# Patient Record
Sex: Female | Born: 1945
Health system: Southern US, Community
[De-identification: ages and names within clinical notes are randomized; demographics above are authoritative.]

## PROBLEM LIST (undated history)

## (undated) ENCOUNTER — Emergency Department (HOSPITAL_COMMUNITY): Admission: EM | Payer: Medicare Other | Source: Home / Self Care

## (undated) DIAGNOSIS — N183 Chronic kidney disease, stage 3 unspecified: Secondary | ICD-10-CM

## (undated) DIAGNOSIS — Z8673 Personal history of transient ischemic attack (TIA), and cerebral infarction without residual deficits: Secondary | ICD-10-CM

## (undated) DIAGNOSIS — F419 Anxiety disorder, unspecified: Secondary | ICD-10-CM

## (undated) DIAGNOSIS — E119 Type 2 diabetes mellitus without complications: Secondary | ICD-10-CM

## (undated) DIAGNOSIS — Z9889 Other specified postprocedural states: Secondary | ICD-10-CM

## (undated) DIAGNOSIS — Z87442 Personal history of urinary calculi: Secondary | ICD-10-CM

## (undated) DIAGNOSIS — D649 Anemia, unspecified: Secondary | ICD-10-CM

## (undated) DIAGNOSIS — H8109 Meniere's disease, unspecified ear: Secondary | ICD-10-CM

## (undated) DIAGNOSIS — I10 Essential (primary) hypertension: Secondary | ICD-10-CM

## (undated) DIAGNOSIS — J189 Pneumonia, unspecified organism: Secondary | ICD-10-CM

## (undated) DIAGNOSIS — E785 Hyperlipidemia, unspecified: Secondary | ICD-10-CM

## (undated) DIAGNOSIS — J45909 Unspecified asthma, uncomplicated: Secondary | ICD-10-CM

## (undated) DIAGNOSIS — I639 Cerebral infarction, unspecified: Secondary | ICD-10-CM

## (undated) DIAGNOSIS — F329 Major depressive disorder, single episode, unspecified: Secondary | ICD-10-CM

## (undated) DIAGNOSIS — G629 Polyneuropathy, unspecified: Secondary | ICD-10-CM

## (undated) DIAGNOSIS — I499 Cardiac arrhythmia, unspecified: Secondary | ICD-10-CM

## (undated) DIAGNOSIS — R112 Nausea with vomiting, unspecified: Secondary | ICD-10-CM

## (undated) DIAGNOSIS — K219 Gastro-esophageal reflux disease without esophagitis: Secondary | ICD-10-CM

## (undated) DIAGNOSIS — F32A Depression, unspecified: Secondary | ICD-10-CM

## (undated) HISTORY — PX: ECTOPIC PREGNANCY SURGERY: SHX613

## (undated) HISTORY — DX: Gastro-esophageal reflux disease without esophagitis: K21.9

## (undated) HISTORY — DX: Personal history of transient ischemic attack (TIA), and cerebral infarction without residual deficits: Z86.73

## (undated) HISTORY — DX: Essential (primary) hypertension: I10

## (undated) HISTORY — PX: TONSILLECTOMY: SUR1361

## (undated) HISTORY — DX: Cerebral infarction, unspecified: I63.9

## (undated) HISTORY — PX: ABDOMINAL HYSTERECTOMY: SHX81

## (undated) HISTORY — DX: Chronic kidney disease, stage 3 unspecified: N18.30

## (undated) HISTORY — PX: CHOLECYSTECTOMY: SHX55

## (undated) HISTORY — PX: BACK SURGERY: SHX140

## (undated) HISTORY — DX: Type 2 diabetes mellitus without complications: E11.9

## (undated) HISTORY — PX: OTHER SURGICAL HISTORY: SHX169

## (undated) HISTORY — DX: Hyperlipidemia, unspecified: E78.5

## (undated) HISTORY — DX: Major depressive disorder, single episode, unspecified: F32.9

## (undated) HISTORY — DX: Depression, unspecified: F32.A

## (undated) HISTORY — PX: CATARACT EXTRACTION, BILATERAL: SHX1313

## (undated) HISTORY — DX: Chronic kidney disease, stage 3 (moderate): N18.3

## (undated) HISTORY — DX: Polyneuropathy, unspecified: G62.9

---

## 2013-05-11 DIAGNOSIS — Z8673 Personal history of transient ischemic attack (TIA), and cerebral infarction without residual deficits: Secondary | ICD-10-CM

## 2013-05-11 HISTORY — DX: Personal history of transient ischemic attack (TIA), and cerebral infarction without residual deficits: Z86.73

## 2013-06-05 DIAGNOSIS — E782 Mixed hyperlipidemia: Secondary | ICD-10-CM | POA: Diagnosis not present

## 2013-06-05 DIAGNOSIS — E876 Hypokalemia: Secondary | ICD-10-CM | POA: Diagnosis not present

## 2013-06-05 DIAGNOSIS — IMO0001 Reserved for inherently not codable concepts without codable children: Secondary | ICD-10-CM | POA: Diagnosis not present

## 2013-06-13 DIAGNOSIS — IMO0001 Reserved for inherently not codable concepts without codable children: Secondary | ICD-10-CM | POA: Diagnosis not present

## 2013-06-13 DIAGNOSIS — Z23 Encounter for immunization: Secondary | ICD-10-CM | POA: Diagnosis not present

## 2013-06-13 DIAGNOSIS — IMO0002 Reserved for concepts with insufficient information to code with codable children: Secondary | ICD-10-CM | POA: Diagnosis not present

## 2013-06-13 DIAGNOSIS — Z1331 Encounter for screening for depression: Secondary | ICD-10-CM | POA: Diagnosis not present

## 2013-06-13 DIAGNOSIS — K2100 Gastro-esophageal reflux disease with esophagitis, without bleeding: Secondary | ICD-10-CM | POA: Diagnosis not present

## 2013-06-13 DIAGNOSIS — I10 Essential (primary) hypertension: Secondary | ICD-10-CM | POA: Diagnosis not present

## 2013-06-16 DIAGNOSIS — Z1231 Encounter for screening mammogram for malignant neoplasm of breast: Secondary | ICD-10-CM | POA: Diagnosis not present

## 2013-06-20 DIAGNOSIS — G459 Transient cerebral ischemic attack, unspecified: Secondary | ICD-10-CM | POA: Diagnosis not present

## 2013-06-22 DIAGNOSIS — G459 Transient cerebral ischemic attack, unspecified: Secondary | ICD-10-CM | POA: Diagnosis not present

## 2013-06-22 DIAGNOSIS — I658 Occlusion and stenosis of other precerebral arteries: Secondary | ICD-10-CM | POA: Diagnosis not present

## 2013-07-03 DIAGNOSIS — H3530 Unspecified macular degeneration: Secondary | ICD-10-CM | POA: Diagnosis not present

## 2013-07-03 DIAGNOSIS — H2510 Age-related nuclear cataract, unspecified eye: Secondary | ICD-10-CM | POA: Diagnosis not present

## 2013-07-03 DIAGNOSIS — I6350 Cerebral infarction due to unspecified occlusion or stenosis of unspecified cerebral artery: Secondary | ICD-10-CM | POA: Diagnosis not present

## 2013-07-03 DIAGNOSIS — H53469 Homonymous bilateral field defects, unspecified side: Secondary | ICD-10-CM | POA: Diagnosis not present

## 2013-07-03 DIAGNOSIS — E119 Type 2 diabetes mellitus without complications: Secondary | ICD-10-CM | POA: Diagnosis not present

## 2013-07-05 DIAGNOSIS — I6350 Cerebral infarction due to unspecified occlusion or stenosis of unspecified cerebral artery: Secondary | ICD-10-CM | POA: Diagnosis not present

## 2013-07-05 DIAGNOSIS — H53469 Homonymous bilateral field defects, unspecified side: Secondary | ICD-10-CM | POA: Diagnosis not present

## 2013-07-13 DIAGNOSIS — I6350 Cerebral infarction due to unspecified occlusion or stenosis of unspecified cerebral artery: Secondary | ICD-10-CM | POA: Diagnosis not present

## 2013-09-01 DIAGNOSIS — Z1331 Encounter for screening for depression: Secondary | ICD-10-CM | POA: Diagnosis not present

## 2013-09-01 DIAGNOSIS — I10 Essential (primary) hypertension: Secondary | ICD-10-CM | POA: Diagnosis not present

## 2013-09-01 DIAGNOSIS — IMO0001 Reserved for inherently not codable concepts without codable children: Secondary | ICD-10-CM | POA: Diagnosis not present

## 2013-09-01 DIAGNOSIS — K2100 Gastro-esophageal reflux disease with esophagitis, without bleeding: Secondary | ICD-10-CM | POA: Diagnosis not present

## 2013-09-01 DIAGNOSIS — IMO0002 Reserved for concepts with insufficient information to code with codable children: Secondary | ICD-10-CM | POA: Diagnosis not present

## 2013-09-01 DIAGNOSIS — Z23 Encounter for immunization: Secondary | ICD-10-CM | POA: Diagnosis not present

## 2013-12-05 DIAGNOSIS — IMO0002 Reserved for concepts with insufficient information to code with codable children: Secondary | ICD-10-CM | POA: Diagnosis not present

## 2013-12-05 DIAGNOSIS — E782 Mixed hyperlipidemia: Secondary | ICD-10-CM | POA: Diagnosis not present

## 2013-12-05 DIAGNOSIS — I10 Essential (primary) hypertension: Secondary | ICD-10-CM | POA: Diagnosis not present

## 2013-12-05 DIAGNOSIS — IMO0001 Reserved for inherently not codable concepts without codable children: Secondary | ICD-10-CM | POA: Diagnosis not present

## 2013-12-13 DIAGNOSIS — Z1331 Encounter for screening for depression: Secondary | ICD-10-CM | POA: Diagnosis not present

## 2013-12-13 DIAGNOSIS — I10 Essential (primary) hypertension: Secondary | ICD-10-CM | POA: Diagnosis not present

## 2013-12-13 DIAGNOSIS — IMO0001 Reserved for inherently not codable concepts without codable children: Secondary | ICD-10-CM | POA: Diagnosis not present

## 2013-12-13 DIAGNOSIS — K2100 Gastro-esophageal reflux disease with esophagitis, without bleeding: Secondary | ICD-10-CM | POA: Diagnosis not present

## 2013-12-13 DIAGNOSIS — S93609A Unspecified sprain of unspecified foot, initial encounter: Secondary | ICD-10-CM | POA: Diagnosis not present

## 2013-12-13 DIAGNOSIS — IMO0002 Reserved for concepts with insufficient information to code with codable children: Secondary | ICD-10-CM | POA: Diagnosis not present

## 2013-12-22 DIAGNOSIS — B029 Zoster without complications: Secondary | ICD-10-CM | POA: Diagnosis not present

## 2014-01-04 DIAGNOSIS — B0229 Other postherpetic nervous system involvement: Secondary | ICD-10-CM | POA: Diagnosis not present

## 2014-01-11 DIAGNOSIS — B0229 Other postherpetic nervous system involvement: Secondary | ICD-10-CM | POA: Diagnosis not present

## 2014-02-23 DIAGNOSIS — Z23 Encounter for immunization: Secondary | ICD-10-CM | POA: Diagnosis not present

## 2014-04-10 DIAGNOSIS — E1165 Type 2 diabetes mellitus with hyperglycemia: Secondary | ICD-10-CM | POA: Diagnosis not present

## 2014-04-10 DIAGNOSIS — E782 Mixed hyperlipidemia: Secondary | ICD-10-CM | POA: Diagnosis not present

## 2014-04-10 DIAGNOSIS — I10 Essential (primary) hypertension: Secondary | ICD-10-CM | POA: Diagnosis not present

## 2014-04-17 DIAGNOSIS — I10 Essential (primary) hypertension: Secondary | ICD-10-CM | POA: Diagnosis not present

## 2014-04-17 DIAGNOSIS — K210 Gastro-esophageal reflux disease with esophagitis: Secondary | ICD-10-CM | POA: Diagnosis not present

## 2014-04-17 DIAGNOSIS — E782 Mixed hyperlipidemia: Secondary | ICD-10-CM | POA: Diagnosis not present

## 2014-04-17 DIAGNOSIS — F324 Major depressive disorder, single episode, in partial remission: Secondary | ICD-10-CM | POA: Diagnosis not present

## 2014-04-17 DIAGNOSIS — Z9189 Other specified personal risk factors, not elsewhere classified: Secondary | ICD-10-CM | POA: Diagnosis not present

## 2014-04-17 DIAGNOSIS — E1165 Type 2 diabetes mellitus with hyperglycemia: Secondary | ICD-10-CM | POA: Diagnosis not present

## 2014-04-17 DIAGNOSIS — B0229 Other postherpetic nervous system involvement: Secondary | ICD-10-CM | POA: Diagnosis not present

## 2014-04-24 DIAGNOSIS — D1612 Benign neoplasm of short bones of left upper limb: Secondary | ICD-10-CM | POA: Diagnosis not present

## 2014-05-31 DIAGNOSIS — D1612 Benign neoplasm of short bones of left upper limb: Secondary | ICD-10-CM | POA: Diagnosis not present

## 2014-06-29 DIAGNOSIS — D1612 Benign neoplasm of short bones of left upper limb: Secondary | ICD-10-CM | POA: Diagnosis not present

## 2014-06-29 DIAGNOSIS — D1620 Benign neoplasm of long bones of unspecified lower limb: Secondary | ICD-10-CM | POA: Diagnosis not present

## 2014-06-29 DIAGNOSIS — E119 Type 2 diabetes mellitus without complications: Secondary | ICD-10-CM | POA: Diagnosis not present

## 2014-06-29 DIAGNOSIS — K219 Gastro-esophageal reflux disease without esophagitis: Secondary | ICD-10-CM | POA: Diagnosis not present

## 2014-06-29 DIAGNOSIS — Z8673 Personal history of transient ischemic attack (TIA), and cerebral infarction without residual deficits: Secondary | ICD-10-CM | POA: Diagnosis not present

## 2014-06-29 DIAGNOSIS — I10 Essential (primary) hypertension: Secondary | ICD-10-CM | POA: Diagnosis not present

## 2014-06-29 DIAGNOSIS — Z79899 Other long term (current) drug therapy: Secondary | ICD-10-CM | POA: Diagnosis not present

## 2014-06-29 DIAGNOSIS — R2232 Localized swelling, mass and lump, left upper limb: Secondary | ICD-10-CM | POA: Diagnosis not present

## 2014-06-29 DIAGNOSIS — Z8249 Family history of ischemic heart disease and other diseases of the circulatory system: Secondary | ICD-10-CM | POA: Diagnosis not present

## 2014-07-10 DIAGNOSIS — D1612 Benign neoplasm of short bones of left upper limb: Secondary | ICD-10-CM | POA: Diagnosis not present

## 2014-07-10 DIAGNOSIS — Z9889 Other specified postprocedural states: Secondary | ICD-10-CM | POA: Diagnosis not present

## 2014-07-19 DIAGNOSIS — Z1231 Encounter for screening mammogram for malignant neoplasm of breast: Secondary | ICD-10-CM | POA: Diagnosis not present

## 2014-07-23 ENCOUNTER — Encounter (INDEPENDENT_AMBULATORY_CARE_PROVIDER_SITE_OTHER): Payer: Medicare Other | Admitting: Ophthalmology

## 2014-07-23 DIAGNOSIS — E11331 Type 2 diabetes mellitus with moderate nonproliferative diabetic retinopathy with macular edema: Secondary | ICD-10-CM

## 2014-07-23 DIAGNOSIS — H3531 Nonexudative age-related macular degeneration: Secondary | ICD-10-CM

## 2014-07-23 DIAGNOSIS — I10 Essential (primary) hypertension: Secondary | ICD-10-CM | POA: Diagnosis not present

## 2014-07-23 DIAGNOSIS — E11311 Type 2 diabetes mellitus with unspecified diabetic retinopathy with macular edema: Secondary | ICD-10-CM | POA: Diagnosis not present

## 2014-07-23 DIAGNOSIS — H35033 Hypertensive retinopathy, bilateral: Secondary | ICD-10-CM | POA: Diagnosis not present

## 2014-08-01 ENCOUNTER — Other Ambulatory Visit (INDEPENDENT_AMBULATORY_CARE_PROVIDER_SITE_OTHER): Payer: Medicare Other | Admitting: Ophthalmology

## 2014-08-01 DIAGNOSIS — E11311 Type 2 diabetes mellitus with unspecified diabetic retinopathy with macular edema: Secondary | ICD-10-CM

## 2014-08-01 DIAGNOSIS — E11331 Type 2 diabetes mellitus with moderate nonproliferative diabetic retinopathy with macular edema: Secondary | ICD-10-CM

## 2014-08-16 DIAGNOSIS — D1612 Benign neoplasm of short bones of left upper limb: Secondary | ICD-10-CM | POA: Diagnosis not present

## 2014-09-06 DIAGNOSIS — E782 Mixed hyperlipidemia: Secondary | ICD-10-CM | POA: Diagnosis not present

## 2014-09-06 DIAGNOSIS — E1165 Type 2 diabetes mellitus with hyperglycemia: Secondary | ICD-10-CM | POA: Diagnosis not present

## 2014-09-06 DIAGNOSIS — K210 Gastro-esophageal reflux disease with esophagitis: Secondary | ICD-10-CM | POA: Diagnosis not present

## 2014-09-06 DIAGNOSIS — E876 Hypokalemia: Secondary | ICD-10-CM | POA: Diagnosis not present

## 2014-09-06 DIAGNOSIS — I10 Essential (primary) hypertension: Secondary | ICD-10-CM | POA: Diagnosis not present

## 2014-09-13 DIAGNOSIS — I10 Essential (primary) hypertension: Secondary | ICD-10-CM | POA: Diagnosis not present

## 2014-09-13 DIAGNOSIS — E1165 Type 2 diabetes mellitus with hyperglycemia: Secondary | ICD-10-CM | POA: Diagnosis not present

## 2014-09-13 DIAGNOSIS — F331 Major depressive disorder, recurrent, moderate: Secondary | ICD-10-CM | POA: Diagnosis not present

## 2014-09-13 DIAGNOSIS — Z0001 Encounter for general adult medical examination with abnormal findings: Secondary | ICD-10-CM | POA: Diagnosis not present

## 2014-09-13 DIAGNOSIS — E876 Hypokalemia: Secondary | ICD-10-CM | POA: Diagnosis not present

## 2014-09-13 DIAGNOSIS — Z1389 Encounter for screening for other disorder: Secondary | ICD-10-CM | POA: Diagnosis not present

## 2014-09-13 DIAGNOSIS — K219 Gastro-esophageal reflux disease without esophagitis: Secondary | ICD-10-CM | POA: Diagnosis not present

## 2014-09-13 DIAGNOSIS — Z9189 Other specified personal risk factors, not elsewhere classified: Secondary | ICD-10-CM | POA: Diagnosis not present

## 2014-09-13 DIAGNOSIS — Z23 Encounter for immunization: Secondary | ICD-10-CM | POA: Diagnosis not present

## 2014-09-13 DIAGNOSIS — E782 Mixed hyperlipidemia: Secondary | ICD-10-CM | POA: Diagnosis not present

## 2014-12-03 ENCOUNTER — Ambulatory Visit (INDEPENDENT_AMBULATORY_CARE_PROVIDER_SITE_OTHER): Payer: Medicare Other | Admitting: Ophthalmology

## 2014-12-03 DIAGNOSIS — I10 Essential (primary) hypertension: Secondary | ICD-10-CM

## 2014-12-03 DIAGNOSIS — E11339 Type 2 diabetes mellitus with moderate nonproliferative diabetic retinopathy without macular edema: Secondary | ICD-10-CM

## 2014-12-03 DIAGNOSIS — H35033 Hypertensive retinopathy, bilateral: Secondary | ICD-10-CM | POA: Diagnosis not present

## 2014-12-03 DIAGNOSIS — H2513 Age-related nuclear cataract, bilateral: Secondary | ICD-10-CM

## 2014-12-03 DIAGNOSIS — H43813 Vitreous degeneration, bilateral: Secondary | ICD-10-CM | POA: Diagnosis not present

## 2014-12-03 DIAGNOSIS — E11319 Type 2 diabetes mellitus with unspecified diabetic retinopathy without macular edema: Secondary | ICD-10-CM | POA: Diagnosis not present

## 2014-12-11 DIAGNOSIS — E782 Mixed hyperlipidemia: Secondary | ICD-10-CM | POA: Diagnosis not present

## 2014-12-11 DIAGNOSIS — E1165 Type 2 diabetes mellitus with hyperglycemia: Secondary | ICD-10-CM | POA: Diagnosis not present

## 2014-12-11 DIAGNOSIS — I10 Essential (primary) hypertension: Secondary | ICD-10-CM | POA: Diagnosis not present

## 2014-12-11 DIAGNOSIS — E876 Hypokalemia: Secondary | ICD-10-CM | POA: Diagnosis not present

## 2014-12-19 DIAGNOSIS — N183 Chronic kidney disease, stage 3 (moderate): Secondary | ICD-10-CM | POA: Diagnosis not present

## 2014-12-19 DIAGNOSIS — E782 Mixed hyperlipidemia: Secondary | ICD-10-CM | POA: Diagnosis not present

## 2014-12-19 DIAGNOSIS — K219 Gastro-esophageal reflux disease without esophagitis: Secondary | ICD-10-CM | POA: Diagnosis not present

## 2014-12-19 DIAGNOSIS — I10 Essential (primary) hypertension: Secondary | ICD-10-CM | POA: Diagnosis not present

## 2014-12-19 DIAGNOSIS — E1122 Type 2 diabetes mellitus with diabetic chronic kidney disease: Secondary | ICD-10-CM | POA: Diagnosis not present

## 2014-12-19 DIAGNOSIS — F331 Major depressive disorder, recurrent, moderate: Secondary | ICD-10-CM | POA: Diagnosis not present

## 2015-01-10 DIAGNOSIS — M85852 Other specified disorders of bone density and structure, left thigh: Secondary | ICD-10-CM | POA: Diagnosis not present

## 2015-01-10 DIAGNOSIS — M810 Age-related osteoporosis without current pathological fracture: Secondary | ICD-10-CM | POA: Diagnosis not present

## 2015-01-10 DIAGNOSIS — Z79899 Other long term (current) drug therapy: Secondary | ICD-10-CM | POA: Diagnosis not present

## 2015-01-10 DIAGNOSIS — Z8262 Family history of osteoporosis: Secondary | ICD-10-CM | POA: Diagnosis not present

## 2015-01-10 DIAGNOSIS — Z7982 Long term (current) use of aspirin: Secondary | ICD-10-CM | POA: Diagnosis not present

## 2015-01-10 DIAGNOSIS — Z780 Asymptomatic menopausal state: Secondary | ICD-10-CM | POA: Diagnosis not present

## 2015-01-10 DIAGNOSIS — M8589 Other specified disorders of bone density and structure, multiple sites: Secondary | ICD-10-CM | POA: Diagnosis not present

## 2015-01-10 DIAGNOSIS — E119 Type 2 diabetes mellitus without complications: Secondary | ICD-10-CM | POA: Diagnosis not present

## 2015-01-10 DIAGNOSIS — M85851 Other specified disorders of bone density and structure, right thigh: Secondary | ICD-10-CM | POA: Diagnosis not present

## 2015-03-27 DIAGNOSIS — Z23 Encounter for immunization: Secondary | ICD-10-CM | POA: Diagnosis not present

## 2015-04-19 DIAGNOSIS — K210 Gastro-esophageal reflux disease with esophagitis: Secondary | ICD-10-CM | POA: Diagnosis not present

## 2015-04-19 DIAGNOSIS — N183 Chronic kidney disease, stage 3 (moderate): Secondary | ICD-10-CM | POA: Diagnosis not present

## 2015-04-19 DIAGNOSIS — E782 Mixed hyperlipidemia: Secondary | ICD-10-CM | POA: Diagnosis not present

## 2015-04-19 DIAGNOSIS — I10 Essential (primary) hypertension: Secondary | ICD-10-CM | POA: Diagnosis not present

## 2015-04-19 DIAGNOSIS — E1122 Type 2 diabetes mellitus with diabetic chronic kidney disease: Secondary | ICD-10-CM | POA: Diagnosis not present

## 2015-04-19 DIAGNOSIS — E876 Hypokalemia: Secondary | ICD-10-CM | POA: Diagnosis not present

## 2015-04-26 DIAGNOSIS — N183 Chronic kidney disease, stage 3 (moderate): Secondary | ICD-10-CM | POA: Diagnosis not present

## 2015-04-26 DIAGNOSIS — E782 Mixed hyperlipidemia: Secondary | ICD-10-CM | POA: Diagnosis not present

## 2015-04-26 DIAGNOSIS — F331 Major depressive disorder, recurrent, moderate: Secondary | ICD-10-CM | POA: Diagnosis not present

## 2015-04-26 DIAGNOSIS — I10 Essential (primary) hypertension: Secondary | ICD-10-CM | POA: Diagnosis not present

## 2015-04-26 DIAGNOSIS — K219 Gastro-esophageal reflux disease without esophagitis: Secondary | ICD-10-CM | POA: Diagnosis not present

## 2015-04-26 DIAGNOSIS — E1122 Type 2 diabetes mellitus with diabetic chronic kidney disease: Secondary | ICD-10-CM | POA: Diagnosis not present

## 2015-05-19 DIAGNOSIS — Z7984 Long term (current) use of oral hypoglycemic drugs: Secondary | ICD-10-CM | POA: Diagnosis not present

## 2015-05-19 DIAGNOSIS — D649 Anemia, unspecified: Secondary | ICD-10-CM | POA: Diagnosis present

## 2015-05-19 DIAGNOSIS — J158 Pneumonia due to other specified bacteria: Secondary | ICD-10-CM | POA: Diagnosis not present

## 2015-05-19 DIAGNOSIS — R000 Tachycardia, unspecified: Secondary | ICD-10-CM | POA: Diagnosis not present

## 2015-05-19 DIAGNOSIS — I10 Essential (primary) hypertension: Secondary | ICD-10-CM | POA: Diagnosis present

## 2015-05-19 DIAGNOSIS — E119 Type 2 diabetes mellitus without complications: Secondary | ICD-10-CM | POA: Diagnosis not present

## 2015-05-19 DIAGNOSIS — E785 Hyperlipidemia, unspecified: Secondary | ICD-10-CM | POA: Diagnosis present

## 2015-05-19 DIAGNOSIS — I447 Left bundle-branch block, unspecified: Secondary | ICD-10-CM | POA: Diagnosis not present

## 2015-05-19 DIAGNOSIS — R05 Cough: Secondary | ICD-10-CM | POA: Diagnosis not present

## 2015-05-19 DIAGNOSIS — E876 Hypokalemia: Secondary | ICD-10-CM | POA: Diagnosis not present

## 2015-05-19 DIAGNOSIS — J189 Pneumonia, unspecified organism: Secondary | ICD-10-CM | POA: Diagnosis not present

## 2015-05-19 DIAGNOSIS — K219 Gastro-esophageal reflux disease without esophagitis: Secondary | ICD-10-CM | POA: Diagnosis present

## 2015-05-19 DIAGNOSIS — J40 Bronchitis, not specified as acute or chronic: Secondary | ICD-10-CM | POA: Diagnosis not present

## 2015-05-19 DIAGNOSIS — Z7982 Long term (current) use of aspirin: Secondary | ICD-10-CM | POA: Diagnosis not present

## 2015-05-19 DIAGNOSIS — Z79899 Other long term (current) drug therapy: Secondary | ICD-10-CM | POA: Diagnosis not present

## 2015-05-19 DIAGNOSIS — Z8673 Personal history of transient ischemic attack (TIA), and cerebral infarction without residual deficits: Secondary | ICD-10-CM | POA: Diagnosis not present

## 2015-05-19 DIAGNOSIS — R509 Fever, unspecified: Secondary | ICD-10-CM | POA: Diagnosis not present

## 2015-05-19 DIAGNOSIS — E871 Hypo-osmolality and hyponatremia: Secondary | ICD-10-CM | POA: Diagnosis not present

## 2015-05-19 DIAGNOSIS — R0602 Shortness of breath: Secondary | ICD-10-CM | POA: Diagnosis not present

## 2015-06-05 ENCOUNTER — Ambulatory Visit (INDEPENDENT_AMBULATORY_CARE_PROVIDER_SITE_OTHER): Payer: Medicare Other | Admitting: Ophthalmology

## 2015-06-05 DIAGNOSIS — J13 Pneumonia due to Streptococcus pneumoniae: Secondary | ICD-10-CM | POA: Diagnosis not present

## 2015-06-05 DIAGNOSIS — E871 Hypo-osmolality and hyponatremia: Secondary | ICD-10-CM | POA: Diagnosis not present

## 2015-06-05 DIAGNOSIS — E876 Hypokalemia: Secondary | ICD-10-CM | POA: Diagnosis not present

## 2015-06-13 ENCOUNTER — Ambulatory Visit (INDEPENDENT_AMBULATORY_CARE_PROVIDER_SITE_OTHER): Payer: Medicare Other | Admitting: Ophthalmology

## 2015-06-13 DIAGNOSIS — H2513 Age-related nuclear cataract, bilateral: Secondary | ICD-10-CM

## 2015-06-13 DIAGNOSIS — E113311 Type 2 diabetes mellitus with moderate nonproliferative diabetic retinopathy with macular edema, right eye: Secondary | ICD-10-CM | POA: Diagnosis not present

## 2015-06-13 DIAGNOSIS — H43813 Vitreous degeneration, bilateral: Secondary | ICD-10-CM

## 2015-06-13 DIAGNOSIS — E11311 Type 2 diabetes mellitus with unspecified diabetic retinopathy with macular edema: Secondary | ICD-10-CM | POA: Diagnosis not present

## 2015-06-13 DIAGNOSIS — H35033 Hypertensive retinopathy, bilateral: Secondary | ICD-10-CM

## 2015-06-13 DIAGNOSIS — I10 Essential (primary) hypertension: Secondary | ICD-10-CM | POA: Diagnosis not present

## 2015-06-13 DIAGNOSIS — H353112 Nonexudative age-related macular degeneration, right eye, intermediate dry stage: Secondary | ICD-10-CM

## 2015-06-24 DIAGNOSIS — J13 Pneumonia due to Streptococcus pneumoniae: Secondary | ICD-10-CM | POA: Diagnosis not present

## 2015-06-24 DIAGNOSIS — E871 Hypo-osmolality and hyponatremia: Secondary | ICD-10-CM | POA: Diagnosis not present

## 2015-06-24 DIAGNOSIS — E876 Hypokalemia: Secondary | ICD-10-CM | POA: Diagnosis not present

## 2015-07-05 DIAGNOSIS — E1122 Type 2 diabetes mellitus with diabetic chronic kidney disease: Secondary | ICD-10-CM | POA: Diagnosis not present

## 2015-07-11 DIAGNOSIS — E113391 Type 2 diabetes mellitus with moderate nonproliferative diabetic retinopathy without macular edema, right eye: Secondary | ICD-10-CM | POA: Diagnosis not present

## 2015-07-11 DIAGNOSIS — Z794 Long term (current) use of insulin: Secondary | ICD-10-CM | POA: Diagnosis not present

## 2015-07-11 DIAGNOSIS — H353112 Nonexudative age-related macular degeneration, right eye, intermediate dry stage: Secondary | ICD-10-CM | POA: Diagnosis not present

## 2015-07-11 DIAGNOSIS — E113392 Type 2 diabetes mellitus with moderate nonproliferative diabetic retinopathy without macular edema, left eye: Secondary | ICD-10-CM | POA: Diagnosis not present

## 2015-07-11 DIAGNOSIS — H25012 Cortical age-related cataract, left eye: Secondary | ICD-10-CM | POA: Diagnosis not present

## 2015-07-11 DIAGNOSIS — H31012 Macula scars of posterior pole (postinflammatory) (post-traumatic), left eye: Secondary | ICD-10-CM | POA: Diagnosis not present

## 2015-07-11 DIAGNOSIS — H2512 Age-related nuclear cataract, left eye: Secondary | ICD-10-CM | POA: Diagnosis not present

## 2015-08-20 DIAGNOSIS — H2512 Age-related nuclear cataract, left eye: Secondary | ICD-10-CM | POA: Diagnosis not present

## 2015-08-22 DIAGNOSIS — E1122 Type 2 diabetes mellitus with diabetic chronic kidney disease: Secondary | ICD-10-CM | POA: Diagnosis not present

## 2015-08-22 DIAGNOSIS — E871 Hypo-osmolality and hyponatremia: Secondary | ICD-10-CM | POA: Diagnosis not present

## 2015-08-22 DIAGNOSIS — I10 Essential (primary) hypertension: Secondary | ICD-10-CM | POA: Diagnosis not present

## 2015-08-22 DIAGNOSIS — E876 Hypokalemia: Secondary | ICD-10-CM | POA: Diagnosis not present

## 2015-08-22 DIAGNOSIS — E782 Mixed hyperlipidemia: Secondary | ICD-10-CM | POA: Diagnosis not present

## 2015-08-22 DIAGNOSIS — K210 Gastro-esophageal reflux disease with esophagitis: Secondary | ICD-10-CM | POA: Diagnosis not present

## 2015-08-22 LAB — HEMOGLOBIN A1C: Hemoglobin A1C: 8.4

## 2015-08-27 DIAGNOSIS — F331 Major depressive disorder, recurrent, moderate: Secondary | ICD-10-CM | POA: Diagnosis not present

## 2015-08-27 DIAGNOSIS — E782 Mixed hyperlipidemia: Secondary | ICD-10-CM | POA: Diagnosis not present

## 2015-08-27 DIAGNOSIS — I10 Essential (primary) hypertension: Secondary | ICD-10-CM | POA: Diagnosis not present

## 2015-08-27 DIAGNOSIS — E1122 Type 2 diabetes mellitus with diabetic chronic kidney disease: Secondary | ICD-10-CM | POA: Diagnosis not present

## 2015-08-27 DIAGNOSIS — K219 Gastro-esophageal reflux disease without esophagitis: Secondary | ICD-10-CM | POA: Diagnosis not present

## 2015-08-27 DIAGNOSIS — N183 Chronic kidney disease, stage 3 (moderate): Secondary | ICD-10-CM | POA: Diagnosis not present

## 2015-09-06 DIAGNOSIS — Z1231 Encounter for screening mammogram for malignant neoplasm of breast: Secondary | ICD-10-CM | POA: Diagnosis not present

## 2015-09-19 DIAGNOSIS — A084 Viral intestinal infection, unspecified: Secondary | ICD-10-CM | POA: Diagnosis not present

## 2015-09-19 DIAGNOSIS — I10 Essential (primary) hypertension: Secondary | ICD-10-CM | POA: Diagnosis not present

## 2015-09-19 DIAGNOSIS — F329 Major depressive disorder, single episode, unspecified: Secondary | ICD-10-CM | POA: Diagnosis not present

## 2015-09-19 DIAGNOSIS — Z79899 Other long term (current) drug therapy: Secondary | ICD-10-CM | POA: Diagnosis not present

## 2015-09-19 DIAGNOSIS — E1142 Type 2 diabetes mellitus with diabetic polyneuropathy: Secondary | ICD-10-CM | POA: Diagnosis not present

## 2015-09-19 DIAGNOSIS — Z7984 Long term (current) use of oral hypoglycemic drugs: Secondary | ICD-10-CM | POA: Diagnosis not present

## 2015-09-19 DIAGNOSIS — Z7982 Long term (current) use of aspirin: Secondary | ICD-10-CM | POA: Diagnosis not present

## 2015-09-19 DIAGNOSIS — Z8673 Personal history of transient ischemic attack (TIA), and cerebral infarction without residual deficits: Secondary | ICD-10-CM | POA: Diagnosis not present

## 2015-09-19 DIAGNOSIS — H2511 Age-related nuclear cataract, right eye: Secondary | ICD-10-CM | POA: Diagnosis not present

## 2015-09-19 DIAGNOSIS — E785 Hyperlipidemia, unspecified: Secondary | ICD-10-CM | POA: Diagnosis not present

## 2015-09-19 DIAGNOSIS — D649 Anemia, unspecified: Secondary | ICD-10-CM | POA: Diagnosis not present

## 2015-09-19 DIAGNOSIS — R112 Nausea with vomiting, unspecified: Secondary | ICD-10-CM | POA: Diagnosis not present

## 2015-09-19 DIAGNOSIS — K219 Gastro-esophageal reflux disease without esophagitis: Secondary | ICD-10-CM | POA: Diagnosis not present

## 2015-09-19 DIAGNOSIS — H25011 Cortical age-related cataract, right eye: Secondary | ICD-10-CM | POA: Diagnosis not present

## 2015-09-19 DIAGNOSIS — E11649 Type 2 diabetes mellitus with hypoglycemia without coma: Secondary | ICD-10-CM | POA: Diagnosis not present

## 2015-09-19 DIAGNOSIS — Z794 Long term (current) use of insulin: Secondary | ICD-10-CM | POA: Diagnosis not present

## 2015-09-20 DIAGNOSIS — E11649 Type 2 diabetes mellitus with hypoglycemia without coma: Secondary | ICD-10-CM | POA: Diagnosis not present

## 2015-10-21 DIAGNOSIS — E1122 Type 2 diabetes mellitus with diabetic chronic kidney disease: Secondary | ICD-10-CM | POA: Diagnosis not present

## 2015-11-06 ENCOUNTER — Ambulatory Visit (INDEPENDENT_AMBULATORY_CARE_PROVIDER_SITE_OTHER): Payer: Medicare Other | Admitting: "Endocrinology

## 2015-11-06 ENCOUNTER — Encounter: Payer: Self-pay | Admitting: "Endocrinology

## 2015-11-06 VITALS — BP 147/77 | HR 74 | Ht 65.5 in | Wt 173.0 lb

## 2015-11-06 DIAGNOSIS — E785 Hyperlipidemia, unspecified: Secondary | ICD-10-CM | POA: Insufficient documentation

## 2015-11-06 DIAGNOSIS — I10 Essential (primary) hypertension: Secondary | ICD-10-CM

## 2015-11-06 DIAGNOSIS — E1159 Type 2 diabetes mellitus with other circulatory complications: Secondary | ICD-10-CM

## 2015-11-06 MED ORDER — GLUCOSE BLOOD VI STRP: ORAL_STRIP | Status: DC

## 2015-11-06 NOTE — Progress Notes (Signed)
Subjective:    Patient ID: Angela Harris, female    DOB: 1945/05/16. Patient is being seen in consultation for management of diabetes requested by  Gar Ponto, MD  Past Medical History  Diagnosis Date  . Diabetes mellitus, type II ([place])   . Stroke ([place])   . Hypertension    Past Surgical History  Procedure Laterality Date  . Back surgery    . Cholecystectomy    . Tonsillectomy    . Abdominal hysterectomy     Social History   Social History  . Marital Status: Divorced    Spouse Name: N/A  . Number of Children: N/A  . Years of Education: N/A   Social History Main Topics  . Smoking status: Never Smoker   . Smokeless tobacco: None  . Alcohol Use: No  . Drug Use: No  . Sexual Activity: Not Asked   Other Topics Concern  . None   Social History Narrative  . None   Outpatient Encounter Prescriptions as of 11/06/2015  Medication Sig  . amLODipine (NORVASC) 10 MG tablet Take 10 mg by mouth daily.  Marland Kitchen aspirin 81 MG tablet Take 81 mg by mouth daily.  Marland Kitchen atenolol (TENORMIN) 50 MG tablet Take 50 mg by mouth daily.  . Calcium Citrate-Vitamin D (CALCIUM + D PO) Take by mouth.  . gabapentin (NEURONTIN) 300 MG capsule Take 300 mg by mouth 2 (two) times daily.  . insulin NPH-regular Human (NOVOLIN 70/30) (70-30) 100 UNIT/ML injection Inject 30 Units into the skin 2 (two) times daily with a meal.  . lisinopril-hydrochlorothiazide (PRINZIDE,ZESTORETIC) 20-12.5 MG tablet Take 1 tablet by mouth daily.  . metFORMIN (GLUCOPHAGE) 500 MG tablet Take 500 mg by mouth 2 (two) times daily with a meal.  . pantoprazole (PROTONIX) 40 MG tablet Take 40 mg by mouth daily.  . simvastatin (ZOCOR) 40 MG tablet Take 40 mg by mouth daily.  Marland Kitchen venlafaxine XR (EFFEXOR-XR) 75 MG 24 hr capsule Take 75 mg by mouth daily with breakfast.  . glucose blood (ONE TOUCH ULTRA TEST) test strip Use as instructed   No facility-administered encounter medications on file as of 11/06/2015.   ALLERGIES: No Known  Allergies VACCINATION STATUS:  There is no immunization history on file for this patient.  Diabetes She presents for her initial diabetic visit. She has type 2 diabetes mellitus. Onset time: She was diagnosed at approximate age of 46 years. Her disease course has been worsening. There are no hypoglycemic associated symptoms. Pertinent negatives for hypoglycemia include no confusion, headaches, pallor or seizures. Associated symptoms include fatigue, polydipsia and polyuria. Pertinent negatives for diabetes include no chest pain and no polyphagia. There are no hypoglycemic complications. Symptoms are worsening. Diabetic complications include a CVA. Risk factors for coronary artery disease include diabetes mellitus, dyslipidemia, hypertension and sedentary lifestyle. Current diabetic treatment includes insulin injections and oral agent (monotherapy). She is compliant with treatment some of the time. Her weight is increasing steadily. She is following a generally unhealthy diet. When asked about meal planning, she reported none. She has not had a previous visit with a dietitian. Blood glucose monitoring compliance is inadequate. Home blood sugar record trend: She did not bring her meter nor logs to review today. An ACE inhibitor/angiotensin II receptor blocker is being taken.  Hyperlipidemia This is a chronic problem. The current episode started more than 1 year ago. Exacerbating diseases include diabetes. Pertinent negatives include no chest pain, myalgias or shortness of breath. Current antihyperlipidemic treatment includes statins. Risk  factors for coronary artery disease include diabetes mellitus, dyslipidemia and hypertension.  Hypertension This is a chronic problem. The current episode started more than 1 year ago. Pertinent negatives include no chest pain, headaches, palpitations or shortness of breath. Risk factors for coronary artery disease include diabetes mellitus, dyslipidemia and sedentary  lifestyle. Past treatments include ACE inhibitors. Hypertensive end-organ damage includes CVA.      Review of Systems  Constitutional: Positive for fatigue. Negative for fever, chills and unexpected weight change.  HENT: Negative for trouble swallowing and voice change.   Eyes: Negative for visual disturbance.  Respiratory: Negative for cough, shortness of breath and wheezing.   Cardiovascular: Negative for chest pain, palpitations and leg swelling.  Gastrointestinal: Negative for nausea, vomiting and diarrhea.  Endocrine: Positive for polydipsia and polyuria. Negative for cold intolerance, heat intolerance and polyphagia.  Musculoskeletal: Negative for myalgias and arthralgias.  Skin: Negative for color change, pallor, rash and wound.  Neurological: Negative for seizures and headaches.  Psychiatric/Behavioral: Negative for suicidal ideas and confusion.    Objective:    BP 147/77 mmHg  Pulse 74  Ht 5' 5.5" (1.664 m)  Wt 173 lb (78.472 kg)  BMI 28.34 kg/m2  Wt Readings from Last 3 Encounters:  11/06/15 173 lb (78.472 kg)    Physical Exam  Constitutional: She is oriented to person, place, and time. She appears well-developed.  HENT:  Head: Normocephalic and atraumatic.  Eyes: EOM are normal.  Neck: Normal range of motion. Neck supple. No tracheal deviation present. No thyromegaly present.  Cardiovascular: Normal rate and regular rhythm.   Pulmonary/Chest: Effort normal and breath sounds normal.  Abdominal: Soft. Bowel sounds are normal. There is no tenderness. There is no guarding.  Musculoskeletal: Normal range of motion. She exhibits no edema.  Neurological: She is alert and oriented to person, place, and time. She has normal reflexes. No cranial nerve deficit. Coordination normal.  Skin: Skin is warm and dry. No rash noted. No erythema. No pallor.  Psychiatric: She has a normal mood and affect. Judgment normal.     Assessment & Plan:   1. Type 2 diabetes mellitus with  vascular disease ([place])   - Patient has currently uncontrolled symptomatic type 2 DM since  70 years of age,  with most recent A1c of 8.4 %. Recent labs reviewed.   Her diabetes is complicated by CVA and patient remains at a high risk for more acute and chronic complications of diabetes which include CAD, CVA, CKD, retinopathy, and neuropathy. These are all discussed in detail with the patient.  - I have counseled the patient on diet management and weight loss, by adopting a carbohydrate restricted/protein rich diet.  - Suggestion is made for patient to avoid simple carbohydrates   from their diet including Cakes , Desserts, Ice Cream,  Soda (  diet and regular) , Sweet Tea , Candies,  Chips, Cookies, Artificial Sweeteners,   and "Sugar-free" Products . This will help patient to have stable blood glucose profile and potentially avoid unintended weight gain.  - I encouraged the patient to switch to  unprocessed or minimally processed complex starch and increased protein intake (animal or plant source), fruits, and vegetables.  - Patient is advised to stick to a routine mealtimes to eat 3 meals  a day and avoid unnecessary snacks ( to snack only to correct hypoglycemia).  - The patient will be scheduled with Jearld Fenton, RDN, CDE for individualized DM education.  - I have approached patient with the following  individualized plan to manage diabetes and patient agrees:   - She will be allowed to continue to use her premixed insulin, Novolin 70/30, which reportedly was chosen due to cost issues. -  I  will continue Novolin 70/30   30 units with breakfast and 30 units with supper for pre-meal blood glucose above 90 mg/dL, associated with monitoring of blood glucose before meals and at bedtime for one week and return with meter and logs for reevaluation.   - Patient is warned not to take insulin without proper monitoring per orders. -Adjustment parameters are given for hypo and hyperglycemia in  writing. -Patient is encouraged to call clinic for blood glucose levels less than 70 or above 300 mg /dl. - I will  lower metformin to 500 mg by mouth twice a day due to mild CK D,  therapeutically suitable for patient..  - If her glycemic profile is significantly above target, she will be considered for insulin analogs for basal/bolus utilization.   - Patient specific target  A1c;  LDL, HDL, Triglycerides, and  Waist Circumference were discussed in detail.  2) BP/HTN: Controlled. Continue current medications including ACEI/ARB. 3) Lipids/HPL:  Controlled with LDL 47, continue statins. 4)  Weight/Diet: CDE Consult will be initiated , exercise, and detailed carbohydrates information provided.  5) Chronic Care/Health Maintenance:  -Patient is on ACEI/ARB and Statin medications and encouraged to continue to follow up with Ophthalmology, Podiatrist at least yearly or according to recommendations, and advised to   stay away from smoking. I have recommended yearly flu vaccine and pneumonia vaccination at least every 5 years; moderate intensity exercise for up to 150 minutes weekly; and  sleep for at least 7 hours a day.  - 60 minutes of time was spent on the care of this patient , 50% of which was applied for counseling on diabetes complications and their preventions.  - Patient to bring meter and  blood glucose logs during their next visit.   - I advised patient to maintain close follow up with Gar Ponto, MD for primary care needs.  Follow up plan: - Return in about 1 week (around 11/13/2015) for follow up with meter and logs- no labs.  Glade Lloyd, MD Phone: [phone]  Fax: [phone]   11/06/2015, 4:03 PM

## 2015-11-06 NOTE — Patient Instructions (Signed)

## 2015-11-08 DIAGNOSIS — J988 Other specified respiratory disorders: Secondary | ICD-10-CM | POA: Diagnosis not present

## 2015-11-08 DIAGNOSIS — R05 Cough: Secondary | ICD-10-CM | POA: Diagnosis not present

## 2015-11-15 ENCOUNTER — Encounter: Payer: Self-pay | Admitting: "Endocrinology

## 2015-11-15 ENCOUNTER — Ambulatory Visit (INDEPENDENT_AMBULATORY_CARE_PROVIDER_SITE_OTHER): Payer: Medicare Other | Admitting: "Endocrinology

## 2015-11-15 VITALS — BP 130/68 | HR 80 | Ht 65.5 in | Wt 174.0 lb

## 2015-11-15 DIAGNOSIS — E1159 Type 2 diabetes mellitus with other circulatory complications: Secondary | ICD-10-CM | POA: Diagnosis not present

## 2015-11-15 DIAGNOSIS — E785 Hyperlipidemia, unspecified: Secondary | ICD-10-CM | POA: Diagnosis not present

## 2015-11-15 DIAGNOSIS — I10 Essential (primary) hypertension: Secondary | ICD-10-CM | POA: Diagnosis not present

## 2015-11-15 NOTE — Progress Notes (Signed)
Subjective:    Patient ID: Angela Harris, female    DOB: Jun 05, 1945. Patient is being seen in f/u for management of diabetes requested by  Gar Ponto, MD  Past Medical History  Diagnosis Date  . Diabetes mellitus, type II ([place])   . Stroke ([place])   . Hypertension    Past Surgical History  Procedure Laterality Date  . Back surgery    . Cholecystectomy    . Tonsillectomy    . Abdominal hysterectomy     Social History   Social History  . Marital Status: Divorced    Spouse Name: N/A  . Number of Children: N/A  . Years of Education: N/A   Social History Main Topics  . Smoking status: Never Smoker   . Smokeless tobacco: None  . Alcohol Use: No  . Drug Use: No  . Sexual Activity: Not Asked   Other Topics Concern  . None   Social History Narrative   Outpatient Encounter Prescriptions as of 11/15/2015  Medication Sig  . amLODipine (NORVASC) 10 MG tablet Take 10 mg by mouth daily.  Marland Kitchen aspirin 81 MG tablet Take 81 mg by mouth daily.  Marland Kitchen atenolol (TENORMIN) 50 MG tablet Take 50 mg by mouth daily.  . Calcium Citrate-Vitamin D (CALCIUM + D PO) Take by mouth.  . gabapentin (NEURONTIN) 300 MG capsule Take 300 mg by mouth 2 (two) times daily.  Marland Kitchen glucose blood (ONE TOUCH ULTRA TEST) test strip Use as instructed  . insulin NPH-regular Human (NOVOLIN 70/30) (70-30) 100 UNIT/ML injection Inject 30-40 Units into the skin 2 (two) times daily with a meal. Inject 40 units with breakfast and 30 units with supper  . lisinopril-hydrochlorothiazide (PRINZIDE,ZESTORETIC) 20-12.5 MG tablet Take 2 tablets by mouth daily.   . metFORMIN (GLUCOPHAGE) 500 MG tablet Take 500 mg by mouth 2 (two) times daily with a meal.  . pantoprazole (PROTONIX) 40 MG tablet Take 40 mg by mouth 2 (two) times daily.   . simvastatin (ZOCOR) 40 MG tablet Take 40 mg by mouth daily.  Marland Kitchen venlafaxine XR (EFFEXOR-XR) 75 MG 24 hr capsule Take 75 mg by mouth daily with breakfast.   No facility-administered encounter  medications on file as of 11/15/2015.   ALLERGIES: No Known Allergies VACCINATION STATUS:  There is no immunization history on file for this patient.  Diabetes She presents for her follow-up diabetic visit. She has type 2 diabetes mellitus. Onset time: She was diagnosed at approximate age of 6 years. Her disease course has been improving. There are no hypoglycemic associated symptoms. Pertinent negatives for hypoglycemia include no confusion, headaches, pallor or seizures. Associated symptoms include fatigue, polydipsia and polyuria. Pertinent negatives for diabetes include no chest pain and no polyphagia. There are no hypoglycemic complications. Symptoms are improving. Diabetic complications include a CVA. Risk factors for coronary artery disease include diabetes mellitus, dyslipidemia, hypertension and sedentary lifestyle. Current diabetic treatment includes insulin injections and oral agent (monotherapy). She is compliant with treatment some of the time. Her weight is stable. She is following a generally unhealthy diet. When asked about meal planning, she reported none. She has not had a previous visit with a dietitian. Blood glucose monitoring compliance is inadequate. Her breakfast blood glucose range is generally 140-180 mg/dl. Her lunch blood glucose range is generally 140-180 mg/dl. Her dinner blood glucose range is generally 180-200 mg/dl. Her overall blood glucose range is 180-200 mg/dl. An ACE inhibitor/angiotensin II receptor blocker is being taken.  Hyperlipidemia This is a chronic problem.  The current episode started more than 1 year ago. Exacerbating diseases include diabetes. Pertinent negatives include no chest pain, myalgias or shortness of breath. Current antihyperlipidemic treatment includes statins. Risk factors for coronary artery disease include diabetes mellitus, dyslipidemia and hypertension.  Hypertension This is a chronic problem. The current episode started more than 1 year  ago. Pertinent negatives include no chest pain, headaches, palpitations or shortness of breath. Risk factors for coronary artery disease include diabetes mellitus, dyslipidemia and sedentary lifestyle. Past treatments include ACE inhibitors. Hypertensive end-organ damage includes CVA.      Review of Systems  Constitutional: Positive for fatigue. Negative for fever, chills and unexpected weight change.  HENT: Negative for trouble swallowing and voice change.   Eyes: Negative for visual disturbance.  Respiratory: Negative for cough, shortness of breath and wheezing.   Cardiovascular: Negative for chest pain, palpitations and leg swelling.  Gastrointestinal: Negative for nausea, vomiting and diarrhea.  Endocrine: Positive for polydipsia and polyuria. Negative for cold intolerance, heat intolerance and polyphagia.  Musculoskeletal: Negative for myalgias and arthralgias.  Skin: Negative for color change, pallor, rash and wound.  Neurological: Negative for seizures and headaches.  Psychiatric/Behavioral: Negative for suicidal ideas and confusion.    Objective:    BP 130/68 mmHg  Pulse 80  Ht 5' 5.5" (1.664 m)  Wt 174 lb (78.926 kg)  BMI 28.50 kg/m2  Wt Readings from Last 3 Encounters:  11/15/15 174 lb (78.926 kg)  11/06/15 173 lb (78.472 kg)    Physical Exam  Constitutional: She is oriented to person, place, and time. She appears well-developed.  HENT:  Head: Normocephalic and atraumatic.  Eyes: EOM are normal.  Neck: Normal range of motion. Neck supple. No tracheal deviation present. No thyromegaly present.  Cardiovascular: Normal rate and regular rhythm.   Pulmonary/Chest: Effort normal and breath sounds normal.  Abdominal: Soft. Bowel sounds are normal. There is no tenderness. There is no guarding.  Musculoskeletal: Normal range of motion. She exhibits no edema.  Neurological: She is alert and oriented to person, place, and time. She has normal reflexes. No cranial nerve deficit.  Coordination normal.  Skin: Skin is warm and dry. No rash noted. No erythema. No pallor.  Psychiatric: She has a normal mood and affect. Judgment normal.     Assessment & Plan:   1. Type 2 diabetes mellitus with vascular disease ([place])  - Patient has currently uncontrolled symptomatic type 2 DM since  70 years of age,  with most recent A1c of 8.4 %. Recent labs reviewed.   Her diabetes is complicated by CVA and patient remains at a high risk for more acute and chronic complications of diabetes which include CAD, CVA, CKD, retinopathy, and neuropathy. These are all discussed in detail with the patient.  - I have counseled the patient on diet management and weight loss, by adopting a carbohydrate restricted/protein rich diet.  - Suggestion is made for patient to avoid simple carbohydrates   from their diet including Cakes , Desserts, Ice Cream,  Soda (  diet and regular) , Sweet Tea , Candies,  Chips, Cookies, Artificial Sweeteners,   and "Sugar-free" Products . This will help patient to have stable blood glucose profile and potentially avoid unintended weight gain.  - I encouraged the patient to switch to  unprocessed or minimally processed complex starch and increased protein intake (animal or plant source), fruits, and vegetables.  - Patient is advised to stick to a routine mealtimes to eat 3 meals  a day and  avoid unnecessary snacks ( to snack only to correct hypoglycemia).  - The patient will be scheduled with Jearld Fenton, RDN, CDE for individualized DM education.  - I have approached patient with the following individualized plan to manage diabetes and patient agrees:   - She will be allowed to continue to use her premixed insulin, Novolin 70/30, which reportedly was chosen due to cost issues. -  I  will continue Novolin 70/30 Increase morning dose to 40 units with breakfast and keep evening dose at 30 units with supper for pre-meal blood glucose above 90 mg/dL, associated with  monitoring of blood glucose before meals and at bedtime for one week and return with meter and logs for reevaluation.   - Patient is warned not to take insulin without proper monitoring per orders. -Adjustment parameters are given for hypo and hyperglycemia in writing. -Patient is encouraged to call clinic for blood glucose levels less than 70 or above 300 mg /dl. - I will  lower metformin to 500 mg by mouth twice a day due to mild CK D,  therapeutically suitable for patient..  - If her glycemic profile is significantly above target, she will be considered for insulin analogs for basal/bolus utilization.   - Patient specific target  A1c;  LDL, HDL, Triglycerides, and  Waist Circumference were discussed in detail.  2) BP/HTN: Controlled. Continue current medications including ACEI/ARB. 3) Lipids/HPL:  Controlled with LDL 47, continue statins. 4)  Weight/Diet: CDE Consult will be initiated , exercise, and detailed carbohydrates information provided.  5) Chronic Care/Health Maintenance:  -Patient is on ACEI/ARB and Statin medications and encouraged to continue to follow up with Ophthalmology, Podiatrist at least yearly or according to recommendations, and advised to   stay away from smoking. I have recommended yearly flu vaccine and pneumonia vaccination at least every 5 years; moderate intensity exercise for up to 150 minutes weekly; and  sleep for at least 7 hours a day.  - 60 minutes of time was spent on the care of this patient , 50% of which was applied for counseling on diabetes complications and their preventions.  - Patient to bring meter and  blood glucose logs during their next visit.   - I advised patient to maintain close follow up with Gar Ponto, MD for primary care needs.  Follow up plan: - Return in about 6 weeks (around 12/27/2015) for follow up with pre-visit labs, meter, and logs.  Glade Lloyd, MD Phone: [phone]  Fax: [phone]   11/15/2015, 3:00 PM

## 2015-11-19 DIAGNOSIS — H25011 Cortical age-related cataract, right eye: Secondary | ICD-10-CM | POA: Diagnosis not present

## 2015-11-19 DIAGNOSIS — H2511 Age-related nuclear cataract, right eye: Secondary | ICD-10-CM | POA: Diagnosis not present

## 2015-11-20 ENCOUNTER — Encounter: Payer: Self-pay | Admitting: "Endocrinology

## 2015-12-18 ENCOUNTER — Ambulatory Visit (INDEPENDENT_AMBULATORY_CARE_PROVIDER_SITE_OTHER): Payer: Medicare Other | Admitting: Ophthalmology

## 2015-12-18 DIAGNOSIS — E113393 Type 2 diabetes mellitus with moderate nonproliferative diabetic retinopathy without macular edema, bilateral: Secondary | ICD-10-CM | POA: Diagnosis not present

## 2015-12-18 DIAGNOSIS — I10 Essential (primary) hypertension: Secondary | ICD-10-CM

## 2015-12-18 DIAGNOSIS — H43813 Vitreous degeneration, bilateral: Secondary | ICD-10-CM | POA: Diagnosis not present

## 2015-12-18 DIAGNOSIS — E11319 Type 2 diabetes mellitus with unspecified diabetic retinopathy without macular edema: Secondary | ICD-10-CM

## 2015-12-18 DIAGNOSIS — H35033 Hypertensive retinopathy, bilateral: Secondary | ICD-10-CM | POA: Diagnosis not present

## 2015-12-23 ENCOUNTER — Encounter: Payer: Self-pay | Admitting: Cardiology

## 2015-12-23 DIAGNOSIS — E871 Hypo-osmolality and hyponatremia: Secondary | ICD-10-CM | POA: Diagnosis not present

## 2015-12-23 DIAGNOSIS — E782 Mixed hyperlipidemia: Secondary | ICD-10-CM | POA: Diagnosis not present

## 2015-12-23 DIAGNOSIS — I10 Essential (primary) hypertension: Secondary | ICD-10-CM | POA: Diagnosis not present

## 2015-12-23 DIAGNOSIS — E1122 Type 2 diabetes mellitus with diabetic chronic kidney disease: Secondary | ICD-10-CM | POA: Diagnosis not present

## 2015-12-23 DIAGNOSIS — K210 Gastro-esophageal reflux disease with esophagitis: Secondary | ICD-10-CM | POA: Diagnosis not present

## 2015-12-23 DIAGNOSIS — E876 Hypokalemia: Secondary | ICD-10-CM | POA: Diagnosis not present

## 2015-12-30 ENCOUNTER — Encounter: Payer: Self-pay | Admitting: Cardiology

## 2015-12-30 DIAGNOSIS — N183 Chronic kidney disease, stage 3 (moderate): Secondary | ICD-10-CM | POA: Diagnosis not present

## 2015-12-30 DIAGNOSIS — R0602 Shortness of breath: Secondary | ICD-10-CM | POA: Diagnosis not present

## 2015-12-30 DIAGNOSIS — E1122 Type 2 diabetes mellitus with diabetic chronic kidney disease: Secondary | ICD-10-CM | POA: Diagnosis not present

## 2015-12-30 DIAGNOSIS — Z6829 Body mass index (BMI) 29.0-29.9, adult: Secondary | ICD-10-CM | POA: Diagnosis not present

## 2015-12-30 DIAGNOSIS — K219 Gastro-esophageal reflux disease without esophagitis: Secondary | ICD-10-CM | POA: Diagnosis not present

## 2015-12-30 DIAGNOSIS — Z0001 Encounter for general adult medical examination with abnormal findings: Secondary | ICD-10-CM | POA: Diagnosis not present

## 2015-12-30 DIAGNOSIS — I10 Essential (primary) hypertension: Secondary | ICD-10-CM | POA: Diagnosis not present

## 2015-12-30 DIAGNOSIS — Z1212 Encounter for screening for malignant neoplasm of rectum: Secondary | ICD-10-CM | POA: Diagnosis not present

## 2015-12-30 DIAGNOSIS — Z23 Encounter for immunization: Secondary | ICD-10-CM | POA: Diagnosis not present

## 2015-12-30 DIAGNOSIS — F331 Major depressive disorder, recurrent, moderate: Secondary | ICD-10-CM | POA: Diagnosis not present

## 2015-12-30 DIAGNOSIS — R072 Precordial pain: Secondary | ICD-10-CM | POA: Diagnosis not present

## 2015-12-30 DIAGNOSIS — E782 Mixed hyperlipidemia: Secondary | ICD-10-CM | POA: Diagnosis not present

## 2015-12-30 LAB — LIPID PANEL
CHOLESTEROL: 119 mg/dL (ref 0–200)
HDL: 50 mg/dL (ref 35–70)
LDL Cholesterol: 47 mg/dL
Triglycerides: 108 mg/dL (ref 40–160)

## 2015-12-30 LAB — HEMOGLOBIN A1C: HEMOGLOBIN A1C: 6.4

## 2016-01-02 ENCOUNTER — Ambulatory Visit: Payer: Self-pay | Admitting: Nutrition

## 2016-01-06 ENCOUNTER — Encounter: Payer: Self-pay | Admitting: "Endocrinology

## 2016-01-06 ENCOUNTER — Ambulatory Visit (INDEPENDENT_AMBULATORY_CARE_PROVIDER_SITE_OTHER): Payer: Medicare Other | Admitting: "Endocrinology

## 2016-01-06 VITALS — BP 131/73 | HR 68 | Ht 65.5 in | Wt 174.0 lb

## 2016-01-06 DIAGNOSIS — Z794 Long term (current) use of insulin: Secondary | ICD-10-CM | POA: Diagnosis not present

## 2016-01-06 DIAGNOSIS — E785 Hyperlipidemia, unspecified: Secondary | ICD-10-CM

## 2016-01-06 DIAGNOSIS — E1159 Type 2 diabetes mellitus with other circulatory complications: Secondary | ICD-10-CM

## 2016-01-06 DIAGNOSIS — I10 Essential (primary) hypertension: Secondary | ICD-10-CM | POA: Diagnosis not present

## 2016-01-06 DIAGNOSIS — N183 Chronic kidney disease, stage 3 (moderate): Secondary | ICD-10-CM

## 2016-01-06 DIAGNOSIS — E1122 Type 2 diabetes mellitus with diabetic chronic kidney disease: Secondary | ICD-10-CM

## 2016-01-06 NOTE — Progress Notes (Signed)
Subjective:    Patient ID: Angela Harris, female    DOB: 1945/09/11. Patient is being seen in f/u for management of diabetes requested by  Gar Ponto, MD  Past Medical History:  Diagnosis Date  . Diabetes mellitus, type II ([place])   . Hypertension   . Stroke [facility])    Past Surgical History:  Procedure Laterality Date  . ABDOMINAL HYSTERECTOMY    . BACK SURGERY    . CHOLECYSTECTOMY    . TONSILLECTOMY     Social History   Social History  . Marital status: Divorced    Spouse name: N/A  . Number of children: N/A  . Years of education: N/A   Social History Main Topics  . Smoking status: Never Smoker  . Smokeless tobacco: Never Used  . Alcohol use No  . Drug use: No  . Sexual activity: Not Asked   Other Topics Concern  . None   Social History Narrative  . None   Outpatient Encounter Prescriptions as of 01/06/2016  Medication Sig  . amLODipine (NORVASC) 10 MG tablet Take 10 mg by mouth daily.  Marland Kitchen aspirin 81 MG tablet Take 81 mg by mouth daily.  Marland Kitchen atenolol (TENORMIN) 50 MG tablet Take 50 mg by mouth daily.  . Calcium Citrate-Vitamin D (CALCIUM + D PO) Take by mouth.  . gabapentin (NEURONTIN) 300 MG capsule Take 300 mg by mouth 2 (two) times daily.  Marland Kitchen glucose blood (ONE TOUCH ULTRA TEST) test strip Use as instructed  . insulin NPH-regular Human (NOVOLIN 70/30) (70-30) 100 UNIT/ML injection Inject 30-40 Units into the skin 2 (two) times daily with a meal. Inject 40 units with breakfast and 30 units with supper  . lisinopril-hydrochlorothiazide (PRINZIDE,ZESTORETIC) 20-12.5 MG tablet Take 2 tablets by mouth daily.   . metFORMIN (GLUCOPHAGE) 500 MG tablet Take 500 mg by mouth 2 (two) times daily with a meal.  . pantoprazole (PROTONIX) 40 MG tablet Take 40 mg by mouth 2 (two) times daily.   . simvastatin (ZOCOR) 40 MG tablet Take 40 mg by mouth daily.  Marland Kitchen venlafaxine XR (EFFEXOR-XR) 75 MG 24 hr capsule Take 75 mg by mouth daily with breakfast.   No facility-administered  encounter medications on file as of 01/06/2016.    ALLERGIES: No Known Allergies VACCINATION STATUS:  There is no immunization history on file for this patient.  Diabetes  She presents for her follow-up diabetic visit. She has type 2 diabetes mellitus. Onset time: She was diagnosed at approximate age of 89 years. Her disease course has been improving. There are no hypoglycemic associated symptoms. Pertinent negatives for hypoglycemia include no confusion, headaches, pallor or seizures. Associated symptoms include fatigue. Pertinent negatives for diabetes include no chest pain, no polydipsia, no polyphagia and no polyuria. There are no hypoglycemic complications. Symptoms are improving. Diabetic complications include a CVA. Risk factors for coronary artery disease include diabetes mellitus, dyslipidemia, hypertension and sedentary lifestyle. Current diabetic treatment includes insulin injections and oral agent (monotherapy). She is compliant with treatment some of the time. Her weight is stable. She is following a generally unhealthy diet. When asked about meal planning, she reported none. She has not had a previous visit with a dietitian. Blood glucose monitoring compliance is inadequate. Her breakfast blood glucose range is generally 140-180 mg/dl. Her lunch blood glucose range is generally 140-180 mg/dl. Her dinner blood glucose range is generally 140-180 mg/dl. Her overall blood glucose range is 140-180 mg/dl. An ACE inhibitor/angiotensin II receptor blocker is being taken.  Hyperlipidemia  This is a chronic problem. The current episode started more than 1 year ago. Exacerbating diseases include diabetes. Pertinent negatives include no chest pain, myalgias or shortness of breath. Current antihyperlipidemic treatment includes statins. Risk factors for coronary artery disease include diabetes mellitus, dyslipidemia and hypertension.  Hypertension  This is a chronic problem. The current episode started  more than 1 year ago. Pertinent negatives include no chest pain, headaches, palpitations or shortness of breath. Risk factors for coronary artery disease include diabetes mellitus, dyslipidemia and sedentary lifestyle. Past treatments include ACE inhibitors. Hypertensive end-organ damage includes CVA.      Review of Systems  Constitutional: Positive for fatigue. Negative for chills, fever and unexpected weight change.  HENT: Negative for trouble swallowing and voice change.   Eyes: Negative for visual disturbance.  Respiratory: Negative for cough, shortness of breath and wheezing.   Cardiovascular: Negative for chest pain, palpitations and leg swelling.  Gastrointestinal: Negative for diarrhea, nausea and vomiting.  Endocrine: Negative for cold intolerance, heat intolerance, polydipsia, polyphagia and polyuria.  Musculoskeletal: Negative for arthralgias and myalgias.  Skin: Negative for color change, pallor, rash and wound.  Neurological: Negative for seizures and headaches.  Psychiatric/Behavioral: Negative for confusion and suicidal ideas.    Objective:    BP 131/73   Pulse 68   Ht 5' 5.5" (1.664 m)   Wt 174 lb (78.9 kg)   BMI 28.51 kg/m   Wt Readings from Last 3 Encounters:  01/06/16 174 lb (78.9 kg)  11/15/15 174 lb (78.9 kg)  11/06/15 173 lb (78.5 kg)    Physical Exam  Constitutional: She is oriented to person, place, and time. She appears well-developed.  HENT:  Head: Normocephalic and atraumatic.  Eyes: EOM are normal.  Neck: Normal range of motion. Neck supple. No tracheal deviation present. No thyromegaly present.  Cardiovascular: Normal rate and regular rhythm.   Pulmonary/Chest: Effort normal and breath sounds normal.  Abdominal: Soft. Bowel sounds are normal. There is no tenderness. There is no guarding.  Musculoskeletal: Normal range of motion. She exhibits no edema.  Neurological: She is alert and oriented to person, place, and time. She has normal reflexes. No  cranial nerve deficit. Coordination normal.  Skin: Skin is warm and dry. No rash noted. No erythema. No pallor.  Psychiatric: She has a normal mood and affect. Judgment normal.     Assessment & Plan:   1. Type 2 diabetes mellitus with vascular disease ([place])  - Patient has currently uncontrolled symptomatic type 2 DM since  70 years of age,  with most recent A1c of  6.4% improving from 8.4 %. Recent labs reviewed.   Her diabetes is complicated by CVA and patient remains at a high risk for more acute and chronic complications of diabetes which include CAD, CVA, CKD, retinopathy, and neuropathy. These are all discussed in detail with the patient.  - I have counseled the patient on diet management and weight loss, by adopting a carbohydrate restricted/protein rich diet.  - Suggestion is made for patient to avoid simple carbohydrates   from their diet including Cakes , Desserts, Ice Cream,  Soda (  diet and regular) , Sweet Tea , Candies,  Chips, Cookies, Artificial Sweeteners,   and "Sugar-free" Products . This will help patient to have stable blood glucose profile and potentially avoid unintended weight gain.  - I encouraged the patient to switch to  unprocessed or minimally processed complex starch and increased protein intake (animal or plant source), fruits, and vegetables.  -  Patient is advised to stick to a routine mealtimes to eat 3 meals  a day and avoid unnecessary snacks ( to snack only to correct hypoglycemia).  - The patient will be scheduled with Jearld Fenton, RDN, CDE for individualized DM education.  - I have approached patient with the following individualized plan to manage diabetes and patient agrees:   - She will be allowed to continue to use her premixed insulin, Novolin 70/30, which reportedly was chosen due to cost issues. -  I  will continue Novolin 70/30  40 units with breakfast and keep evening dose at 30 units with supper for pre-meal blood glucose above 90 mg/dL,  associated with monitoring of blood glucose before meals and at bedtime for one week and return with meter and logs for reevaluation.   - Patient is warned not to take insulin without proper monitoring per orders. -Adjustment parameters are given for hypo and hyperglycemia in writing. -Patient is encouraged to call clinic for blood glucose levels less than 70 or above 300 mg /dl. - I will  lower metformin to 500 mg by mouth twice a day due to mild CKD,  therapeutically suitable for patient.  - If her glycemic profile is significantly above target, she will be considered for insulin analogs for basal/bolus utilization.   - Patient specific target  A1c;  LDL, HDL, Triglycerides, and  Waist Circumference were discussed in detail.  2) BP/HTN: Controlled. Continue current medications including ACEI/ARB. 3) Lipids/HPL:  Controlled with LDL 47, continue statins. 4)  Weight/Diet: CDE Consult will be initiated , exercise, and detailed carbohydrates information provided.  5) Chronic Care/Health Maintenance:  -Patient is on ACEI/ARB and Statin medications and encouraged to continue to follow up with Ophthalmology, Podiatrist at least yearly or according to recommendations, and advised to   stay away from smoking. I have recommended yearly flu vaccine and pneumonia vaccination at least every 5 years; moderate intensity exercise for up to 150 minutes weekly; and  sleep for at least 7 hours a day. - She says that she was diagnosed with L BBB, scheduled to have stress test.  - 60 minutes of time was spent on the care of this patient , 50% of which was applied for counseling on diabetes complications and their preventions.  - Patient to bring meter and  blood glucose logs during their next visit.   - I advised patient to maintain close follow up with Gar Ponto, MD for primary care needs.  Follow up plan: - Return in about 3 months (around 04/07/2016) for follow up with pre-visit labs, meter, and  logs.  Glade Lloyd, MD Phone: [phone]  Fax: [phone]   01/06/2016, 9:51 AM

## 2016-01-06 NOTE — Patient Instructions (Signed)

## 2016-01-09 ENCOUNTER — Encounter: Payer: Self-pay | Admitting: Cardiology

## 2016-01-09 DIAGNOSIS — R5383 Other fatigue: Secondary | ICD-10-CM | POA: Diagnosis not present

## 2016-01-09 DIAGNOSIS — R0602 Shortness of breath: Secondary | ICD-10-CM | POA: Diagnosis not present

## 2016-01-09 DIAGNOSIS — I447 Left bundle-branch block, unspecified: Secondary | ICD-10-CM | POA: Diagnosis not present

## 2016-01-09 DIAGNOSIS — R079 Chest pain, unspecified: Secondary | ICD-10-CM | POA: Diagnosis not present

## 2016-01-10 ENCOUNTER — Ambulatory Visit: Payer: Self-pay | Admitting: Nutrition

## 2016-01-24 ENCOUNTER — Telehealth: Payer: Self-pay

## 2016-01-24 NOTE — Progress Notes (Deleted)
Patient called to inquired on Heart Failure Research Study. She does not currently have a heart failure diagnosis but states she has "all" the symptoms. She is unable to perform her normal activities without sitting down to rest. She has ankle swelling. She states she had an EKG with a LBBB and also had a stress test with medication. She is in no acute distress but is reaching out for advice. I advised that she speak to her PCP regarding her symptoms and to request cardiology evaluation due to her symptoms and age. Very appreciative of conversation.

## 2016-01-24 NOTE — Telephone Encounter (Signed)
Patient called to inquired on Heart Failure Research Study. She does not currently have a heart failure diagnosis but states she has "all" the symptoms. She is unable to perform her normal activities without sitting down to rest. She has ankle swelling. She states she had an EKG with a LBBB and also had a stress test with medication. She is in no acute distress but is reaching out for advice. I advised that she speak to her PCP regarding her symptoms and to request cardiology evaluation due to her symptoms and age. Very appreciative of conversation.

## 2016-01-31 DIAGNOSIS — Z4789 Encounter for other orthopedic aftercare: Secondary | ICD-10-CM | POA: Diagnosis not present

## 2016-01-31 DIAGNOSIS — S52321A Displaced transverse fracture of shaft of right radius, initial encounter for closed fracture: Secondary | ICD-10-CM | POA: Diagnosis not present

## 2016-01-31 DIAGNOSIS — Z8673 Personal history of transient ischemic attack (TIA), and cerebral infarction without residual deficits: Secondary | ICD-10-CM | POA: Diagnosis not present

## 2016-01-31 DIAGNOSIS — I10 Essential (primary) hypertension: Secondary | ICD-10-CM | POA: Diagnosis not present

## 2016-01-31 DIAGNOSIS — E119 Type 2 diabetes mellitus without complications: Secondary | ICD-10-CM | POA: Diagnosis not present

## 2016-01-31 DIAGNOSIS — S52501A Unspecified fracture of the lower end of right radius, initial encounter for closed fracture: Secondary | ICD-10-CM | POA: Diagnosis not present

## 2016-01-31 DIAGNOSIS — S52591A Other fractures of lower end of right radius, initial encounter for closed fracture: Secondary | ICD-10-CM | POA: Diagnosis not present

## 2016-01-31 DIAGNOSIS — S52351A Displaced comminuted fracture of shaft of radius, right arm, initial encounter for closed fracture: Secondary | ICD-10-CM | POA: Diagnosis not present

## 2016-01-31 DIAGNOSIS — W010XXA Fall on same level from slipping, tripping and stumbling without subsequent striking against object, initial encounter: Secondary | ICD-10-CM | POA: Diagnosis not present

## 2016-02-01 DIAGNOSIS — S52501A Unspecified fracture of the lower end of right radius, initial encounter for closed fracture: Secondary | ICD-10-CM | POA: Diagnosis not present

## 2016-02-01 DIAGNOSIS — Z4789 Encounter for other orthopedic aftercare: Secondary | ICD-10-CM | POA: Diagnosis not present

## 2016-02-01 DIAGNOSIS — S52321A Displaced transverse fracture of shaft of right radius, initial encounter for closed fracture: Secondary | ICD-10-CM | POA: Diagnosis not present

## 2016-02-04 ENCOUNTER — Ambulatory Visit (INDEPENDENT_AMBULATORY_CARE_PROVIDER_SITE_OTHER): Payer: Medicare Other | Admitting: Orthopaedic Surgery

## 2016-02-04 ENCOUNTER — Ambulatory Visit (INDEPENDENT_AMBULATORY_CARE_PROVIDER_SITE_OTHER): Payer: Medicare Other

## 2016-02-04 ENCOUNTER — Encounter: Payer: Self-pay | Admitting: Orthopaedic Surgery

## 2016-02-04 VITALS — BP 124/69 | HR 74 | Temp 97.5°F | Ht 64.0 in | Wt 180.4 lb

## 2016-02-04 DIAGNOSIS — S52501A Unspecified fracture of the lower end of right radius, initial encounter for closed fracture: Secondary | ICD-10-CM

## 2016-02-04 NOTE — Progress Notes (Signed)
Subjective: I broke my right wrist    Patient ID: Angela Harris, female    DOB: Feb 15, 1946, 70 y.o.   MRN: [id number]  HPI She fell during the [place] in [place] this past weekend. She hurt her right wrist.  She was seen at [facility]. She has fracture of the right distal radius.  It was displaced.  A closed reduction was done. She was placed in sugar tong cast.  She has no other injury.  She has pain controlled.  She brings disc of x-rays and reports.  I have reviewed them.   Review of Systems  HENT: Positive for congestion.   Respiratory: Negative for cough and shortness of breath.   Cardiovascular: Negative for chest pain and leg swelling.  Endocrine: Negative for cold intolerance.  Musculoskeletal: Positive for arthralgias and joint swelling.  Allergic/Immunologic: Negative for environmental allergies.   Past Medical History:  Diagnosis Date  . Diabetes mellitus, type II ([place])   . Hypertension   . Stroke [facility])     Past Surgical History:  Procedure Laterality Date  . ABDOMINAL HYSTERECTOMY    . BACK SURGERY    . CHOLECYSTECTOMY    . TONSILLECTOMY      Current Outpatient Prescriptions on File Prior to Visit  Medication Sig Dispense Refill  . amLODipine (NORVASC) 10 MG tablet Take 10 mg by mouth daily.    Marland Kitchen aspirin 81 MG tablet Take 81 mg by mouth daily.    Marland Kitchen atenolol (TENORMIN) 50 MG tablet Take 50 mg by mouth daily.    . Calcium Citrate-Vitamin D (CALCIUM + D PO) Take by mouth.    . gabapentin (NEURONTIN) 300 MG capsule Take 300 mg by mouth 2 (two) times daily.    Marland Kitchen glucose blood (ONE TOUCH ULTRA TEST) test strip Use as instructed 150 each 3  . insulin NPH-regular Human (NOVOLIN 70/30) (70-30) 100 UNIT/ML injection Inject 30-40 Units into the skin 2 (two) times daily with a meal. Inject 40 units with breakfast and 30 units with supper    . lisinopril-hydrochlorothiazide (PRINZIDE,ZESTORETIC) 20-12.5 MG tablet Take 2 tablets by mouth daily.     . metFORMIN  (GLUCOPHAGE) 500 MG tablet Take 500 mg by mouth 2 (two) times daily with a meal.    . pantoprazole (PROTONIX) 40 MG tablet Take 40 mg by mouth 2 (two) times daily.     . simvastatin (ZOCOR) 40 MG tablet Take 40 mg by mouth daily.    Marland Kitchen venlafaxine XR (EFFEXOR-XR) 75 MG 24 hr capsule Take 75 mg by mouth daily with breakfast.     No current facility-administered medications on file prior to visit.     Social History   Social History  . Marital status: Divorced    Spouse name: N/A  . Number of children: N/A  . Years of education: N/A   Occupational History  . Not on file.   Social History Main Topics  . Smoking status: Never Smoker  . Smokeless tobacco: Never Used  . Alcohol use No  . Drug use: No  . Sexual activity: Not on file   Other Topics Concern  . Not on file   Social History Narrative  . No narrative on file    Family History  Problem Relation Age of Onset  . Alzheimer's disease Mother   . Thyroid disease Mother   . Hypertension Mother   . Alcoholism Father   . Hypertension Father   . Alcoholism Sister   . Hypertension Sister   .  Alcoholism Brother   . Hypertension Brother     BP 124/69   Pulse 74   Temp 97.5 F (36.4 C)   Ht 5\' 4"  (1.626 m)   Wt 180 lb 6.4 oz (81.8 kg)   BMI 30.97 kg/m      Objective:   Physical Exam  Constitutional: She is oriented to person, place, and time. She appears well-developed and well-nourished.  HENT:  Head: Normocephalic and atraumatic.  Eyes: Conjunctivae and EOM are normal. Pupils are equal, round, and reactive to light.  Neck: Normal range of motion. Neck supple.  Cardiovascular: Normal rate, regular rhythm and intact distal pulses.   Pulmonary/Chest: Effort normal.  Abdominal: Soft.  Musculoskeletal: She exhibits tenderness (Pain of the right wrist in a sugar tong splint.  NV intact.  No other injury.).  Neurological: She is alert and oriented to person, place, and time. She displays normal reflexes. No cranial  nerve deficit. She exhibits normal muscle tone. Coordination normal.  Skin: Skin is warm and dry.  Psychiatric: She has a normal mood and affect. Her behavior is normal. Judgment and thought content normal.          Assessment & Plan:   Encounter Diagnosis  Name Primary?  . Fracture of right distal radius, closed, initial encounter Yes   To see Dr. Aline Brochure for possible surgery discussion.  Electronically Signed Sanjuana Kava, MD 9/26/201710:21 AM

## 2016-02-04 NOTE — Progress Notes (Signed)
right distal radius fracture  Treated with closed reduction under anesthesia elsewhere  Radiographs today shows slight change in position of the fracture. Discussed with the patient operative versus nonoperative treatment options. We decided to x-ray again in 1 week and if any other changes occur in the fracture that would be forced to use internal fixation  X-ray 1 week

## 2016-02-11 ENCOUNTER — Ambulatory Visit (INDEPENDENT_AMBULATORY_CARE_PROVIDER_SITE_OTHER): Payer: Medicare Other | Admitting: Orthopedic Surgery

## 2016-02-11 ENCOUNTER — Encounter: Payer: Self-pay | Admitting: Orthopedic Surgery

## 2016-02-11 ENCOUNTER — Ambulatory Visit (INDEPENDENT_AMBULATORY_CARE_PROVIDER_SITE_OTHER): Payer: Medicare Other

## 2016-02-11 VITALS — BP 139/61 | HR 69 | Ht 64.0 in | Wt 175.0 lb

## 2016-02-11 DIAGNOSIS — S62101D Fracture of unspecified carpal bone, right wrist, subsequent encounter for fracture with routine healing: Secondary | ICD-10-CM

## 2016-02-11 NOTE — Progress Notes (Signed)
Patient ID: Angela Harris, female   DOB: [birth year], 70 y.o.   MRN: [id number]    Chief Complaint  Patient presents with  . Follow-up    Recheck right wrist with xray in splint, DOI 01-31-16.    BP 139/61   Pulse 69   Ht 5\' 4"  (1.626 m)   Wt 175 lb (79.4 kg)   BMI 30.04 kg/m   11 days Post closed reduction in [place]. Postreduction x-ray compared to last time shows no change in the position of the fracture so we are going up on the new cast today and x-ray again in one week

## 2016-02-17 ENCOUNTER — Ambulatory Visit: Payer: Medicare Other | Admitting: Orthopedic Surgery

## 2016-02-18 ENCOUNTER — Ambulatory Visit (INDEPENDENT_AMBULATORY_CARE_PROVIDER_SITE_OTHER): Payer: Medicare Other | Admitting: Orthopedic Surgery

## 2016-02-18 ENCOUNTER — Ambulatory Visit (INDEPENDENT_AMBULATORY_CARE_PROVIDER_SITE_OTHER): Payer: Medicare Other

## 2016-02-18 ENCOUNTER — Encounter: Payer: Self-pay | Admitting: Cardiology

## 2016-02-18 ENCOUNTER — Telehealth: Payer: Self-pay | Admitting: *Deleted

## 2016-02-18 ENCOUNTER — Other Ambulatory Visit: Payer: Self-pay | Admitting: *Deleted

## 2016-02-18 ENCOUNTER — Encounter: Payer: Self-pay | Admitting: Orthopedic Surgery

## 2016-02-18 ENCOUNTER — Encounter: Payer: Self-pay | Admitting: *Deleted

## 2016-02-18 VITALS — BP 111/47 | HR 78 | Wt 173.0 lb

## 2016-02-18 DIAGNOSIS — S62101D Fracture of unspecified carpal bone, right wrist, subsequent encounter for fracture with routine healing: Secondary | ICD-10-CM

## 2016-02-18 MED ORDER — HYDROCODONE-ACETAMINOPHEN 5-325 MG PO TABS: 1.0000 | ORAL_TABLET | Freq: Four times a day (QID) | ORAL | 0 refills | Status: DC | PRN

## 2016-02-18 NOTE — Telephone Encounter (Signed)
That's why I want someone in the room !  approved

## 2016-02-18 NOTE — Progress Notes (Signed)
Cardiology Office Note  Date: 02/19/2016   ID: Angela Harris, DOB [birth year], MRN [id number]  PCP: Angela Ponto, MD  Consulting Cardiologist: Angela Lesches, MD   Chief Complaint  Patient presents with  . Left bundle branch block    History of Present Illness: Angela Harris is a 70 y.o. female referred for cardiology consultation by Angela Harris. She has had left bundle branch block documented intermittently by ECG, I reviewed her most recent tracing as outlined below. She has no known history of ischemic heart disease, reports no angina symptoms. She enjoys line dancing and does this with a group at the [facility] that actually competes. She does have fatigue and sometimes has to rest during 30 minute sessions.  She underwent a recent Myoview at [facility] in August, report indicating no definite scar or ischemia with LVEF 62%. Mild hypokinesis of distal septum, suspect this may have been related to left bundle branch block at baseline.  I reviewed her medications which are outlined below. She reports compliance and follows up regularly with Angela Harris.  She does have a right wrist fracture, currently in a cast and plans to have surgery with Angela Harris next week. She fell while practicing line dancing.  Past Medical History:  Diagnosis Date  . CKD (chronic kidney disease) stage 3, GFR 30-59 ml/min   . Depression   . Essential hypertension   . GERD (gastroesophageal reflux disease)   . History of TIA (transient ischemic attack)   . Hyperlipidemia   . Peripheral neuropathy ([place])   . Type 2 diabetes mellitus ([place])     Past Surgical History:  Procedure Laterality Date  . ABDOMINAL HYSTERECTOMY    . BACK SURGERY    . CHOLECYSTECTOMY    . Closed reduction right wrist fracture    . TONSILLECTOMY      Current Outpatient Prescriptions  Medication Sig Dispense Refill  . amLODipine (NORVASC) 10 MG tablet Take 10 mg by mouth daily.    Marland Kitchen aspirin 81 MG tablet Take 81 mg by  mouth daily.    Marland Kitchen atenolol (TENORMIN) 50 MG tablet Take 50 mg by mouth daily.    . Calcium Citrate-Vitamin D (CALCIUM + D PO) Take 1 tablet by mouth 2 (two) times daily.     Marland Kitchen gabapentin (NEURONTIN) 300 MG capsule Take 300 mg by mouth 2 (two) times daily.    Marland Kitchen glucose blood (ONE TOUCH ULTRA TEST) test strip Use as instructed 150 each 3  . HYDROcodone-acetaminophen (NORCO/VICODIN) 5-325 MG tablet Take 1 tablet by mouth every 6 (six) hours as needed for moderate pain. (Patient taking differently: Take 1 tablet by mouth every 4 (four) hours as needed for moderate pain. ) 60 tablet 0  . insulin NPH-regular Human (NOVOLIN 70/30) (70-30) 100 UNIT/ML injection Inject 30-40 Units into the skin 2 (two) times daily with a meal. Inject 40 units with breakfast and 30 units with supper    . lisinopril-hydrochlorothiazide (PRINZIDE,ZESTORETIC) 20-12.5 MG tablet Take 2 tablets by mouth daily.     . metFORMIN (GLUCOPHAGE) 500 MG tablet Take 500 mg by mouth 2 (two) times daily with a meal.    . Multiple Vitamin (MULTIVITAMIN) tablet Take 1 tablet by mouth daily.    . pantoprazole (PROTONIX) 40 MG tablet Take 80 mg by mouth daily.     . simvastatin (ZOCOR) 40 MG tablet Take 40 mg by mouth daily.    Marland Kitchen venlafaxine XR (EFFEXOR-XR) 75 MG 24 hr capsule Take 75 mg by  mouth daily with breakfast.     No current facility-administered medications for this visit.    Allergies:  Review of patient's allergies indicates no known allergies.   Social History: The patient  reports that she has never smoked. She has never used smokeless tobacco. She reports that she does not drink alcohol or use drugs.   Family History: The patient's family history includes Alcoholism in her brother, father, and sister; Alzheimer's disease in her mother; Hypertension in her brother, father, mother, and sister; Thyroid disease in her mother.   ROS:  Please see the history of present illness. Otherwise, complete review of systems is positive for  general fatigue at times.  All other systems are reviewed and negative.   Physical Exam: VS:  BP 111/69   Pulse 73   Ht 5' 4.5" (1.638 m)   Wt 172 lb (78 kg)   BMI 29.07 kg/m , BMI Body mass index is 29.07 kg/m.  Wt Readings from Last 3 Encounters:  02/19/16 172 lb (78 kg)  02/18/16 173 lb (78.5 kg)  02/11/16 175 lb (79.4 kg)    General: Patient appears comfortable at rest. HEENT: Conjunctiva and lids normal, oropharynx clear. Neck: Supple, no elevated JVP or carotid bruits, no thyromegaly. Lungs: Clear to auscultation, nonlabored breathing at rest. Cardiac: Regular rate and rhythm, no S3 or significant systolic murmur, no pericardial rub. Abdomen: Soft, nontender, bowel sounds present, no guarding or rebound. Extremities: No pitting edema, distal pulses 2+. Cast on right arm. Skin: Warm and dry. Musculoskeletal: No kyphosis. Neuropsychiatric: Alert and oriented x3, affect grossly appropriate.  ECG: I personally reviewed the tracing from 12/30/2015 which showed sinus rhythm with left bundle-branch block.  Recent Labwork:    Component Value Date/Time   CHOL 119 12/30/2015   TRIG 108 12/30/2015   HDL 50 12/30/2015   LDLCALC 47 12/30/2015  August 2017: BUN 24, creatinine 1.2, potassium 4.2, AST 14, ALT 9, hemoglobin A1c 6.4, TSH 2.15  Other Studies Reviewed Today:  Lexiscan Myoview 01/09/2016 [facility]): No significant myocardial perfusion defects to indicate scar or ischemia, mild distal septal hypokinesis, LVEF 62%, overall low risk.  Assessment and Plan:  1. Left bundle-branch block by ECG, intermittently noted. She does not endorse any chest pain and there is no history of ischemic heart disease, she underwent a Lexiscan Myoview in August that showed no ischemia with LVEF 62%. She does have some dyspnea on exertion. We will obtain an echocardiogram to further clarify cardiac structure and function, mainly to exclude any component of cardiomyopathy. If reassuring, no  further cardiac workup anticipated at this time unless she develops escalating symptoms.  2. Patient states she is pending right wrist surgery with Angela Harris following fracture. In light of recent reassuring ischemic workup, she should be able to proceed at an acceptable perioperative cardiac risk.  3. Essential hypertension, currently on Norvasc, atenolol, and Prinzide. Blood pressure is normal today.  4. Type 2 diabetes mellitus, follows with Angela Harris. Recent hemoglobin A1c 6.4%.  Current medicines were reviewed with the patient today.   Orders Placed This Encounter  Procedures  . ECHOCARDIOGRAM COMPLETE    Disposition: Call with test results.  Signed, Satira Sark, MD, [facility] 02/19/2016 9:28 AM    [place] [facility] [place], [place], [place] [address] Phone: [phone]; Fax: [phone]

## 2016-02-18 NOTE — Telephone Encounter (Signed)
PATIENT REQUESTING REFILL ON HYDROCODONE 5/325

## 2016-02-19 ENCOUNTER — Encounter: Payer: Self-pay | Admitting: Cardiology

## 2016-02-19 ENCOUNTER — Ambulatory Visit (INDEPENDENT_AMBULATORY_CARE_PROVIDER_SITE_OTHER): Payer: Medicare Other | Admitting: Cardiology

## 2016-02-19 VITALS — BP 111/69 | HR 73 | Ht 64.5 in | Wt 172.0 lb

## 2016-02-19 DIAGNOSIS — R0609 Other forms of dyspnea: Secondary | ICD-10-CM | POA: Diagnosis not present

## 2016-02-19 DIAGNOSIS — I10 Essential (primary) hypertension: Secondary | ICD-10-CM | POA: Diagnosis not present

## 2016-02-19 DIAGNOSIS — I447 Left bundle-branch block, unspecified: Secondary | ICD-10-CM

## 2016-02-19 DIAGNOSIS — E119 Type 2 diabetes mellitus without complications: Secondary | ICD-10-CM

## 2016-02-19 NOTE — Patient Instructions (Signed)
Medication Instructions:  Continue all current medications.  Labwork: none  Testing/Procedures:  Your physician has requested that you have an echocardiogram. Echocardiography is a painless test that uses sound waves to create images of your heart. It provides your doctor with information about the size and shape of your heart and how well your heart's chambers and valves are working. This procedure takes approximately one hour. There are no restrictions for this procedure.  Office will contact with results via phone or letter.    Follow-Up: To be determined based on test results.    Any Other Special Instructions Will Be Listed Below (If Applicable).  If you need a refill on your cardiac medications before your next appointment, please call your pharmacy.

## 2016-02-19 NOTE — Progress Notes (Signed)
Patient ID: Angela Harris, female   DOB: 08/29/1945, 70 y.o.   MRN: [id number]  Fracture care  Chief Complaint  Patient presents with  . Follow-up    RIGHT WRIST FRACTURE, DOI 01/31/16    BP (!) 111/47   Pulse 78   Wt 173 lb (78.5 kg)   BMI 29.70 kg/m   The patient is now 19 days post distal radius fracture right wrist closed reduction was done in the ER in [place] today show loss of reduction  I discussed this with the patient and she has opted for surgical fixation.  Several x-rays were taken today. The x-rays show that there is been increased dorsal tilt to the distal radius  Patient will come in for ORIF right wrist with volar plating

## 2016-02-20 ENCOUNTER — Telehealth: Payer: Self-pay | Admitting: Radiology

## 2016-02-20 NOTE — Telephone Encounter (Signed)
The patient came in today stating the cast felt to tight and was rubbing at all the edges.  She did have some swelling so Dr. Luna Glasgow had me remove the short arm cast and place her in a long arm posterior splint.

## 2016-02-20 NOTE — Patient Instructions (Signed)
Angela Harris  02/20/2016     @PREFPERIOPPHARMACY @   Your procedure is scheduled on  02/26/2016   Report to [facility] at  1100  A.M.  Call this number if you have problems the morning of surgery:  [phone]   Remember:  Do not eat food or drink liquids after midnight.  Take these medicines the morning of surgery with A SIP OF WATER  Norvasc, atenolol, neurontin, hydrocodone, lisinopril, protonix, effexor. Take 1/2 of your usual night time insulin dosage the night before your surgery. DO NOT take any medicines for diabetes the morning of surgery.   Do not wear jewelry, make-up or nail polish.  Do not wear lotions, powders, or perfumes, or deoderant.  Do not shave 48 hours prior to surgery.  Men may shave face and neck.  Do not bring valuables to the hospital.  [facility] is not responsible for any belongings or valuables.  Contacts, dentures or bridgework may not be worn into surgery.  Leave your suitcase in the car.  After surgery it may be brought to your room.  For patients admitted to the hospital, discharge time will be determined by your treatment team.  Patients discharged the day of surgery will not be allowed to drive home.   Name and phone number of your driver:   family Special instructions:  none  Please read over the following fact sheets that you were given. Anesthesia Post-op Instructions and Care and Recovery After Surgery       Radial Fracture A radial fracture is a break in the radius bone, which is the long bone of the forearm that is on the same side as your thumb. Your forearm is the part of your arm that is between your elbow and your wrist. It is made up of two bones: the radius and the ulna. Most radial fractures occur near the wrist (distal radialfracture) or near the elbow (radial head fracture). A distal radial fracture is the most common type of broken arm. This fracture usually occurs about an inch above the wrist.  Fractures of the middle part of the bone are less common. CAUSES  Falling with your arm outstretched is the most common cause of a radial fracture. Other causes include:  Car accidents.  Bike accidents.  A direct blow to the middle part of the radius. RISK FACTORS  You may be at greater risk for a distal radial fracture if you are 49 years of age or older.  You may be at greater risk for a radial head fracture if you are:  Female.  29-62 years old.  You may be at a greater risk for all types of radial fractures if you have a condition that causes your bones to be weak or thin (osteoporosis). SIGNS AND SYMPTOMS A radial fracture causes pain immediately after the injury. Other signs and symptoms include:  An abnormal bend or bump in your arm (deformity).  Swelling.  Bruising.  Numbness or tingling.  Tenderness.  Limited movement. DIAGNOSIS  Your health care provider may diagnose a radial fracture based on:  Your symptoms.  Your medical history, including any recent injury.  A physical exam. Your health care provider will look for any deformity and feel for tenderness over the break. Your health care provider will also check whether the bone is out of place.  An X-ray exam to confirm the diagnosis and learn more about the type of fracture. TREATMENT The goals  of treatment are to get the bone in proper position for healing and to keep it from moving so it will heal over time. Your treatment will depend on many factors, especially the type of fracture that you have.  If the fractured bone:  Is in the correct position (nondisplaced), you may only need to wear a cast or a splint.  Has a slightly displaced fracture, you may need to have the bones moved back into place manually (closed reduction) before the splint or cast is put on.  You may have a temporary splint before you have a plaster cast. The splint allows room for some swelling. After a few days, a cast can  replace the splint.  You may have to wear the cast for about 6 weeks or as directed by your health care provider.  The cast may be changed after about 3 weeks or as directed by your health care provider.  After your cast is taken off, you may need physical therapy to regain full movement in your wrist or elbow.  You may need emergency surgery if you have:  A fractured bone that is out of position (displaced).  A fracture with multiple fragments (comminuted fracture).  A fracture that breaks the skin (open fracture). This type of fracture may require surgical wires, plates, or screws to hold the bone in place.  You may have X-rays every couple of weeks to check on your healing. HOME CARE INSTRUCTIONS  Keep the injured arm above the level of your heart while you are sitting or lying down. This helps to reduce swelling and pain.  Apply ice to the injured area:  Put ice in a plastic bag.  Place a towel between your skin and the bag.  Leave the ice on for 20 minutes, 2-3 times per day.  Move your fingers often to avoid stiffness and to minimize swelling.  If you have a plaster or fiberglass cast:  Do not try to scratch the skin under the cast using sharp or pointed objects.  Check the skin around the cast every day. You may put lotion on any red or sore areas.  Keep your cast dry and clean.  If you have a plaster splint:  Wear the splint as directed.  Loosen the elastic around the splint if your fingers become numb and tingle, or if they turn cold and blue.  Do not put pressure on any part of your cast until it is fully hardened. Rest your cast only on a pillow for the first 24 hours.  Protect your cast or splint while bathing or showering, as directed by your health care provider. Do not put your cast or splint into water.  Take medicines only as directed by your health care provider.  Return to activities, such as sports, as directed by your health care provider. Ask  your health care provider what activities are safe for you.  Keep all follow-up visits as directed by your health care provider. This is important. SEEK MEDICAL CARE IF:  Your pain medicine is not helping.  Your cast gets damaged or it breaks.  Your cast becomes loose.  Your cast gets wet.  You have more severe pain or swelling than you did before the cast.  You have severe pain when stretching your fingers.  You continue to have pain or stiffness in your elbow or your wrist after your cast is taken off. SEEK IMMEDIATE MEDICAL CARE IF:  You cannot move your fingers.  You lose  feeling in your fingers or your hand.  Your hand or your fingers turn cold and pale or blue.  You notice a bad smell coming from your cast.  You have drainage from underneath your cast.  You have new stains from blood or drainage seeping through your cast.   This information is not intended to replace advice given to you by your health care provider. Make sure you discuss any questions you have with your health care provider.   Document Released: 10/08/2005 Document Revised: 05/18/2014 Document Reviewed: 10/20/2013 Elsevier Interactive Patient Education 2016 [employer] PATIENT INSTRUCTIONS POST-ANESTHESIA  IMMEDIATELY FOLLOWING SURGERY:  Do not drive or operate machinery for the first twenty four hours after surgery.  Do not make any important decisions for twenty four hours after surgery or while taking narcotic pain medications or sedatives.  If you develop intractable nausea and vomiting or a severe headache please notify your doctor immediately.  FOLLOW-UP:  Please make an appointment with your surgeon as instructed. You do not need to follow up with anesthesia unless specifically instructed to do so.  WOUND CARE INSTRUCTIONS (if applicable):  Keep a dry clean dressing on the anesthesia/puncture wound site if there is drainage.  Once the wound has quit draining you may leave it open to air.   Generally you should leave the bandage intact for twenty four hours unless there is drainage.  If the epidural site drains for more than 36-48 hours please call the anesthesia department.  QUESTIONS?:  Please feel free to call your physician or the hospital operator if you have any questions, and they will be happy to assist you.

## 2016-02-21 ENCOUNTER — Encounter (HOSPITAL_COMMUNITY): Payer: Self-pay

## 2016-02-21 ENCOUNTER — Encounter (HOSPITAL_COMMUNITY)
Admission: RE | Admit: 2016-02-21 | Discharge: 2016-02-21 | Disposition: A | Discharge status: Home / Self Care | Payer: Medicare Other | Source: Ambulatory Visit | Attending: Orthopedic Surgery | Admitting: Orthopedic Surgery

## 2016-02-21 DIAGNOSIS — W19XXXA Unspecified fall, initial encounter: Secondary | ICD-10-CM | POA: Insufficient documentation

## 2016-02-21 DIAGNOSIS — S52501A Unspecified fracture of the lower end of right radius, initial encounter for closed fracture: Secondary | ICD-10-CM | POA: Insufficient documentation

## 2016-02-21 DIAGNOSIS — Z01812 Encounter for preprocedural laboratory examination: Secondary | ICD-10-CM | POA: Insufficient documentation

## 2016-02-21 HISTORY — DX: Cardiac arrhythmia, unspecified: I49.9

## 2016-02-21 HISTORY — DX: Meniere's disease, unspecified ear: H81.09

## 2016-02-21 LAB — CBC WITH DIFFERENTIAL/PLATELET
Basophils Absolute: 0.1 10*3/uL (ref 0.0–0.1)
Basophils Relative: 1 %
Eosinophils Absolute: 0.5 10*3/uL (ref 0.0–0.7)
Eosinophils Relative: 5 %
HEMATOCRIT: 33.8 % — AB (ref 36.0–46.0)
HEMOGLOBIN: 9.8 g/dL — AB (ref 12.0–15.0)
LYMPHS ABS: 2.1 10*3/uL (ref 0.7–4.0)
LYMPHS PCT: 25 %
MCH: 21.4 pg — AB (ref 26.0–34.0)
MCHC: 29 g/dL — AB (ref 30.0–36.0)
MCV: 73.6 fL — AB (ref 78.0–100.0)
MONO ABS: 0.9 10*3/uL (ref 0.1–1.0)
MONOS PCT: 11 %
NEUTROS ABS: 4.8 10*3/uL (ref 1.7–7.7)
NEUTROS PCT: 58 %
Platelets: 271 10*3/uL (ref 150–400)
RBC: 4.59 MIL/uL (ref 3.87–5.11)
RDW: 17.6 % — AB (ref 11.5–15.5)
WBC: 8.4 10*3/uL (ref 4.0–10.5)

## 2016-02-21 LAB — BASIC METABOLIC PANEL
Anion gap: 7 (ref 5–15)
BUN: 22 mg/dL — ABNORMAL HIGH (ref 6–20)
CO2: 29 mmol/L (ref 22–32)
Calcium: 9.4 mg/dL (ref 8.9–10.3)
Chloride: 100 mmol/L — ABNORMAL LOW (ref 101–111)
Creatinine, Ser: 1.14 mg/dL — ABNORMAL HIGH (ref 0.44–1.00)
GFR, EST AFRICAN AMERICAN: 56 mL/min — AB (ref >60)
GFR, EST NON AFRICAN AMERICAN: 48 mL/min — AB (ref >60)
Glucose, Bld: 154 mg/dL — ABNORMAL HIGH (ref 65–99)
POTASSIUM: 4.1 mmol/L (ref 3.5–5.1)
Sodium: 136 mmol/L (ref 135–145)

## 2016-02-26 ENCOUNTER — Encounter (HOSPITAL_COMMUNITY)
Admission: RE | Disposition: A | Discharge status: Home / Self Care | Payer: Self-pay | Source: Ambulatory Visit | Attending: Orthopedic Surgery

## 2016-02-26 ENCOUNTER — Ambulatory Visit (HOSPITAL_COMMUNITY): Payer: Medicare Other

## 2016-02-26 ENCOUNTER — Ambulatory Visit (HOSPITAL_COMMUNITY): Payer: Medicare Other | Admitting: Anesthesiology

## 2016-02-26 ENCOUNTER — Ambulatory Visit (HOSPITAL_COMMUNITY)
Admission: RE | Admit: 2016-02-26 | Discharge: 2016-02-26 | Disposition: A | Discharge status: Home / Self Care | Payer: Medicare Other | Source: Ambulatory Visit | Attending: Orthopedic Surgery | Admitting: Orthopedic Surgery

## 2016-02-26 ENCOUNTER — Encounter (HOSPITAL_COMMUNITY): Payer: Self-pay | Admitting: *Deleted

## 2016-02-26 DIAGNOSIS — W19XXXA Unspecified fall, initial encounter: Secondary | ICD-10-CM | POA: Diagnosis not present

## 2016-02-26 DIAGNOSIS — I129 Hypertensive chronic kidney disease with stage 1 through stage 4 chronic kidney disease, or unspecified chronic kidney disease: Secondary | ICD-10-CM | POA: Diagnosis not present

## 2016-02-26 DIAGNOSIS — Z9842 Cataract extraction status, left eye: Secondary | ICD-10-CM | POA: Insufficient documentation

## 2016-02-26 DIAGNOSIS — Z90710 Acquired absence of both cervix and uterus: Secondary | ICD-10-CM | POA: Diagnosis not present

## 2016-02-26 DIAGNOSIS — E119 Type 2 diabetes mellitus without complications: Secondary | ICD-10-CM | POA: Diagnosis not present

## 2016-02-26 DIAGNOSIS — Z8673 Personal history of transient ischemic attack (TIA), and cerebral infarction without residual deficits: Secondary | ICD-10-CM | POA: Diagnosis not present

## 2016-02-26 DIAGNOSIS — S52501A Unspecified fracture of the lower end of right radius, initial encounter for closed fracture: Secondary | ICD-10-CM | POA: Diagnosis not present

## 2016-02-26 DIAGNOSIS — E1142 Type 2 diabetes mellitus with diabetic polyneuropathy: Secondary | ICD-10-CM | POA: Diagnosis not present

## 2016-02-26 DIAGNOSIS — S52591A Other fractures of lower end of right radius, initial encounter for closed fracture: Secondary | ICD-10-CM | POA: Diagnosis not present

## 2016-02-26 DIAGNOSIS — Z811 Family history of alcohol abuse and dependence: Secondary | ICD-10-CM | POA: Insufficient documentation

## 2016-02-26 DIAGNOSIS — F329 Major depressive disorder, single episode, unspecified: Secondary | ICD-10-CM | POA: Insufficient documentation

## 2016-02-26 DIAGNOSIS — Z8249 Family history of ischemic heart disease and other diseases of the circulatory system: Secondary | ICD-10-CM | POA: Diagnosis not present

## 2016-02-26 DIAGNOSIS — S52501P Unspecified fracture of the lower end of right radius, subsequent encounter for closed fracture with malunion: Secondary | ICD-10-CM

## 2016-02-26 DIAGNOSIS — E1151 Type 2 diabetes mellitus with diabetic peripheral angiopathy without gangrene: Secondary | ICD-10-CM | POA: Diagnosis not present

## 2016-02-26 DIAGNOSIS — Z9841 Cataract extraction status, right eye: Secondary | ICD-10-CM | POA: Diagnosis not present

## 2016-02-26 DIAGNOSIS — S52571P Other intraarticular fracture of lower end of right radius, subsequent encounter for closed fracture with malunion: Secondary | ICD-10-CM | POA: Diagnosis not present

## 2016-02-26 DIAGNOSIS — I447 Left bundle-branch block, unspecified: Secondary | ICD-10-CM | POA: Diagnosis not present

## 2016-02-26 DIAGNOSIS — E785 Hyperlipidemia, unspecified: Secondary | ICD-10-CM | POA: Diagnosis not present

## 2016-02-26 DIAGNOSIS — S6291XA Unspecified fracture of right wrist and hand, initial encounter for closed fracture: Secondary | ICD-10-CM | POA: Diagnosis not present

## 2016-02-26 DIAGNOSIS — G8918 Other acute postprocedural pain: Secondary | ICD-10-CM | POA: Diagnosis not present

## 2016-02-26 DIAGNOSIS — K219 Gastro-esophageal reflux disease without esophagitis: Secondary | ICD-10-CM | POA: Diagnosis not present

## 2016-02-26 DIAGNOSIS — T148XXA Other injury of unspecified body region, initial encounter: Secondary | ICD-10-CM

## 2016-02-26 DIAGNOSIS — M25531 Pain in right wrist: Secondary | ICD-10-CM | POA: Diagnosis not present

## 2016-02-26 DIAGNOSIS — Z7984 Long term (current) use of oral hypoglycemic drugs: Secondary | ICD-10-CM | POA: Diagnosis not present

## 2016-02-26 DIAGNOSIS — N183 Chronic kidney disease, stage 3 (moderate): Secondary | ICD-10-CM | POA: Diagnosis not present

## 2016-02-26 DIAGNOSIS — E1122 Type 2 diabetes mellitus with diabetic chronic kidney disease: Secondary | ICD-10-CM | POA: Diagnosis not present

## 2016-02-26 HISTORY — PX: ORIF WRIST FRACTURE: SHX2133

## 2016-02-26 LAB — GLUCOSE, CAPILLARY
GLUCOSE-CAPILLARY: 144 mg/dL — AB (ref 65–99)
GLUCOSE-CAPILLARY: 164 mg/dL — AB (ref 65–99)

## 2016-02-26 SURGERY — OPEN REDUCTION INTERNAL FIXATION (ORIF) WRIST FRACTURE
Anesthesia: Regional | Site: Wrist | Laterality: Right

## 2016-02-26 MED ORDER — EPHEDRINE SULFATE 50 MG/ML IJ SOLN
INTRAMUSCULAR | Status: DC | PRN
  Administered 2016-02-26: 15 mg via INTRAVENOUS

## 2016-02-26 MED ORDER — MIDAZOLAM HCL 2 MG/2ML IJ SOLN
1.0000 mg | Freq: Once | INTRAMUSCULAR | Status: AC
  Administered 2016-02-26: 1 mg via INTRAVENOUS

## 2016-02-26 MED ORDER — PROPOFOL 10 MG/ML IV BOLUS
INTRAVENOUS | Status: DC | PRN
  Administered 2016-02-26: 30 mg via INTRAVENOUS
  Administered 2016-02-26: 200 mg via INTRAVENOUS

## 2016-02-26 MED ORDER — PROPOFOL 10 MG/ML IV BOLUS
INTRAVENOUS | Status: AC
  Filled 2016-02-26: qty 20

## 2016-02-26 MED ORDER — FENTANYL CITRATE (PF) 100 MCG/2ML IJ SOLN
INTRAMUSCULAR | Status: AC
  Filled 2016-02-26: qty 2

## 2016-02-26 MED ORDER — MIDAZOLAM HCL 2 MG/2ML IJ SOLN
INTRAMUSCULAR | Status: AC
  Filled 2016-02-26: qty 4

## 2016-02-26 MED ORDER — BUPIVACAINE-EPINEPHRINE (PF) 0.5% -1:200000 IJ SOLN
INTRAMUSCULAR | Status: DC | PRN
  Administered 2016-02-26: 20 mL

## 2016-02-26 MED ORDER — MIDAZOLAM HCL 5 MG/5ML IJ SOLN
INTRAMUSCULAR | Status: DC | PRN
  Administered 2016-02-26: 2 mg via INTRAVENOUS

## 2016-02-26 MED ORDER — LIDOCAINE HCL (PF) 1 % IJ SOLN
INTRAMUSCULAR | Status: AC
  Filled 2016-02-26: qty 5

## 2016-02-26 MED ORDER — BUPIVACAINE-EPINEPHRINE (PF) 0.5% -1:200000 IJ SOLN
INTRAMUSCULAR | Status: DC | PRN
  Administered 2016-02-26: 30 mL via PERINEURAL

## 2016-02-26 MED ORDER — LACTATED RINGERS IV SOLN
INTRAVENOUS | Status: DC
  Administered 2016-02-26: 1000 mL via INTRAVENOUS

## 2016-02-26 MED ORDER — ONDANSETRON HCL 4 MG/2ML IJ SOLN: 4.0000 mg | Freq: Four times a day (QID) | INTRAMUSCULAR | Status: DC | PRN

## 2016-02-26 MED ORDER — OXYCODONE HCL 5 MG PO TABS: 5.0000 mg | ORAL_TABLET | Freq: Once | ORAL | Status: DC | PRN

## 2016-02-26 MED ORDER — CHLORHEXIDINE GLUCONATE 4 % EX LIQD: 60.0000 mL | Freq: Once | CUTANEOUS | Status: DC

## 2016-02-26 MED ORDER — SODIUM CHLORIDE 0.9 % IR SOLN
Status: DC | PRN
  Administered 2016-02-26: 1000 mL

## 2016-02-26 MED ORDER — FENTANYL CITRATE (PF) 100 MCG/2ML IJ SOLN
50.0000 ug | Freq: Once | INTRAMUSCULAR | Status: AC
  Administered 2016-02-26: 50 ug via INTRAVENOUS

## 2016-02-26 MED ORDER — BUPIVACAINE-EPINEPHRINE (PF) 0.5% -1:200000 IJ SOLN
INTRAMUSCULAR | Status: AC
  Filled 2016-02-26: qty 30

## 2016-02-26 MED ORDER — PHENYLEPHRINE HCL 10 MG/ML IJ SOLN
INTRAMUSCULAR | Status: DC | PRN
  Administered 2016-02-26 (×2): 80 ug via INTRAVENOUS
  Administered 2016-02-26: 40 ug via INTRAVENOUS

## 2016-02-26 MED ORDER — CEFAZOLIN SODIUM-DEXTROSE 2-4 GM/100ML-% IV SOLN
2.0000 g | INTRAVENOUS | Status: AC
  Administered 2016-02-26: 2 g via INTRAVENOUS
  Filled 2016-02-26: qty 100

## 2016-02-26 MED ORDER — HYDROCODONE-ACETAMINOPHEN 5-325 MG PO TABS: 1.0000 | ORAL_TABLET | ORAL | 0 refills | Status: DC | PRN

## 2016-02-26 MED ORDER — OXYCODONE HCL 5 MG/5ML PO SOLN: 5.0000 mg | Freq: Once | ORAL | Status: DC | PRN

## 2016-02-26 MED ORDER — HYDROMORPHONE HCL 1 MG/ML IJ SOLN: 0.2500 mg | INTRAMUSCULAR | Status: DC | PRN

## 2016-02-26 MED ORDER — FENTANYL CITRATE (PF) 100 MCG/2ML IJ SOLN
INTRAMUSCULAR | Status: DC | PRN
  Administered 2016-02-26: 25 ug via INTRAVENOUS

## 2016-02-26 MED ORDER — LIDOCAINE HCL 1 % IJ SOLN
INTRAMUSCULAR | Status: DC | PRN
  Administered 2016-02-26: 25 mg via INTRADERMAL

## 2016-02-26 MED ORDER — MIDAZOLAM HCL 2 MG/2ML IJ SOLN
INTRAMUSCULAR | Status: AC
  Filled 2016-02-26: qty 2

## 2016-02-26 SURGICAL SUPPLY — 52 items
BAG HAMPER (MISCELLANEOUS) ×3 IMPLANT
BANDAGE ELASTIC 3 VELCRO ST LF (GAUZE/BANDAGES/DRESSINGS) ×3 IMPLANT
BANDAGE ESMARK 4X12 BL STRL LF (DISPOSABLE) ×1 IMPLANT
BIT DRILL 2 FAST STEP (BIT) ×3 IMPLANT
BIT DRILL 2.5X125 (BIT) ×3 IMPLANT
BNDG COHESIVE 4X5 TAN STRL (GAUZE/BANDAGES/DRESSINGS) ×3 IMPLANT
BNDG ESMARK 4X12 BLUE STRL LF (DISPOSABLE) ×3
CHLORAPREP W/TINT 26ML (MISCELLANEOUS) ×3 IMPLANT
CLOTH BEACON ORANGE TIMEOUT ST (SAFETY) ×3 IMPLANT
COVER LIGHT HANDLE STERIS (MISCELLANEOUS) ×6 IMPLANT
COVER PROBE W GEL 5X96 (DRAPES) ×3 IMPLANT
CUFF TOURNIQUET SINGLE 18IN (TOURNIQUET CUFF) ×3 IMPLANT
DRAPE C-ARM FOLDED MOBILE STRL (DRAPES) ×3 IMPLANT
DRSG XEROFORM 1X8 (GAUZE/BANDAGES/DRESSINGS) ×3 IMPLANT
ELECT REM PT RETURN 9FT ADLT (ELECTROSURGICAL) ×3
ELECTRODE REM PT RTRN 9FT ADLT (ELECTROSURGICAL) ×1 IMPLANT
GAUZE SPONGE 4X4 12PLY STRL (GAUZE/BANDAGES/DRESSINGS) ×3 IMPLANT
GLOVE BIOGEL PI IND STRL 7.0 (GLOVE) ×1 IMPLANT
GLOVE BIOGEL PI INDICATOR 7.0 (GLOVE) ×2
GLOVE SKINSENSE NS SZ8.0 LF (GLOVE) ×2
GLOVE SKINSENSE STRL SZ8.0 LF (GLOVE) ×1 IMPLANT
GLOVE SS N UNI LF 8.5 STRL (GLOVE) ×3 IMPLANT
GOWN STRL REUS W/TWL LRG LVL3 (GOWN DISPOSABLE) ×6 IMPLANT
GOWN STRL REUS W/TWL XL LVL3 (GOWN DISPOSABLE) ×3 IMPLANT
GUIDEWIRE ORTH 6X062XTROC NS (WIRE) ×2 IMPLANT
K-WIRE .062 (WIRE) ×4
KIT ROOM TURNOVER APOR (KITS) ×3 IMPLANT
MANIFOLD NEPTUNE II (INSTRUMENTS) ×3 IMPLANT
NEEDLE HYPO 21X1.5 SAFETY (NEEDLE) ×3 IMPLANT
NS IRRIG 1000ML POUR BTL (IV SOLUTION) ×3 IMPLANT
PACK BASIC LIMB (CUSTOM PROCEDURE TRAY) ×3 IMPLANT
PAD ARMBOARD 7.5X6 YLW CONV (MISCELLANEOUS) ×3 IMPLANT
PADDING WEBRIL 4 STERILE (GAUZE/BANDAGES/DRESSINGS) ×3 IMPLANT
PEG SUBCHONDRAL SMOOTH 2.0X12 (Screw) ×3 IMPLANT
PEG SUBCHONDRAL SMOOTH 2.0X14 (Peg) ×3 IMPLANT
PEG SUBCHONDRAL SMOOTH 2.0X16 (Peg) ×6 IMPLANT
PEG SUBCHONDRAL SMOOTH 2.0X18 (Peg) ×3 IMPLANT
PEG THREADED 2.5MMX22MM LONG (Peg) ×3 IMPLANT
PEG THREADED 2.5MMX24MM LONG (Peg) ×3 IMPLANT
PLATE SHORT 24.4X51.3 RT (Plate) ×3 IMPLANT
SCREW CORT 3.5X10 LNG (Screw) ×6 IMPLANT
SCREW CORT 3.5X14 LNG (Screw) ×3 IMPLANT
SCREW MULTI DIRECT 16MM (Screw) ×3 IMPLANT
SET BASIN LINEN APH (SET/KITS/TRAYS/PACK) ×3 IMPLANT
SPLINT IMMOBILIZER J 3INX20FT (CAST SUPPLIES) ×2
SPLINT J IMMOBILIZER 3X20FT (CAST SUPPLIES) ×1 IMPLANT
STAPLER VISISTAT 35W (STAPLE) ×3 IMPLANT
SUT ETHILON 3 0 FSL (SUTURE) IMPLANT
SUT MON AB 2-0 SH 27 (SUTURE) ×2
SUT MON AB 2-0 SH27 (SUTURE) ×1 IMPLANT
SYRINGE 10CC LL (SYRINGE) ×3 IMPLANT
WATER STERILE IRR 1000ML POUR (IV SOLUTION) ×3 IMPLANT

## 2016-02-26 NOTE — Transfer of Care (Signed)
Immediate Anesthesia Transfer of Care Note  Patient: MARTHELLA OSORNO  Procedure(s) Performed: Procedure(s) with comments: OPEN REDUCTION INTERNAL FIXATION (ORIF) WRIST FRACTURE (Right) - TIME 12:30  Patient Location: PACU  Anesthesia Type:General and Regional  Level of Consciousness: awake and patient cooperative  Airway & Oxygen Therapy: Patient Spontanous Breathing and Patient connected to face mask oxygen  Post-op Assessment: Report given to RN, Post -op Vital signs reviewed and stable and Patient moving all extremities  Post vital signs: Reviewed and stable  Last Vitals:  Vitals:   02/26/16 1215 02/26/16 1220  BP: 117/61 (!) 121/57  Pulse:    Resp: (!) 22 20  Temp:      Last Pain:  Vitals:   02/26/16 1127  TempSrc: Oral  PainSc: 3       Patients Stated Pain Goal: 8 (48/18/59 0931)  Complications: No apparent anesthesia complications

## 2016-02-26 NOTE — Brief Op Note (Signed)
02/26/2016  1:52 PM  PATIENT:  Angela Harris  70 y.o. female  PRE-OPERATIVE DIAGNOSIS:  RIGHT WRIST FRACTURE  POST-OPERATIVE DIAGNOSIS:  RIGHT WRIST FRACTURE  PROCEDURE:  Procedure(s) with comments: OPEN REDUCTION INTERNAL FIXATION (ORIF) WRIST FRACTURE (Right) - TIME 12:30   Implant DVR plate with smooth and threaded pegs and 3 cortical screws  Details of procedure the patient was identified in the preop area Angela Harris was reinterviewed and examined and found deemed necessary and appropriate for surgery including the surgical site. We marked the surgical site. Reviewed chart. We took her to surgery with the surgical team. She had general anesthesia with LMA. Addendum she also had a paracervical block in the preop area  She was placed supine her arm was prepped and draped sterilely with ChloraPrep  Timeout was completed  Tourniquet was elevated after exsanguination of the limb with a 4 to Esmarch to 250 mmHg.  I first attempted closed reduction under C-arm and the fracture was slightly but not completely mobile  I then made a volar incision over the flexor carpi radialis tendon. I divided the subcutaneous tissue down to the tendon opened up the tendon sheath performed blunt dissection down to the native quadratus and incised it and reflected ulnarly. Expose the fracture placed a periosteal elevator and the fracture site and manipulated it and reduced the fracture and held with a K wire. X-ray confirmed the reduction  A K wire in and then applied a volar plate distal portion first using smoothen threaded pegs. I then obtained length and volar tilt and attach the plate to bone using 0-9.8 screws  Final x-rays confirmed reduction and plate position including the radial inclination view to check for screws in the joint which there were none  Irrigation was performed  Wound was closed with 2-0 Monocryl and staples. -  I applied a volar splint with the tourniquet down and  observe good capillary refill and color and the fingers   SURGEON:  Surgeon(s) and Role:    * Carole Civil, MD - Primary  PHYSICIAN ASSISTANT:   ASSISTANTS: BETTY ASHLEY    ANESTHESIA:   general and paracervical block  EBL:  Total I/O In: 600 [I.V.:600] Out: 25 [Blood:25]  BLOOD ADMINISTERED:none  DRAINS: none   LOCAL MEDICATIONS USED:  MARCAINE   , Amount: 20 ml and OTHER WITH EPI  SPECIMEN:  No Specimen  DISPOSITION OF SPECIMEN:  N/A  COUNTS:  YES  TOURNIQUET:   Total Tourniquet Time Documented: Upper Arm (Right) - 56 minutes Total: Upper Arm (Right) - 56 minutes   DICTATION: .Viviann Spare Dictation  PLAN OF CARE: Discharge to home after PACU  PATIENT DISPOSITION:  PACU - hemodynamically stable.   Delay start of Pharmacological VTE agent (>24hrs) due to surgical blood loss or risk of bleeding: not applicable

## 2016-02-26 NOTE — Anesthesia Postprocedure Evaluation (Signed)
Anesthesia Post Note  Patient: Angela Harris  Procedure(s) Performed: Procedure(s) (LRB): OPEN REDUCTION INTERNAL FIXATION (ORIF) WRIST FRACTURE (Right)  Patient location during evaluation: PACU Anesthesia Type: General and Regional Level of consciousness: awake and alert, oriented and patient cooperative Pain management: pain level controlled Vital Signs Assessment: post-procedure vital signs reviewed and stable Respiratory status: spontaneous breathing, nonlabored ventilation and respiratory function stable Cardiovascular status: blood pressure returned to baseline and stable Postop Assessment: no signs of nausea or vomiting Anesthetic complications: no    Last Vitals:  Vitals:   02/26/16 1355 02/26/16 1415  BP: (!) 108/51 (!) 115/54  Pulse: 74 84  Resp: (!) 5 (!) 22  Temp: 36.5 C     Last Pain:  Vitals:   02/26/16 1127  TempSrc: Oral  PainSc: 3                  Delaney Perona J

## 2016-02-26 NOTE — Anesthesia Procedure Notes (Signed)
Anesthesia Regional Block:  Supraclavicular block  Pre-Anesthetic Checklist: ,, timeout performed, Correct Patient, Correct Site, Correct Laterality, Correct Procedure, Correct Position, site marked, Risks and benefits discussed,  Surgical consent,  Pre-op evaluation,  At surgeon's request and post-op pain management  Laterality: Right  Prep: chloraprep       Needles:  Injection technique: Single-shot  Needle Type: Echogenic Stimulator Needle     Needle Length: 5cm 5 cm Needle Gauge: 22 and 22 G    Additional Needles:  Procedures: ultrasound guided (picture in chart) and nerve stimulator Supraclavicular block  Nerve Stimulator or Paresthesia:  Response: biceps flexion, 0.45 mA,   Additional Responses:   Narrative:  Start time: 02/26/2016 12:03 PM End time: 02/26/2016 12:13 PM Injection made incrementally with aspirations every 5 mL.  Performed by: Personally  Anesthesiologist: Brantly Kalman  Additional Notes: Functioning IV was confirmed and monitors were applied.  A 68mm 22ga Arrow echogenic stimulator needle was used. Sterile prep and drape,hand hygiene and sterile gloves were used.  Negative aspiration and negative test dose prior to incremental administration of local anesthetic. The patient tolerated the procedure well.  Ultrasound guidance: relevent anatomy identified, needle position confirmed, local anesthetic spread visualized around nerve(s), vascular puncture avoided.  Image printed for medical record.

## 2016-02-26 NOTE — H&P (Signed)
Angela Harris is an 70 y.o. female.   Chief Complaint: Right wrist fracture HPI: 70 years old fell weekend prior to September 26 injured her right wrist Initial closed reduction was done in [place] she was followed for 2 and half weeks loss position of the fracture presents for internal fixation and correction of deformity.  Pain for 2-1/2 weeks pain over the right wrist nonradiating mild in severity constant improved with casting    Past Medical History:  Diagnosis Date  . CKD (chronic kidney disease) stage 3, GFR 30-59 ml/min   . Depression   . Dysrhythmia    LBBB  . Essential hypertension   . GERD (gastroesophageal reflux disease)   . History of TIA (transient ischemic attack)   . Hyperlipidemia   . Meniere disease    pt denies  . Peripheral neuropathy ([place])   . Stroke [facility]) [address]   "mini-stroke". No deficits from this  . Type 2 diabetes mellitus ([place])     Past Surgical History:  Procedure Laterality Date  . ABDOMINAL HYSTERECTOMY    . BACK SURGERY    . CATARACT EXTRACTION, BILATERAL    . CHOLECYSTECTOMY    . Closed reduction right wrist fracture    . ECTOPIC PREGNANCY SURGERY    . TONSILLECTOMY      Family History  Problem Relation Age of Onset  . Alzheimer's disease Mother   . Thyroid disease Mother   . Hypertension Mother   . Alcoholism Father   . Hypertension Father   . Alcoholism Sister   . Hypertension Sister   . Alcoholism Brother   . Hypertension Brother    Social History:  reports that she has never smoked. She has never used smokeless tobacco. She reports that she does not drink alcohol or use drugs.  Allergies: No Known Allergies  No prescriptions prior to admission.    No results found for this or any previous visit (from the past 48 hour(s)). No results found.  ROS imported from prior notes Review of Systems  HENT: Positive for congestion.   Respiratory: Negative for cough and shortness of breath.   Cardiovascular: Negative for  chest pain and leg swelling.  Endocrine: Negative for cold intolerance.  Musculoskeletal: Positive for arthralgias and joint swelling.  Allergic/Immunologic: Negative for environmental allergies.  All other systems reviewed were normal   Blood pressure 137/66, pulse 70, temperature 97.5 F (36.4 C), temperature source Oral, resp. rate (!) 24, height 5' 4.5" (1.638 m), weight 172 lb (78 kg), SpO2 96 %. Physical Exam  Constitutional: She is oriented to person, place, and time. She appears well-nourished.  Eyes: Right eye exhibits no discharge. Left eye exhibits no discharge. No scleral icterus.  Neck: Neck supple. No JVD present. No tracheal deviation present.  Cardiovascular: Intact distal pulses.   Respiratory: Effort normal. No stridor.  GI: Soft. She exhibits no distension.  Neurological: She is alert and oriented to person, place, and time. She has normal reflexes. She exhibits normal muscle tone. Coordination normal.  Skin: Skin is warm and dry. No rash noted. No erythema. No pallor.  Psychiatric: She has a normal mood and affect. Her behavior is normal. Thought content normal.  Lower extremities no clubbing cyanosis or edema normal alignment range of motion stability strength skin pulse and sensation  Right upper extremity tender distal radius slight deformity decreased range of motion especially flexion and pronation supination no instability and strength is normal skin is intact pulses are good sensation is normal  Lymph  nodes in the upper extremities normal  Left upper extremity normal without clubbing cyanosis or edema  Assessment/Plan Displaced distal radius fracture right upper extremity 42-1/2 weeks old with deformity  Open treatment internal fixation right distal radius  Arther Abbott, MD 02/26/2016, 10:33 AM

## 2016-02-26 NOTE — Anesthesia Preprocedure Evaluation (Signed)
Anesthesia Evaluation  Patient identified by MRN, date of birth, ID band Patient awake    Reviewed: Allergy & Precautions, H&P , NPO status , Patient's Chart, lab work & pertinent test results  Airway Mallampati: II   Neck ROM: full    Dental   Pulmonary neg pulmonary ROS,    breath sounds clear to auscultation       Cardiovascular hypertension, + Peripheral Vascular Disease   Rhythm:regular Rate:Normal  LBBB   Neuro/Psych PSYCHIATRIC DISORDERS Depression TIA   GI/Hepatic GERD  ,  Endo/Other  diabetes, Type 2  Renal/GU Renal InsufficiencyRenal disease     Musculoskeletal   Abdominal   Peds  Hematology   Anesthesia Other Findings   Reproductive/Obstetrics                             Anesthesia Physical Anesthesia Plan  ASA: III  Anesthesia Plan: General and Regional   Post-op Pain Management:  Regional for Post-op pain   Induction: Intravenous  Airway Management Planned: LMA  Additional Equipment:   Intra-op Plan:   Post-operative Plan:   Informed Consent: I have reviewed the patients History and Physical, chart, labs and discussed the procedure including the risks, benefits and alternatives for the proposed anesthesia with the patient or authorized representative who has indicated his/her understanding and acceptance.     Plan Discussed with: CRNA, Anesthesiologist and Surgeon  Anesthesia Plan Comments:         Anesthesia Quick Evaluation

## 2016-02-26 NOTE — Anesthesia Procedure Notes (Signed)
Procedure Name: LMA Insertion Date/Time: 02/26/2016 12:32 PM Performed by: Charmaine Downs Pre-anesthesia Checklist: Patient identified, Patient being monitored, Emergency Drugs available, Timeout performed and Suction available Patient Re-evaluated:Patient Re-evaluated prior to inductionOxygen Delivery Method: Circle System Utilized Preoxygenation: Pre-oxygenation with 100% oxygen Intubation Type: IV induction Ventilation: Mask ventilation without difficulty LMA: LMA inserted LMA Size: 4.0 Number of attempts: 1 Placement Confirmation: positive ETCO2 and breath sounds checked- equal and bilateral Tube secured with: Tape Dental Injury: Teeth and Oropharynx as per pre-operative assessment

## 2016-02-26 NOTE — Op Note (Signed)
02/26/2016  1:52 PM  PATIENT:  Angela Harris  70 y.o. female  PRE-OPERATIVE DIAGNOSIS:  RIGHT WRIST FRACTURE  POST-OPERATIVE DIAGNOSIS:  RIGHT WRIST FRACTURE  PROCEDURE:  Procedure(s) with comments: OPEN REDUCTION INTERNAL FIXATION (ORIF) WRIST FRACTURE (Right) - TIME 12:30   Surgical findings partially healed fracture with dorsal tilt it was a three-part fracture with mild intra-articular involvement, mild shortening.  Implant DVR plate with smooth and threaded pegs and 3 cortical screws  Details of procedure the patient was identified in the preop area [place] was reinterviewed and examined and found deemed necessary and appropriate for surgery including the surgical site. We marked the surgical site. Reviewed chart. We took her to surgery with the surgical team. She had general anesthesia with LMA. Addendum she also had a paracervical block in the preop area  She was placed supine her arm was prepped and draped sterilely with ChloraPrep  Timeout was completed  Tourniquet was elevated after exsanguination of the limb with a 4 to Esmarch to 250 mmHg.  I first attempted closed reduction under C-arm and the fracture was slightly but not completely mobile  I then made a volar incision over the flexor carpi radialis tendon. I divided the subcutaneous tissue down to the tendon opened up the tendon sheath performed blunt dissection down to the native quadratus and incised it and reflected ulnarly. Expose the fracture placed a periosteal elevator and the fracture site and manipulated it and reduced the fracture and held with a K wire. X-ray confirmed the reduction  A K wire in and then applied a volar plate distal portion first using smoothen threaded pegs. I then obtained length and volar tilt and attach the plate to bone using 5-7.0 screws  Final x-rays confirmed reduction and plate position including the radial inclination view to check for screws in the joint which there  were none  Irrigation was performed  Wound was closed with 2-0 Monocryl and staples. -  I applied a volar splint with the tourniquet down and observe good capillary refill and color and the fingers   SURGEON:  Surgeon(s) and Role:    * Carole Civil, MD - Primary  PHYSICIAN ASSISTANT:   ASSISTANTS: BETTY ASHLEY    ANESTHESIA:   general and paracervical block  EBL:  Total I/O In: 600 [I.V.:600] Out: 25 [Blood:25]  BLOOD ADMINISTERED:none  DRAINS: none   LOCAL MEDICATIONS USED:  MARCAINE   , Amount: 20 ml and OTHER WITH EPI  SPECIMEN:  No Specimen  DISPOSITION OF SPECIMEN:  N/A  COUNTS:  YES  TOURNIQUET:   Total Tourniquet Time Documented: Upper Arm (Right) - 56 minutes Total: Upper Arm (Right) - 56 minutes   DICTATION: .Viviann Spare Dictation  PLAN OF CARE: Discharge to home after PACU  PATIENT DISPOSITION:  PACU - hemodynamically stable.   Delay start of Pharmacological VTE agent (>24hrs) due to surgical blood loss or risk of bleeding: not applicable

## 2016-02-26 NOTE — Interval H&P Note (Signed)
History and Physical Interval Note:  02/26/2016 12:13 PM  Angela Harris  has presented today for surgery, with the diagnosis of RIGHT WRIST FRACTURE  The various methods of treatment have been discussed with the patient and family. After consideration of risks, benefits and other options for treatment, the patient has consented to  Procedure(s) with comments: OPEN REDUCTION INTERNAL FIXATION (ORIF) WRIST FRACTURE (Right) - TIME 12:30 as a surgical intervention .  The patient's history has been reviewed, patient examined, no change in status, stable for surgery.  I have reviewed the patient's chart and labs.  Questions were answered to the patient's satisfaction.     Arther Abbott

## 2016-02-26 NOTE — Discharge Instructions (Signed)

## 2016-02-27 ENCOUNTER — Telehealth: Payer: Self-pay | Admitting: Orthopedic Surgery

## 2016-02-27 NOTE — Telephone Encounter (Signed)
Called patient; aware of appointment tomorrow morning, 02/28/16.

## 2016-02-27 NOTE — Addendum Note (Signed)
Addendum  created 02/27/16 1004 by Ollen Bowl, CRNA   Charge Capture section accepted

## 2016-02-27 NOTE — Telephone Encounter (Signed)
Patient called (left voice message today, 5:05am, 02/27/16) relaying that she has been in quite a bit of pain through the night, status/post right wrist fracture surgery 02/26/16.  I returned the call this morning at 9:15am, reached patient. Patient is scheduled for post op#1 appointment Monday, 03/02/16, 9:00a.m.  Her hospital discharge instructions read:   - patient states she has been following (per chart notes)  "Discharge instructions"   "Keep hand elevated above the heart and move fingers frequently  Increase activity slowly   Leave dressing on - Keep it clean, dry, and intact until clinic visit   Lifting restrictions   Do not lift with operative hand  Toothbrush ok  8 oz glass of water ok"  Please advise.  Patient's ph# [phone].

## 2016-02-27 NOTE — Telephone Encounter (Signed)
COME IN FRI FOR DRESSING CHANGE AND POSSIBLE MEDICATION CHANGE

## 2016-02-27 NOTE — Telephone Encounter (Signed)
ROUTING TO DR HARRISON 

## 2016-02-27 NOTE — Telephone Encounter (Signed)
Please arrange

## 2016-02-28 ENCOUNTER — Encounter: Payer: Self-pay | Admitting: Orthopedic Surgery

## 2016-02-28 ENCOUNTER — Ambulatory Visit (INDEPENDENT_AMBULATORY_CARE_PROVIDER_SITE_OTHER): Payer: Medicare Other | Admitting: Orthopedic Surgery

## 2016-02-28 DIAGNOSIS — S62101D Fracture of unspecified carpal bone, right wrist, subsequent encounter for fracture with routine healing: Secondary | ICD-10-CM | POA: Diagnosis not present

## 2016-02-28 NOTE — Progress Notes (Signed)
Patient ID: Angela Harris, female   DOB: Mar 26, 1946, 70 y.o.   MRN: [id number]  Post op visit   Chief Complaint  Patient presents with  . Follow-up    POST OP 1 ORIF RT WRIST, DOS 02/26/16      Angela Harris came in for increased pain which has now resolved. Apparently she had a severe pain episode after her block wore off.  She received a supraclavicular block had no pain and then all of a sudden had a severe amount of pain. However she get a full night sleep last night without taking any pain medicine  Just to cover all the bases we took everything down in terms of the dressing her wound looks fine her fingers show some swelling she has good motion no numbness or tingling we put a new splint on and she will keep her regular appointment

## 2016-03-02 ENCOUNTER — Encounter: Payer: Self-pay | Admitting: Orthopedic Surgery

## 2016-03-02 ENCOUNTER — Ambulatory Visit (INDEPENDENT_AMBULATORY_CARE_PROVIDER_SITE_OTHER): Payer: Medicare Other | Admitting: Orthopedic Surgery

## 2016-03-02 DIAGNOSIS — Z4889 Encounter for other specified surgical aftercare: Secondary | ICD-10-CM

## 2016-03-02 NOTE — Progress Notes (Signed)
Pod # 6 takedown of fracture and open treatment internal fixation volar plate right wrist.  Patient is doing well pain is well-controlled  She'll come back on the first have the staples out and she will be placed in a removable Velcro splint  Follow-up after that in 2 weeks

## 2016-03-11 ENCOUNTER — Ambulatory Visit (INDEPENDENT_AMBULATORY_CARE_PROVIDER_SITE_OTHER): Payer: Medicare Other | Admitting: Orthopaedic Surgery

## 2016-03-11 DIAGNOSIS — Z4889 Encounter for other specified surgical aftercare: Secondary | ICD-10-CM

## 2016-03-11 MED ORDER — HYDROCODONE-ACETAMINOPHEN 5-325 MG PO TABS: 1.0000 | ORAL_TABLET | Freq: Four times a day (QID) | ORAL | 0 refills | Status: DC | PRN

## 2016-03-11 NOTE — Progress Notes (Signed)
Patient seen by staff for wound check for Dr. Aline Brochure.

## 2016-03-11 NOTE — Progress Notes (Signed)
Right wrist splint removed, wound cleaned and staples removed, no signs or symptoms of infection, steri strips and cock up splint applied. 2 week follow up made.

## 2016-03-12 ENCOUNTER — Other Ambulatory Visit: Payer: Medicare Other

## 2016-03-17 ENCOUNTER — Ambulatory Visit (INDEPENDENT_AMBULATORY_CARE_PROVIDER_SITE_OTHER): Payer: Medicare Other | Admitting: Orthopedic Surgery

## 2016-03-17 ENCOUNTER — Encounter: Payer: Self-pay | Admitting: Orthopedic Surgery

## 2016-03-17 DIAGNOSIS — T814XXA Infection following a procedure, initial encounter: Secondary | ICD-10-CM

## 2016-03-17 DIAGNOSIS — IMO0001 Reserved for inherently not codable concepts without codable children: Secondary | ICD-10-CM

## 2016-03-17 DIAGNOSIS — Z4889 Encounter for other specified surgical aftercare: Secondary | ICD-10-CM

## 2016-03-17 MED ORDER — SULFAMETHOXAZOLE-TRIMETHOPRIM 800-160 MG PO TABS: 1.0000 | ORAL_TABLET | Freq: Two times a day (BID) | ORAL | 1 refills | Status: DC

## 2016-03-17 NOTE — Progress Notes (Signed)
Patient ID: Angela Harris, female   DOB: 1945-09-03, 70 y.o.   MRN: [id number]  Post op visit   Chief Complaint  Patient presents with  . Follow-up    right wrist wound check, complains of odor, ORIF RT WRIST 02/26/16    3 weeks post op   Distal aspect wound opening With surrounding cellulitis and tenderness  No increase in pain  Encounter Diagnoses  Name Primary?  . Postoperative cellulitis of surgical wound, initial encounter Yes  . Aftercare following surgery     Recommend Neosporin twice a day, keep covered keep dry. Keep brace on for 6 weeks from surgery. Keep appointment for November 15.  Check wound only x-ray will be done at postop week #6

## 2016-03-17 NOTE — Patient Instructions (Signed)
Neosporin twice a day  Start oral antibiotic  5 lbs weight limit  Wear brace 6 weeks

## 2016-03-25 ENCOUNTER — Encounter: Payer: Self-pay | Admitting: Orthopedic Surgery

## 2016-03-25 ENCOUNTER — Ambulatory Visit (INDEPENDENT_AMBULATORY_CARE_PROVIDER_SITE_OTHER): Payer: Self-pay | Admitting: Orthopedic Surgery

## 2016-03-25 DIAGNOSIS — T814XXA Infection following a procedure, initial encounter: Secondary | ICD-10-CM

## 2016-03-25 DIAGNOSIS — IMO0001 Reserved for inherently not codable concepts without codable children: Secondary | ICD-10-CM

## 2016-03-25 DIAGNOSIS — Z4889 Encounter for other specified surgical aftercare: Secondary | ICD-10-CM

## 2016-03-25 MED ORDER — SULFAMETHOXAZOLE-TRIMETHOPRIM 800-160 MG PO TABS: 1.0000 | ORAL_TABLET | Freq: Two times a day (BID) | ORAL | 1 refills | Status: DC

## 2016-03-25 MED ORDER — HYDROCODONE-ACETAMINOPHEN 5-325 MG PO TABS: 1.0000 | ORAL_TABLET | Freq: Four times a day (QID) | ORAL | 0 refills | Status: DC | PRN

## 2016-03-25 NOTE — Progress Notes (Signed)
Patient ID: Angela Harris, female   DOB: [birth year], 70 y.o.   MRN: [id number]  Post op visit   Chief Complaint  Patient presents with  . Follow-up    ORIF RT WRIST, DOS 02/26/16   Patient comes in today for check on her right wrist surgical wound. We put her on oral antibiotics and asked her to place Neosporin over the wound which she did. Her wound has healed very well she has a little erythema and tenderness there but improving. She complains of some numbness at the tip of her right thumb  Her exam shows mild erythema over the prior incision right at the wrist crease but improved with no tenderness. She does have some decreased sensation only at the tip of her finger the rest of the fingers are normal she has good opposition of thumb to small finger and normal flexion of her fingers although there is some weakness  She will continue with the same regimen continue with hydrocodone for pain come back for x-ray at 6 weeks postop  Encounter Diagnoses  Name Primary?  . Postoperative cellulitis of surgical wound, initial encounter   . Aftercare following surgery Yes

## 2016-03-30 ENCOUNTER — Other Ambulatory Visit: Payer: Self-pay

## 2016-03-30 ENCOUNTER — Ambulatory Visit (INDEPENDENT_AMBULATORY_CARE_PROVIDER_SITE_OTHER): Payer: Medicare Other

## 2016-03-30 DIAGNOSIS — I447 Left bundle-branch block, unspecified: Secondary | ICD-10-CM

## 2016-03-30 DIAGNOSIS — R0609 Other forms of dyspnea: Secondary | ICD-10-CM | POA: Diagnosis not present

## 2016-04-01 ENCOUNTER — Telehealth: Payer: Self-pay | Admitting: *Deleted

## 2016-04-01 NOTE — Telephone Encounter (Signed)
Notes Recorded by Laurine Blazer, LPN on 81/15/7262 at 11:28 AM EST Patient notified and verbalized understanding. Copy to pmd. ------  Notes Recorded by Satira Sark, MD on 03/30/2016 at 4:11 PM EST Results reviewed. Echocardiogram was reassuring showing normal LVEF, no major valvular abnormalities. At this point no further cardiac testing is anticipated. Keep follow-up with Dr. Quillian Quince. A copy of this test should be forwarded to Gar Ponto, MD.

## 2016-04-07 ENCOUNTER — Ambulatory Visit: Payer: Medicare Other | Admitting: "Endocrinology

## 2016-04-08 ENCOUNTER — Ambulatory Visit (INDEPENDENT_AMBULATORY_CARE_PROVIDER_SITE_OTHER): Payer: Medicare Other | Admitting: Orthopedic Surgery

## 2016-04-08 ENCOUNTER — Ambulatory Visit (INDEPENDENT_AMBULATORY_CARE_PROVIDER_SITE_OTHER): Payer: Medicare Other

## 2016-04-08 ENCOUNTER — Encounter: Payer: Self-pay | Admitting: Orthopedic Surgery

## 2016-04-08 DIAGNOSIS — S62101D Fracture of unspecified carpal bone, right wrist, subsequent encounter for fracture with routine healing: Secondary | ICD-10-CM

## 2016-04-08 NOTE — Progress Notes (Signed)
Patient ID: Angela Harris, female   DOB: [birth year], 70 y.o.   MRN: [id number]  Post op visit   Chief Complaint  Patient presents with  . Follow-up    Rt wrist fracture, DOS 02/26/16     POD# 42  The postop superficial wound infection has cleared. The patient's pain is well-controlled without any opioids.  She is wearing her brace as prescribed  Her only real complaint is the tip of the thumb is still numb  X-ray in 6 weeks. Start exercising 3 times a day  Wear brace except for eating and when doing exercises, may remove at night

## 2016-04-08 NOTE — Patient Instructions (Signed)
Wear brace additional 6 weeks. May remove to eat.   exercises 3 times a day with brace off

## 2016-04-22 DIAGNOSIS — R5383 Other fatigue: Secondary | ICD-10-CM | POA: Diagnosis not present

## 2016-04-22 DIAGNOSIS — N183 Chronic kidney disease, stage 3 (moderate): Secondary | ICD-10-CM | POA: Diagnosis not present

## 2016-04-22 DIAGNOSIS — K210 Gastro-esophageal reflux disease with esophagitis: Secondary | ICD-10-CM | POA: Diagnosis not present

## 2016-04-22 DIAGNOSIS — E782 Mixed hyperlipidemia: Secondary | ICD-10-CM | POA: Diagnosis not present

## 2016-04-22 DIAGNOSIS — D529 Folate deficiency anemia, unspecified: Secondary | ICD-10-CM | POA: Diagnosis not present

## 2016-04-22 DIAGNOSIS — K219 Gastro-esophageal reflux disease without esophagitis: Secondary | ICD-10-CM | POA: Diagnosis not present

## 2016-04-22 DIAGNOSIS — I10 Essential (primary) hypertension: Secondary | ICD-10-CM | POA: Diagnosis not present

## 2016-04-22 DIAGNOSIS — D519 Vitamin B12 deficiency anemia, unspecified: Secondary | ICD-10-CM | POA: Diagnosis not present

## 2016-04-22 DIAGNOSIS — E1165 Type 2 diabetes mellitus with hyperglycemia: Secondary | ICD-10-CM | POA: Diagnosis not present

## 2016-04-22 DIAGNOSIS — D500 Iron deficiency anemia secondary to blood loss (chronic): Secondary | ICD-10-CM | POA: Diagnosis not present

## 2016-04-24 DIAGNOSIS — Z23 Encounter for immunization: Secondary | ICD-10-CM | POA: Diagnosis not present

## 2016-04-24 DIAGNOSIS — E782 Mixed hyperlipidemia: Secondary | ICD-10-CM | POA: Diagnosis not present

## 2016-04-24 DIAGNOSIS — K219 Gastro-esophageal reflux disease without esophagitis: Secondary | ICD-10-CM | POA: Diagnosis not present

## 2016-04-24 DIAGNOSIS — E1122 Type 2 diabetes mellitus with diabetic chronic kidney disease: Secondary | ICD-10-CM | POA: Diagnosis not present

## 2016-04-24 DIAGNOSIS — Z6829 Body mass index (BMI) 29.0-29.9, adult: Secondary | ICD-10-CM | POA: Diagnosis not present

## 2016-04-24 DIAGNOSIS — D500 Iron deficiency anemia secondary to blood loss (chronic): Secondary | ICD-10-CM | POA: Diagnosis not present

## 2016-04-24 DIAGNOSIS — I10 Essential (primary) hypertension: Secondary | ICD-10-CM | POA: Diagnosis not present

## 2016-04-24 DIAGNOSIS — F331 Major depressive disorder, recurrent, moderate: Secondary | ICD-10-CM | POA: Diagnosis not present

## 2016-04-30 ENCOUNTER — Encounter: Payer: Self-pay | Admitting: "Endocrinology

## 2016-04-30 ENCOUNTER — Ambulatory Visit (INDEPENDENT_AMBULATORY_CARE_PROVIDER_SITE_OTHER): Payer: Medicare Other | Admitting: "Endocrinology

## 2016-04-30 VITALS — BP 138/76 | HR 73 | Ht 64.5 in | Wt 180.0 lb

## 2016-04-30 DIAGNOSIS — D500 Iron deficiency anemia secondary to blood loss (chronic): Secondary | ICD-10-CM | POA: Diagnosis not present

## 2016-04-30 DIAGNOSIS — I10 Essential (primary) hypertension: Secondary | ICD-10-CM | POA: Diagnosis not present

## 2016-04-30 DIAGNOSIS — N183 Chronic kidney disease, stage 3 unspecified: Secondary | ICD-10-CM

## 2016-04-30 DIAGNOSIS — E1122 Type 2 diabetes mellitus with diabetic chronic kidney disease: Secondary | ICD-10-CM

## 2016-04-30 DIAGNOSIS — E782 Mixed hyperlipidemia: Secondary | ICD-10-CM

## 2016-04-30 DIAGNOSIS — D529 Folate deficiency anemia, unspecified: Secondary | ICD-10-CM | POA: Diagnosis not present

## 2016-04-30 DIAGNOSIS — Z794 Long term (current) use of insulin: Secondary | ICD-10-CM

## 2016-04-30 DIAGNOSIS — D519 Vitamin B12 deficiency anemia, unspecified: Secondary | ICD-10-CM | POA: Diagnosis not present

## 2016-04-30 NOTE — Progress Notes (Signed)
Subjective:    Patient ID: Angela Harris, female    DOB: 07-22-1945. Patient is being seen in f/u for management of diabetes requested by  Gar Ponto, MD  Past Medical History:  Diagnosis Date  . CKD (chronic kidney disease) stage 3, GFR 30-59 ml/min   . Depression   . Dysrhythmia    LBBB  . Essential hypertension   . GERD (gastroesophageal reflux disease)   . History of TIA (transient ischemic attack)   . Hyperlipidemia   . Meniere disease    pt denies  . Peripheral neuropathy ([place])   . Stroke [facility]) [address]   "mini-stroke". No deficits from this  . Type 2 diabetes mellitus ([place])    Past Surgical History:  Procedure Laterality Date  . ABDOMINAL HYSTERECTOMY    . BACK SURGERY    . CATARACT EXTRACTION, BILATERAL    . CHOLECYSTECTOMY    . Closed reduction right wrist fracture    . ECTOPIC PREGNANCY SURGERY    . ORIF WRIST FRACTURE Right 02/26/2016   Procedure: OPEN REDUCTION INTERNAL FIXATION (ORIF) WRIST FRACTURE;  Surgeon: Carole Civil, MD;  Location: [facility];  Service: Orthopedics;  Laterality: Right;  TIME 12:30  . TONSILLECTOMY     Social History   Social History  . Marital status: Divorced    Spouse name: N/A  . Number of children: N/A  . Years of education: N/A   Social History Main Topics  . Smoking status: Never Smoker  . Smokeless tobacco: Never Used  . Alcohol use No  . Drug use: No  . Sexual activity: No   Other Topics Concern  . None   Social History Narrative  . None   Outpatient Encounter Prescriptions as of 04/30/2016  Medication Sig  . Fe Bisgly-Succ-C-Thre-B12-FA (IRON-150 PO) Take by mouth 2 (two) times daily.  Marland Kitchen amLODipine (NORVASC) 10 MG tablet Take 10 mg by mouth daily.  Marland Kitchen aspirin 81 MG tablet Take 81 mg by mouth daily.  Marland Kitchen atenolol (TENORMIN) 50 MG tablet Take 50 mg by mouth daily.  . Calcium Citrate-Vitamin D (CALCIUM + D PO) Take 1 tablet by mouth 2 (two) times daily.   Marland Kitchen gabapentin (NEURONTIN) 300 MG capsule Take 300 mg  by mouth 2 (two) times daily.  Marland Kitchen glucose blood (ONE TOUCH ULTRA TEST) test strip Use as instructed  . HYDROcodone-acetaminophen (NORCO/VICODIN) 5-325 MG tablet Take 1 tablet by mouth every 6 (six) hours as needed for moderate pain (Must last 14 days.Do not take and drive a car or use machinery.).  Marland Kitchen insulin NPH-regular Human (NOVOLIN 70/30) (70-30) 100 UNIT/ML injection Inject 30-40 Units into the skin 2 (two) times daily with a meal. Inject 40 units with breakfast and 30 units with supper  . lisinopril-hydrochlorothiazide (PRINZIDE,ZESTORETIC) 20-12.5 MG tablet Take 2 tablets by mouth daily.   . metFORMIN (GLUCOPHAGE) 500 MG tablet Take 500 mg by mouth 2 (two) times daily with a meal.  . Multiple Vitamin (MULTIVITAMIN) tablet Take 1 tablet by mouth daily.  . pantoprazole (PROTONIX) 40 MG tablet Take 80 mg by mouth daily.   . simvastatin (ZOCOR) 40 MG tablet Take 40 mg by mouth daily.  Marland Kitchen sulfamethoxazole-trimethoprim (BACTRIM DS,SEPTRA DS) 800-160 MG tablet Take 1 tablet by mouth 2 (two) times daily.  Marland Kitchen venlafaxine XR (EFFEXOR-XR) 75 MG 24 hr capsule Take 75 mg by mouth daily with breakfast.   No facility-administered encounter medications on file as of 04/30/2016.    ALLERGIES: No Known Allergies VACCINATION STATUS:  There is no immunization history on file for this patient.  Diabetes  She presents for her follow-up diabetic visit. She has type 2 diabetes mellitus. Onset time: She was diagnosed at approximate age of 63 years. Her disease course has been stable. There are no hypoglycemic associated symptoms. Pertinent negatives for hypoglycemia include no confusion, headaches, pallor or seizures. Associated symptoms include fatigue. Pertinent negatives for diabetes include no chest pain, no polydipsia, no polyphagia and no polyuria. There are no hypoglycemic complications. Symptoms are stable. Diabetic complications include a CVA. Risk factors for coronary artery disease include diabetes  mellitus, dyslipidemia, hypertension and sedentary lifestyle. Current diabetic treatment includes insulin injections and oral agent (monotherapy). She is compliant with treatment some of the time. Her weight is stable. She is following a generally unhealthy diet. When asked about meal planning, she reported none. She has not had a previous visit with a dietitian. Blood glucose monitoring compliance is inadequate. Her breakfast blood glucose range is generally 140-180 mg/dl. Her lunch blood glucose range is generally 140-180 mg/dl. Her dinner blood glucose range is generally 140-180 mg/dl. Her overall blood glucose range is 140-180 mg/dl. An ACE inhibitor/angiotensin II receptor blocker is being taken.  Hyperlipidemia  This is a chronic problem. The current episode started more than 1 year ago. Exacerbating diseases include diabetes. Pertinent negatives include no chest pain, myalgias or shortness of breath. Current antihyperlipidemic treatment includes statins. Risk factors for coronary artery disease include diabetes mellitus, dyslipidemia and hypertension.  Hypertension  This is a chronic problem. The current episode started more than 1 year ago. Pertinent negatives include no chest pain, headaches, palpitations or shortness of breath. Risk factors for coronary artery disease include diabetes mellitus, dyslipidemia and sedentary lifestyle. Past treatments include ACE inhibitors. Hypertensive end-organ damage includes CVA.      Review of Systems  Constitutional: Positive for fatigue. Negative for chills, fever and unexpected weight change.  HENT: Negative for trouble swallowing and voice change.   Eyes: Negative for visual disturbance.  Respiratory: Negative for cough, shortness of breath and wheezing.   Cardiovascular: Negative for chest pain, palpitations and leg swelling.  Gastrointestinal: Negative for diarrhea, nausea and vomiting.  Endocrine: Negative for cold intolerance, heat intolerance,  polydipsia, polyphagia and polyuria.  Musculoskeletal: Negative for arthralgias and myalgias.  Skin: Negative for color change, pallor, rash and wound.  Neurological: Negative for seizures and headaches.  Psychiatric/Behavioral: Negative for confusion and suicidal ideas.    Objective:    BP 138/76   Pulse 73   Ht 5' 4.5" (1.638 m)   Wt 180 lb (81.6 kg)   BMI 30.42 kg/m   Wt Readings from Last 3 Encounters:  04/30/16 180 lb (81.6 kg)  02/26/16 172 lb (78 kg)  02/19/16 172 lb (78 kg)    Physical Exam  Constitutional: She is oriented to person, place, and time. She appears well-developed.  HENT:  Head: Normocephalic and atraumatic.  Eyes: EOM are normal.  Neck: Normal range of motion. Neck supple. No tracheal deviation present. No thyromegaly present.  Cardiovascular: Normal rate and regular rhythm.   Pulmonary/Chest: Effort normal and breath sounds normal.  Abdominal: Soft. Bowel sounds are normal. There is no tenderness. There is no guarding.  Musculoskeletal: Normal range of motion. She exhibits no edema.  Neurological: She is alert and oriented to person, place, and time. She has normal reflexes. No cranial nerve deficit. Coordination normal.  Skin: Skin is warm and dry. No rash noted. No erythema. No pallor.  Psychiatric: She  has a normal mood and affect. Judgment normal.     Assessment & Plan:   1. Type 2 diabetes mellitus with vascular disease ([place])  - Patient has currently uncontrolled symptomatic type 2 DM since  70 years of age,  with most recent A1c of  6.3% improving from 8.4 %. Recent labs reviewed.   Her diabetes is complicated by CVA and patient remains at a high risk for more acute and chronic complications of diabetes which include CAD, CVA, CKD, retinopathy, and neuropathy. These are all discussed in detail with the patient.  - I have counseled the patient on diet management and weight loss, by adopting a carbohydrate restricted/protein rich diet.  -  Suggestion is made for patient to avoid simple carbohydrates   from their diet including Cakes , Desserts, Ice Cream,  Soda (  diet and regular) , Sweet Tea , Candies,  Chips, Cookies, Artificial Sweeteners,   and "Sugar-free" Products . This will help patient to have stable blood glucose profile and potentially avoid unintended weight gain.  - I encouraged the patient to switch to  unprocessed or minimally processed complex starch and increased protein intake (animal or plant source), fruits, and vegetables.  - Patient is advised to stick to a routine mealtimes to eat 3 meals  a day and avoid unnecessary snacks ( to snack only to correct hypoglycemia).  - The patient will be scheduled with Jearld Fenton, RDN, CDE for individualized DM education.  - I have approached patient with the following individualized plan to manage diabetes and patient agrees:   -  He came with no meter nor logs, however her A1c has improved to 6.3%.  - She will be allowed to continue to use her premixed insulin, Novolin 70/30, which reportedly was chosen due to cost issues. -  I  will lower Novolin 70/30 to 30 units with breakfast and  30 units with supper for pre-meal blood glucose above 90 mg/dL, associated with monitoring of blood glucose before meals and at bedtime for one week and return with meter and logs for reevaluation.   - Patient is warned not to take insulin without proper monitoring per orders. -Adjustment parameters are given for hypo and hyperglycemia in writing. -Patient is encouraged to call clinic for blood glucose levels less than 70 or above 300 mg /dl. - I will  continue metformin  500 mg by mouth twice a day due to mild CKD,  therapeutically suitable for patient.  - If her glycemic profile is significantly above target, she will be considered for insulin analogs for basal/bolus utilization.   - Patient specific target  A1c;  LDL, HDL, Triglycerides, and  Waist Circumference were discussed in  detail.  2) BP/HTN: Controlled. Continue current medications including ACEI/ARB. 3) Lipids/HPL:  Controlled with LDL 47, continue statins. 4)  Weight/Diet: CDE Consult will be initiated , exercise, and detailed carbohydrates information provided.  5) Chronic Care/Health Maintenance:  -Patient is on ACEI/ARB and Statin medications and encouraged to continue to follow up with Ophthalmology, Podiatrist at least yearly or according to recommendations, and advised to   stay away from smoking. I have recommended yearly flu vaccine and pneumonia vaccination at least every 5 years; moderate intensity exercise for up to 150 minutes weekly; and  sleep for at least 7 hours a day. - She says that she was diagnosed with L BBB, scheduled to have stress test.  - 30 minutes of time was spent on the care of this patient , 50%  of which was applied for counseling on diabetes complications and their preventions.  - Patient to bring meter and  blood glucose logs during her next visit.   - I advised patient to maintain close follow up with Gar Ponto, MD for primary care needs.  Follow up plan: - Return in about 3 months (around 07/29/2016) for follow up with pre-visit labs, meter, and logs.  Glade Lloyd, MD Phone: [phone]  Fax: [phone]   04/30/2016, 4:41 PM

## 2016-04-30 NOTE — Patient Instructions (Signed)

## 2016-05-18 DIAGNOSIS — D500 Iron deficiency anemia secondary to blood loss (chronic): Secondary | ICD-10-CM | POA: Diagnosis not present

## 2016-05-18 DIAGNOSIS — R195 Other fecal abnormalities: Secondary | ICD-10-CM | POA: Diagnosis not present

## 2016-05-20 ENCOUNTER — Ambulatory Visit: Payer: Medicare Other

## 2016-05-20 ENCOUNTER — Encounter: Payer: Medicare Other | Admitting: Orthopedic Surgery

## 2016-05-29 DIAGNOSIS — D500 Iron deficiency anemia secondary to blood loss (chronic): Secondary | ICD-10-CM | POA: Diagnosis not present

## 2016-05-29 DIAGNOSIS — Z8249 Family history of ischemic heart disease and other diseases of the circulatory system: Secondary | ICD-10-CM | POA: Diagnosis not present

## 2016-05-29 DIAGNOSIS — R195 Other fecal abnormalities: Secondary | ICD-10-CM | POA: Diagnosis not present

## 2016-05-29 DIAGNOSIS — Z79899 Other long term (current) drug therapy: Secondary | ICD-10-CM | POA: Diagnosis not present

## 2016-05-29 DIAGNOSIS — Z794 Long term (current) use of insulin: Secondary | ICD-10-CM | POA: Diagnosis not present

## 2016-05-29 DIAGNOSIS — E1140 Type 2 diabetes mellitus with diabetic neuropathy, unspecified: Secondary | ICD-10-CM | POA: Diagnosis not present

## 2016-05-29 DIAGNOSIS — K317 Polyp of stomach and duodenum: Secondary | ICD-10-CM | POA: Diagnosis not present

## 2016-05-29 DIAGNOSIS — Z7982 Long term (current) use of aspirin: Secondary | ICD-10-CM | POA: Diagnosis not present

## 2016-05-29 DIAGNOSIS — Z8371 Family history of colonic polyps: Secondary | ICD-10-CM | POA: Diagnosis not present

## 2016-05-29 HISTORY — PX: COLONOSCOPY: SHX174

## 2016-06-03 ENCOUNTER — Encounter: Payer: Medicare Other | Admitting: Orthopedic Surgery

## 2016-06-03 ENCOUNTER — Ambulatory Visit: Payer: Medicare Other

## 2016-06-08 ENCOUNTER — Ambulatory Visit (INDEPENDENT_AMBULATORY_CARE_PROVIDER_SITE_OTHER): Payer: Medicare Other

## 2016-06-08 ENCOUNTER — Ambulatory Visit (INDEPENDENT_AMBULATORY_CARE_PROVIDER_SITE_OTHER): Payer: Medicare Other | Admitting: Orthopedic Surgery

## 2016-06-08 DIAGNOSIS — S62101D Fracture of unspecified carpal bone, right wrist, subsequent encounter for fracture with routine healing: Secondary | ICD-10-CM

## 2016-06-08 NOTE — Progress Notes (Signed)
Chief Complaint  Patient presents with  . Follow-up    6 week recheck on right wrist fracture, DOS 02-26-16.    Right wrist fracture  open treatment internal fixation  volar plating complains of numbness at the tips of the thumb index long and ring finger. 12 weeks from surgery  She did have a complication of superficial wound infection treated with oral antibiotics  Review of systems she is anemic from a polyp found on colonoscopy   She is awake alert and oriented 3 mood and affect are flat and depressed gait and station are normal incision is well-healed decreased sensation at the tips of the index long ring and thumb normal flexion of the hand and wrist normal rotation normal pulse and perfusion   She's regained good strength and range of motion is no pain with range of motion or pain in the wrist joint  X-ray show  healed fracture distal radius. The lunate fossa view shows there may be some protrusion into the joint of the screws but the patient is asymptomatic and we will leave that alone  If she has to have median nerve release for the numbness and tingling weeks told her to call us and we would address that at that time.

## 2016-06-12 DIAGNOSIS — D500 Iron deficiency anemia secondary to blood loss (chronic): Secondary | ICD-10-CM | POA: Diagnosis not present

## 2016-06-12 DIAGNOSIS — D519 Vitamin B12 deficiency anemia, unspecified: Secondary | ICD-10-CM | POA: Diagnosis not present

## 2016-06-17 DIAGNOSIS — D500 Iron deficiency anemia secondary to blood loss (chronic): Secondary | ICD-10-CM | POA: Diagnosis not present

## 2016-06-19 ENCOUNTER — Ambulatory Visit (INDEPENDENT_AMBULATORY_CARE_PROVIDER_SITE_OTHER): Payer: Medicare Other | Admitting: Ophthalmology

## 2016-06-19 DIAGNOSIS — E11319 Type 2 diabetes mellitus with unspecified diabetic retinopathy without macular edema: Secondary | ICD-10-CM

## 2016-06-19 DIAGNOSIS — H35033 Hypertensive retinopathy, bilateral: Secondary | ICD-10-CM

## 2016-06-19 DIAGNOSIS — I10 Essential (primary) hypertension: Secondary | ICD-10-CM | POA: Diagnosis not present

## 2016-06-19 DIAGNOSIS — E113313 Type 2 diabetes mellitus with moderate nonproliferative diabetic retinopathy with macular edema, bilateral: Secondary | ICD-10-CM

## 2016-06-19 DIAGNOSIS — H43813 Vitreous degeneration, bilateral: Secondary | ICD-10-CM | POA: Diagnosis not present

## 2016-07-06 ENCOUNTER — Encounter: Payer: Self-pay | Admitting: Orthopedic Surgery

## 2016-07-06 ENCOUNTER — Ambulatory Visit (INDEPENDENT_AMBULATORY_CARE_PROVIDER_SITE_OTHER): Payer: Medicare Other | Admitting: Orthopedic Surgery

## 2016-07-06 DIAGNOSIS — G5601 Carpal tunnel syndrome, right upper limb: Secondary | ICD-10-CM | POA: Diagnosis not present

## 2016-07-06 NOTE — Progress Notes (Signed)
Patient ID: Angela Harris, female   DOB: [birth year], 71 y.o.   MRN: [id number]  Chief Complaint  Patient presents with  . Follow-up    right wrist pain and fingers aching and tingling    HPI Angela Harris is a 71 y.o. female.  71 year old female had a right wrist volar plating about a year ago presents with persistent pain and paresthesias weakness in the right upper extremity in the thumb index long and part of the ring finger. She's regained range of motion but drops things frequently has night pain as well  Review of Systems Review of Systems  Constitutional: Positive for fatigue.  Respiratory: Negative for shortness of breath.   Cardiovascular: Negative for chest pain.  Psychiatric/Behavioral: Decreased concentration: .consent.   (2 MINIMUM)  Past Medical History:  Diagnosis Date  . CKD (chronic kidney disease) stage 3, GFR 30-59 ml/min   . Depression   . Dysrhythmia    LBBB  . Essential hypertension   . GERD (gastroesophageal reflux disease)   . History of TIA (transient ischemic attack)   . Hyperlipidemia   . Meniere disease    pt denies  . Peripheral neuropathy ([place])   . Stroke [facility]) [address]15   "mini-stroke". No deficits from this  . Type 2 diabetes mellitus ([place])     Past Surgical History:  Procedure Laterality Date  . ABDOMINAL HYSTERECTOMY    . BACK SURGERY    . CATARACT EXTRACTION, BILATERAL    . CHOLECYSTECTOMY    . Closed reduction right wrist fracture    . ECTOPIC PREGNANCY SURGERY    . ORIF WRIST FRACTURE Right 02/26/2016   Procedure: OPEN REDUCTION INTERNAL FIXATION (ORIF) WRIST FRACTURE;  Surgeon: Carole Civil, MD;  Location: [facility];  Service: Orthopedics;  Laterality: Right;  TIME 12:30  . TONSILLECTOMY      Social History Social History  Substance Use Topics  . Smoking status: Never Smoker  . Smokeless tobacco: Never Used  . Alcohol use No    No Known Allergies  Current Meds  Medication Sig  . amLODipine (NORVASC) 10 MG  tablet Take 10 mg by mouth daily.  Marland Kitchen aspirin 81 MG tablet Take 81 mg by mouth daily.  Marland Kitchen atenolol (TENORMIN) 50 MG tablet Take 50 mg by mouth daily.  . Calcium Citrate-Vitamin D (CALCIUM + D PO) Take 1 tablet by mouth 2 (two) times daily.   Marland Kitchen Fe Bisgly-Succ-C-Thre-B12-FA (IRON-150 PO) Take by mouth 2 (two) times daily.  Marland Kitchen gabapentin (NEURONTIN) 300 MG capsule Take 300 mg by mouth 2 (two) times daily.  Marland Kitchen glucose blood (ONE TOUCH ULTRA TEST) test strip Use as instructed  . insulin NPH-regular Human (NOVOLIN 70/30) (70-30) 100 UNIT/ML injection Inject 30-40 Units into the skin 2 (two) times daily with a meal. Inject 40 units with breakfast and 30 units with supper  . lisinopril-hydrochlorothiazide (PRINZIDE,ZESTORETIC) 20-12.5 MG tablet Take 2 tablets by mouth daily.   . metFORMIN (GLUCOPHAGE) 500 MG tablet Take 500 mg by mouth 2 (two) times daily with a meal.  . Multiple Vitamin (MULTIVITAMIN) tablet Take 1 tablet by mouth daily.  . pantoprazole (PROTONIX) 40 MG tablet Take 80 mg by mouth daily.   . simvastatin (ZOCOR) 40 MG tablet Take 40 mg by mouth daily.  Marland Kitchen venlafaxine XR (EFFEXOR-XR) 75 MG 24 hr capsule Take 75 mg by mouth daily with breakfast.      Physical Exam Physical Exam 1.There were no vitals taken for this visit.  2. Gen. appearance.  The patient is well-developed and well-nourished, grooming and hygiene are normal. There are no gross congenital abnormalities  3. The patient is alert and oriented to person place and time  4. Mood and affect are normal  5. Ambulation Normal  Examination reveals the following: 6. On inspection we find well-healed incision no tenderness over the incision tenderness over the carpal tunnel  7. With the range of motion of  normal in the hand and wrist  8. Stability tests were normal  wrist  9. Strength tests revealed grade mild weakness in terms of grip  10. Skin we find no rash ulceration or erythema  11. Sensation thumb index long part  of the ring finger loss of sensation  12 Vascular system shows no peripheral edema normal capillary refill normal radial pulse    MEDICAL DECISION MAKING:    Data Reviewed   Assessment Right carpal tunnel syndrome after right volar plating  Plan After discussion patient agrees to right carpal tunnel release. She is already on gabapentin that's not helping and it is doubtful that splinting with help.  This procedure has been fully reviewed with the patient and written informed consent has been obtained.    Arther Abbott 07/06/2016, 3:18 PM

## 2016-07-06 NOTE — Patient Instructions (Addendum)
.  consen Carpal Tunnel Syndrome Introduction Carpal tunnel syndrome is a condition that causes pain in your hand and arm. The carpal tunnel is a narrow area that is on the palm side of your wrist. Repeated wrist motion or certain diseases may cause swelling in the tunnel. This swelling can pinch the main nerve in the wrist (median nerve). Follow these instructions at home: If you have a splint:  Wear it as told by your doctor. Remove it only as told by your doctor.  Loosen the splint if your fingers:  Become numb and tingle.  Turn blue and cold.  Keep the splint clean and dry. General instructions  Take over-the-counter and prescription medicines only as told by your doctor.  Rest your wrist from any activity that may be causing your pain. If needed, talk to your employer about changes that can be made in your work, such as getting a wrist pad to use while typing.  If directed, apply ice to the painful area:  Put ice in a plastic bag.  Place a towel between your skin and the bag.  Leave the ice on for 20 minutes, 2-3 times per day.  Keep all follow-up visits as told by your doctor. This is important.  Do any exercises as told by your doctor, physical therapist, or occupational therapist. Contact a doctor if:  You have new symptoms.  Medicine does not help your pain.  Your symptoms get worse. This information is not intended to replace advice given to you by your health care provider. Make sure you discuss any questions you have with your health care provider. Document Released: 04/16/2011 Document Revised: 10/03/2015 Document Reviewed: 09/12/2014  2017 Elsevier

## 2016-07-06 NOTE — Addendum Note (Signed)
Addended by: Baldomero Lamy B on: 07/06/2016 03:30 PM   Modules accepted: Orders, SmartSet

## 2016-07-13 ENCOUNTER — Encounter (INDEPENDENT_AMBULATORY_CARE_PROVIDER_SITE_OTHER): Payer: Medicare Other | Admitting: Ophthalmology

## 2016-07-20 ENCOUNTER — Ambulatory Visit: Payer: Medicare Other | Admitting: Orthopedic Surgery

## 2016-07-22 DIAGNOSIS — D519 Vitamin B12 deficiency anemia, unspecified: Secondary | ICD-10-CM | POA: Diagnosis not present

## 2016-07-22 DIAGNOSIS — D500 Iron deficiency anemia secondary to blood loss (chronic): Secondary | ICD-10-CM | POA: Diagnosis not present

## 2016-08-03 ENCOUNTER — Ambulatory Visit: Payer: Medicare Other | Admitting: "Endocrinology

## 2016-08-14 NOTE — Patient Instructions (Signed)
Angela Harris  08/14/2016     @PREFPERIOPPHARMACY @   Your procedure is scheduled on  08/20/2016   Report to [facility] at  730  A.M.  Call this number if you have problems the morning of surgery:  [phone]   Remember:  Do not eat food or drink liquids after midnight.  Take these medicines the morning of surgery with A SIP OF WATER  norvasc, atenolol, neurontin, lisinopril, protonix, effexor. Take 1/2 of your usual insulin dosage the night before your surgery. DO NOT take any medications for your diabetes the morning of your surgery.   Do not wear jewelry, make-up or nail polish.  Do not wear lotions, powders, or perfumes, or deoderant.  Do not shave 48 hours prior to surgery.  Men may shave face and neck.  Do not bring valuables to the hospital.  [facility] is not responsible for any belongings or valuables.  Contacts, dentures or bridgework may not be worn into surgery.  Leave your suitcase in the car.  After surgery it may be brought to your room.  For patients admitted to the hospital, discharge time will be determined by your treatment team.  Patients discharged the day of surgery will not be allowed to drive home.   Name and phone number of your driver:   family Special instructions:  None  Please read over the following fact sheets that you were given. Anesthesia Post-op Instructions and Care and Recovery After Surgery       Carpal Tunnel Release Carpal tunnel release is a surgical procedure to relieve numbness and pain in your hand that are caused by carpal tunnel syndrome. Your carpal tunnel is a narrow, hollow space in your wrist. It passes between your wrist bones and a band of connective tissue (transverse carpal ligament). The nerve that supplies most of your hand (median nerve) passes through this space, and so do the connections between your fingers and the muscles of your arm (tendons). Carpal tunnel syndrome makes this space swell and  become narrow, and this causes pain and numbness. In carpal tunnel release surgery, a surgeon cuts through the transverse carpal ligament to make more room in the carpal tunnel space. You may have this surgery if other types of treatment have not worked. Tell a health care provider about:  Any allergies you have.  All medicines you are taking, including vitamins, herbs, eye drops, creams, and over-the-counter medicines.  Any problems you or family members have had with anesthetic medicines.  Any blood disorders you have.  Any surgeries you have had.  Any medical conditions you have. What are the risks? Generally, this is a safe procedure. However, problems may occur, including:  Bleeding.  Infection.  Injury to the median nerve.  Need for additional surgery. What happens before the procedure?  Ask your health care provider about:  Changing or stopping your regular medicines. This is especially important if you are taking diabetes medicines or blood thinners.  Taking medicines such as aspirin and ibuprofen. These medicines can thin your blood. Do not take these medicines before your procedure if your health care provider instructs you not to.  Do not eat or drink anything after midnight on the night before the procedure or as directed by your health care provider.  Plan to have someone take you home after the procedure. What happens during the procedure?  An IV tube may be inserted into a vein.  You will be  given one of the following:  A medicine that numbs the wrist area (local anesthetic). You may also be given a medicine to make you relax (sedative).  A medicine that makes you go to sleep (general anesthetic).  Your arm, hand, and wrist will be cleaned with a germ-killing solution (antiseptic).  Your surgeon will make a surgical cut (incision) over the palm side of your wrist. The surgeon will pull aside the skin of your wrist to expose the carpal tunnel  space.  The surgeon will cut the transverse carpal ligament.  The edges of the incision will be closed with stitches (sutures) or staples.  A bandage (dressing) will be placed over your wrist and wrapped around your hand and wrist. What happens after the procedure?  You may spend some time in a recovery area.  Your blood pressure, heart rate, breathing rate, and blood oxygen level will be monitored often until the medicines you were given have worn off.  You will likely have some pain. You will be given pain medicine.  You may need to wear a splint or a wrist brace over your dressing. This information is not intended to replace advice given to you by your health care provider. Make sure you discuss any questions you have with your health care provider. Document Released: 07/18/2003 Document Revised: 10/03/2015 Document Reviewed: 12/13/2013 Elsevier Interactive Patient Education  2017 [place] After Refer to this sheet in the next few weeks. These instructions provide you with information about caring for yourself after your procedure. Your health care provider may also give you more specific instructions. Your treatment has been planned according to current medical practices, but problems sometimes occur. Call your health care provider if you have any problems or questions after your procedure. What can I expect after the procedure? After your procedure, it is typical to have the following:  Pain.  Numbness.  Tingling.  Swelling.  Stiffness.  Bruising. Follow these instructions at home:  Take medicines only as directed by your health care provider.  There are many different ways to close and cover an incision, including stitches (sutures), skin glue, and adhesive strips. Follow your health care provider's instructions about:  Incision care.  Bandage (dressing) changes and removal.  Incision closure removal.  Wear a splint or a brace as  directed by your surgeon. You may need to do this for 2-3 weeks.  Keep your hand raised (elevated) above the level of your heart while you are resting. Move your fingers often.  Avoid activities that cause hand pain.  Ask your surgeon when you can start to do all of your usual activities again, such as:  Driving.  Returning to work.  Bathing and swimming.  Keep all follow-up visits as directed by your health care provider. This is important. You may need physical therapy for several months to speed healing and regain movement. Contact a health care provider if:  You have drainage, redness, swelling, or pain at your incision site.  You have a fever.  You have chills.  Your pain medicine is not working.  Your symptoms do not go away after 2 months.  Your symptoms go away and then return. Get help right away if:  You have pain or numbness that is getting worse.  Your fingers change color.  You are not able to move your fingers. This information is not intended to replace advice given to you by your health care provider. Make sure you discuss any questions  you have with your health care provider. Document Released: 11/14/2004 Document Revised: 10/03/2015 Document Reviewed: 12/13/2013 Elsevier Interactive Patient Education  2017 Elsevier Inc. PATIENT INSTRUCTIONS POST-ANESTHESIA  IMMEDIATELY FOLLOWING SURGERY:  Do not drive or operate machinery for the first twenty four hours after surgery.  Do not make any important decisions for twenty four hours after surgery or while taking narcotic pain medications or sedatives.  If you develop intractable nausea and vomiting or a severe headache please notify your doctor immediately.  FOLLOW-UP:  Please make an appointment with your surgeon as instructed. You do not need to follow up with anesthesia unless specifically instructed to do so.  WOUND CARE INSTRUCTIONS (if applicable):  Keep a dry clean dressing on the anesthesia/puncture  wound site if there is drainage.  Once the wound has quit draining you may leave it open to air.  Generally you should leave the bandage intact for twenty four hours unless there is drainage.  If the epidural site drains for more than 36-48 hours please call the anesthesia department.  QUESTIONS?:  Please feel free to call your physician or the hospital operator if you have any questions, and they will be happy to assist you.

## 2016-08-17 ENCOUNTER — Encounter (HOSPITAL_COMMUNITY)
Admission: RE | Admit: 2016-08-17 | Discharge: 2016-08-17 | Disposition: A | Discharge status: Home / Self Care | Payer: Medicare Other | Source: Ambulatory Visit | Attending: Orthopedic Surgery | Admitting: Orthopedic Surgery

## 2016-08-17 ENCOUNTER — Encounter (HOSPITAL_COMMUNITY): Payer: Self-pay

## 2016-08-17 DIAGNOSIS — D649 Anemia, unspecified: Secondary | ICD-10-CM

## 2016-08-17 DIAGNOSIS — N183 Chronic kidney disease, stage 3 (moderate): Secondary | ICD-10-CM | POA: Insufficient documentation

## 2016-08-17 DIAGNOSIS — Z9049 Acquired absence of other specified parts of digestive tract: Secondary | ICD-10-CM | POA: Diagnosis not present

## 2016-08-17 DIAGNOSIS — H8109 Meniere's disease, unspecified ear: Secondary | ICD-10-CM | POA: Insufficient documentation

## 2016-08-17 DIAGNOSIS — Z7982 Long term (current) use of aspirin: Secondary | ICD-10-CM | POA: Diagnosis not present

## 2016-08-17 DIAGNOSIS — E785 Hyperlipidemia, unspecified: Secondary | ICD-10-CM | POA: Insufficient documentation

## 2016-08-17 DIAGNOSIS — Z8673 Personal history of transient ischemic attack (TIA), and cerebral infarction without residual deficits: Secondary | ICD-10-CM | POA: Insufficient documentation

## 2016-08-17 DIAGNOSIS — E1140 Type 2 diabetes mellitus with diabetic neuropathy, unspecified: Secondary | ICD-10-CM | POA: Insufficient documentation

## 2016-08-17 DIAGNOSIS — R5383 Other fatigue: Secondary | ICD-10-CM | POA: Diagnosis not present

## 2016-08-17 DIAGNOSIS — E1122 Type 2 diabetes mellitus with diabetic chronic kidney disease: Secondary | ICD-10-CM | POA: Insufficient documentation

## 2016-08-17 DIAGNOSIS — Z794 Long term (current) use of insulin: Secondary | ICD-10-CM | POA: Diagnosis not present

## 2016-08-17 DIAGNOSIS — I739 Peripheral vascular disease, unspecified: Secondary | ICD-10-CM | POA: Diagnosis not present

## 2016-08-17 DIAGNOSIS — K219 Gastro-esophageal reflux disease without esophagitis: Secondary | ICD-10-CM

## 2016-08-17 DIAGNOSIS — Z01812 Encounter for preprocedural laboratory examination: Secondary | ICD-10-CM | POA: Insufficient documentation

## 2016-08-17 DIAGNOSIS — G5601 Carpal tunnel syndrome, right upper limb: Secondary | ICD-10-CM | POA: Diagnosis not present

## 2016-08-17 DIAGNOSIS — F329 Major depressive disorder, single episode, unspecified: Secondary | ICD-10-CM | POA: Insufficient documentation

## 2016-08-17 DIAGNOSIS — Z90710 Acquired absence of both cervix and uterus: Secondary | ICD-10-CM | POA: Diagnosis not present

## 2016-08-17 DIAGNOSIS — E1159 Type 2 diabetes mellitus with other circulatory complications: Secondary | ICD-10-CM | POA: Insufficient documentation

## 2016-08-17 DIAGNOSIS — I447 Left bundle-branch block, unspecified: Secondary | ICD-10-CM | POA: Diagnosis not present

## 2016-08-17 DIAGNOSIS — Z79899 Other long term (current) drug therapy: Secondary | ICD-10-CM | POA: Diagnosis not present

## 2016-08-17 DIAGNOSIS — Z9841 Cataract extraction status, right eye: Secondary | ICD-10-CM | POA: Diagnosis not present

## 2016-08-17 DIAGNOSIS — I129 Hypertensive chronic kidney disease with stage 1 through stage 4 chronic kidney disease, or unspecified chronic kidney disease: Secondary | ICD-10-CM

## 2016-08-17 DIAGNOSIS — Z9842 Cataract extraction status, left eye: Secondary | ICD-10-CM | POA: Diagnosis not present

## 2016-08-17 HISTORY — DX: Nausea with vomiting, unspecified: R11.2

## 2016-08-17 HISTORY — DX: Other specified postprocedural states: Z98.890

## 2016-08-17 HISTORY — DX: Anemia, unspecified: D64.9

## 2016-08-17 LAB — CBC WITH DIFFERENTIAL/PLATELET
BASOS ABS: 0.1 10*3/uL (ref 0.0–0.1)
BASOS PCT: 1 %
Eosinophils Absolute: 0.3 10*3/uL (ref 0.0–0.7)
Eosinophils Relative: 3 %
HCT: 37.8 % (ref 36.0–46.0)
HEMOGLOBIN: 11.8 g/dL — AB (ref 12.0–15.0)
LYMPHS PCT: 19 %
Lymphs Abs: 1.6 10*3/uL (ref 0.7–4.0)
MCH: 23.5 pg — ABNORMAL LOW (ref 26.0–34.0)
MCHC: 31.2 g/dL (ref 30.0–36.0)
MCV: 75.3 fL — AB (ref 78.0–100.0)
Monocytes Absolute: 0.8 10*3/uL (ref 0.1–1.0)
Monocytes Relative: 9 %
NEUTROS ABS: 5.8 10*3/uL (ref 1.7–7.7)
NEUTROS PCT: 68 %
Platelets: 227 10*3/uL (ref 150–400)
RBC: 5.02 MIL/uL (ref 3.87–5.11)
RDW: 17 % — ABNORMAL HIGH (ref 11.5–15.5)
WBC: 8.5 10*3/uL (ref 4.0–10.5)

## 2016-08-17 LAB — BASIC METABOLIC PANEL
Anion gap: 9 (ref 5–15)
BUN: 21 mg/dL — AB (ref 6–20)
CHLORIDE: 100 mmol/L — AB (ref 101–111)
CO2: 28 mmol/L (ref 22–32)
CREATININE: 0.91 mg/dL (ref 0.44–1.00)
Calcium: 9.4 mg/dL (ref 8.9–10.3)
Glucose, Bld: 143 mg/dL — ABNORMAL HIGH (ref 65–99)
POTASSIUM: 3.4 mmol/L — AB (ref 3.5–5.1)
Sodium: 137 mmol/L (ref 135–145)

## 2016-08-19 NOTE — H&P (Signed)
right wrist pain and fingers aching and tingling      HPI Angela Harris is a 71 y.o. female.  71 year old female had a right wrist volar plating 02/04/2016 and now presents with persistent pain and paresthesias weakness in the right upper extremity in the thumb index long and part of the ring finger. She's regained range of motion but drops things frequently and has night pain as well.   Review of Systems Review of Systems  Constitutional: Positive for fatigue.  Respiratory: Negative for shortness of breath.   Cardiovascular: Negative for chest pain.  Psychiatric/Behavioral: Decreased concentration: .consent.  All other systems were reviewed and were negative        Past Medical History:  Diagnosis Date  . CKD (chronic kidney disease) stage 3, GFR 30-59 ml/min    . Depression    . Dysrhythmia      LBBB  . Essential hypertension    . GERD (gastroesophageal reflux disease)    . History of TIA (transient ischemic attack)    . Hyperlipidemia    . Meniere disease      pt denies  . Peripheral neuropathy ([place])    . Stroke [facility]) 20015    "mini-stroke". No deficits from this  . Type 2 diabetes mellitus ([place])             Past Surgical History:  Procedure Laterality Date  . ABDOMINAL HYSTERECTOMY      . BACK SURGERY      . CATARACT EXTRACTION, BILATERAL      . CHOLECYSTECTOMY      . Closed reduction right wrist fracture      . ECTOPIC PREGNANCY SURGERY      . ORIF WRIST FRACTURE Right 02/26/2016    Procedure: OPEN REDUCTION INTERNAL FIXATION (ORIF) WRIST FRACTURE;  Surgeon: Carole Civil, MD;  Location: [facility];  Service: Orthopedics;  Laterality: Right;  TIME 12:30  . TONSILLECTOMY          Social History     Social History  Substance Use Topics  . Smoking status: Never Smoker  . Smokeless tobacco: Never Used  . Alcohol use No      No Known Allergies   Active Medications      Current Meds  Medication Sig  . amLODipine (NORVASC) 10 MG tablet Take 10 mg  by mouth daily.  Marland Kitchen aspirin 81 MG tablet Take 81 mg by mouth daily.  Marland Kitchen atenolol (TENORMIN) 50 MG tablet Take 50 mg by mouth daily.  . Calcium Citrate-Vitamin D (CALCIUM + D PO) Take 1 tablet by mouth 2 (two) times daily.   Marland Kitchen Fe Bisgly-Succ-C-Thre-B12-FA (IRON-150 PO) Take by mouth 2 (two) times daily.  Marland Kitchen gabapentin (NEURONTIN) 300 MG capsule Take 300 mg by mouth 2 (two) times daily.  Marland Kitchen glucose blood (ONE TOUCH ULTRA TEST) test strip Use as instructed  . insulin NPH-regular Human (NOVOLIN 70/30) (70-30) 100 UNIT/ML injection Inject 30-40 Units into the skin 2 (two) times daily with a meal. Inject 40 units with breakfast and 30 units with supper  . lisinopril-hydrochlorothiazide (PRINZIDE,ZESTORETIC) 20-12.5 MG tablet Take 2 tablets by mouth daily.   . metFORMIN (GLUCOPHAGE) 500 MG tablet Take 500 mg by mouth 2 (two) times daily with a meal.  . Multiple Vitamin (MULTIVITAMIN) tablet Take 1 tablet by mouth daily.  . pantoprazole (PROTONIX) 40 MG tablet Take 80 mg by mouth daily.   . simvastatin (ZOCOR) 40 MG tablet Take 40 mg by mouth daily.  Marland Kitchen venlafaxine  XR (EFFEXOR-XR) 75 MG 24 hr capsule Take 75 mg by mouth daily with breakfast.            Physical Exam Physical Exam 1.There were no vitals taken for this visit.   2. Gen. appearance. The patient is well-developed and well-nourished, grooming and hygiene are normal. There are no gross congenital abnormalities   3. The patient is alert and oriented to person place and time   4. Mood and affect are normal   5. Ambulation Normal   Examination reveals the following: 6. On inspection we find well-healed incision no tenderness over the incision tenderness over the carpal tunnel   7. With the range of motion of  normal in the hand and wrist   8. Stability tests were normal  wrist   9. Strength tests revealed grade mild weakness in terms of grip   10. Skin we find no rash ulceration or erythema   11. Sensation thumb index long part of  the ring finger loss of sensation   12 Vascular system shows no peripheral edema normal capillary refill normal radial pulse       MEDICAL DECISION MAKING:    Data Reviewed     Assessment Right carpal tunnel syndrome after right volar plating   Plan After discussion patient agrees to right carpal tunnel release. She is already on gabapentin that's not helping and it is doubtful that splinting with help.   This procedure has been fully reviewed with the patient and written informed consent has been obtained.

## 2016-08-20 ENCOUNTER — Encounter (HOSPITAL_COMMUNITY)
Admission: RE | Disposition: A | Discharge status: Home / Self Care | Payer: Self-pay | Source: Ambulatory Visit | Attending: Orthopedic Surgery

## 2016-08-20 ENCOUNTER — Ambulatory Visit (HOSPITAL_COMMUNITY): Payer: Medicare Other | Admitting: Anesthesiology

## 2016-08-20 ENCOUNTER — Ambulatory Visit (HOSPITAL_COMMUNITY)
Admission: RE | Admit: 2016-08-20 | Discharge: 2016-08-20 | Disposition: A | Discharge status: Home / Self Care | Payer: Medicare Other | Source: Ambulatory Visit | Attending: Orthopedic Surgery | Admitting: Orthopedic Surgery

## 2016-08-20 ENCOUNTER — Encounter (HOSPITAL_COMMUNITY): Payer: Self-pay

## 2016-08-20 DIAGNOSIS — F329 Major depressive disorder, single episode, unspecified: Secondary | ICD-10-CM | POA: Diagnosis not present

## 2016-08-20 DIAGNOSIS — H8109 Meniere's disease, unspecified ear: Secondary | ICD-10-CM | POA: Diagnosis not present

## 2016-08-20 DIAGNOSIS — I129 Hypertensive chronic kidney disease with stage 1 through stage 4 chronic kidney disease, or unspecified chronic kidney disease: Secondary | ICD-10-CM | POA: Insufficient documentation

## 2016-08-20 DIAGNOSIS — E1140 Type 2 diabetes mellitus with diabetic neuropathy, unspecified: Secondary | ICD-10-CM | POA: Diagnosis not present

## 2016-08-20 DIAGNOSIS — I739 Peripheral vascular disease, unspecified: Secondary | ICD-10-CM | POA: Insufficient documentation

## 2016-08-20 DIAGNOSIS — Z9842 Cataract extraction status, left eye: Secondary | ICD-10-CM | POA: Insufficient documentation

## 2016-08-20 DIAGNOSIS — K219 Gastro-esophageal reflux disease without esophagitis: Secondary | ICD-10-CM | POA: Insufficient documentation

## 2016-08-20 DIAGNOSIS — Z794 Long term (current) use of insulin: Secondary | ICD-10-CM | POA: Insufficient documentation

## 2016-08-20 DIAGNOSIS — R5383 Other fatigue: Secondary | ICD-10-CM | POA: Diagnosis not present

## 2016-08-20 DIAGNOSIS — N183 Chronic kidney disease, stage 3 (moderate): Secondary | ICD-10-CM | POA: Diagnosis not present

## 2016-08-20 DIAGNOSIS — E785 Hyperlipidemia, unspecified: Secondary | ICD-10-CM | POA: Insufficient documentation

## 2016-08-20 DIAGNOSIS — G5601 Carpal tunnel syndrome, right upper limb: Secondary | ICD-10-CM | POA: Diagnosis not present

## 2016-08-20 DIAGNOSIS — Z8673 Personal history of transient ischemic attack (TIA), and cerebral infarction without residual deficits: Secondary | ICD-10-CM | POA: Diagnosis not present

## 2016-08-20 DIAGNOSIS — Z9049 Acquired absence of other specified parts of digestive tract: Secondary | ICD-10-CM | POA: Insufficient documentation

## 2016-08-20 DIAGNOSIS — I447 Left bundle-branch block, unspecified: Secondary | ICD-10-CM | POA: Diagnosis not present

## 2016-08-20 DIAGNOSIS — Z7982 Long term (current) use of aspirin: Secondary | ICD-10-CM | POA: Insufficient documentation

## 2016-08-20 DIAGNOSIS — Z90710 Acquired absence of both cervix and uterus: Secondary | ICD-10-CM | POA: Insufficient documentation

## 2016-08-20 DIAGNOSIS — Z79899 Other long term (current) drug therapy: Secondary | ICD-10-CM | POA: Insufficient documentation

## 2016-08-20 DIAGNOSIS — Z9841 Cataract extraction status, right eye: Secondary | ICD-10-CM | POA: Insufficient documentation

## 2016-08-20 DIAGNOSIS — E1122 Type 2 diabetes mellitus with diabetic chronic kidney disease: Secondary | ICD-10-CM | POA: Diagnosis not present

## 2016-08-20 HISTORY — PX: CARPAL TUNNEL RELEASE: SHX101

## 2016-08-20 LAB — GLUCOSE, CAPILLARY
GLUCOSE-CAPILLARY: 186 mg/dL — AB (ref 65–99)
Glucose-Capillary: 142 mg/dL — ABNORMAL HIGH (ref 65–99)

## 2016-08-20 SURGERY — CARPAL TUNNEL RELEASE
Anesthesia: Regional | Laterality: Right

## 2016-08-20 MED ORDER — MIDAZOLAM HCL 5 MG/5ML IJ SOLN
INTRAMUSCULAR | Status: DC | PRN
  Administered 2016-08-20: 2 mg via INTRAVENOUS

## 2016-08-20 MED ORDER — LIDOCAINE HCL (PF) 0.5 % IJ SOLN
INTRAMUSCULAR | Status: DC | PRN
  Administered 2016-08-20: 50 mL

## 2016-08-20 MED ORDER — BUPIVACAINE HCL (PF) 0.5 % IJ SOLN
INTRAMUSCULAR | Status: AC
  Filled 2016-08-20: qty 30

## 2016-08-20 MED ORDER — PROPOFOL 10 MG/ML IV BOLUS
INTRAVENOUS | Status: AC
  Filled 2016-08-20: qty 20

## 2016-08-20 MED ORDER — MIDAZOLAM HCL 2 MG/2ML IJ SOLN
INTRAMUSCULAR | Status: AC
  Filled 2016-08-20: qty 2

## 2016-08-20 MED ORDER — HYDROCODONE-ACETAMINOPHEN 5-325 MG PO TABS: 1.0000 | ORAL_TABLET | ORAL | 0 refills | Status: DC | PRN

## 2016-08-20 MED ORDER — 0.9 % SODIUM CHLORIDE (POUR BTL) OPTIME
TOPICAL | Status: DC | PRN
  Administered 2016-08-20: 1000 mL

## 2016-08-20 MED ORDER — LIDOCAINE HCL (PF) 0.5 % IJ SOLN
INTRAMUSCULAR | Status: AC
  Filled 2016-08-20: qty 50

## 2016-08-20 MED ORDER — LACTATED RINGERS IV SOLN
INTRAVENOUS | Status: DC
  Administered 2016-08-20: 09:00:00 via INTRAVENOUS

## 2016-08-20 MED ORDER — FENTANYL CITRATE (PF) 100 MCG/2ML IJ SOLN
INTRAMUSCULAR | Status: AC
Start: 2016-08-20 — End: ?
  Filled 2016-08-20: qty 2

## 2016-08-20 MED ORDER — FENTANYL CITRATE (PF) 100 MCG/2ML IJ SOLN
INTRAMUSCULAR | Status: AC
  Filled 2016-08-20: qty 2

## 2016-08-20 MED ORDER — FENTANYL CITRATE (PF) 100 MCG/2ML IJ SOLN
25.0000 ug | INTRAMUSCULAR | Status: AC
  Administered 2016-08-20 (×2): 25 ug via INTRAVENOUS

## 2016-08-20 MED ORDER — PROPOFOL 500 MG/50ML IV EMUL
INTRAVENOUS | Status: DC | PRN
  Administered 2016-08-20: 50 ug/kg/min via INTRAVENOUS

## 2016-08-20 MED ORDER — MIDAZOLAM HCL 2 MG/2ML IJ SOLN
1.0000 mg | INTRAMUSCULAR | Status: AC
  Administered 2016-08-20 (×2): 2 mg via INTRAVENOUS
  Filled 2016-08-20: qty 2

## 2016-08-20 MED ORDER — CEFAZOLIN SODIUM-DEXTROSE 2-4 GM/100ML-% IV SOLN
2.0000 g | INTRAVENOUS | Status: AC
  Administered 2016-08-20: 2 g via INTRAVENOUS
  Filled 2016-08-20: qty 100

## 2016-08-20 MED ORDER — CHLORHEXIDINE GLUCONATE 4 % EX LIQD: 60.0000 mL | Freq: Once | CUTANEOUS | Status: DC

## 2016-08-20 MED ORDER — FENTANYL CITRATE (PF) 100 MCG/2ML IJ SOLN: 25.0000 ug | INTRAMUSCULAR | Status: DC | PRN

## 2016-08-20 MED ORDER — BUPIVACAINE HCL (PF) 0.5 % IJ SOLN
INTRAMUSCULAR | Status: DC | PRN
  Administered 2016-08-20: 10 mL

## 2016-08-20 MED ORDER — FENTANYL CITRATE (PF) 100 MCG/2ML IJ SOLN
INTRAMUSCULAR | Status: DC | PRN
  Administered 2016-08-20: 25 ug via INTRAVENOUS

## 2016-08-20 SURGICAL SUPPLY — 42 items
BAG HAMPER (MISCELLANEOUS) ×2 IMPLANT
BANDAGE ELASTIC 2 VELCRO NS LF (GAUZE/BANDAGES/DRESSINGS) ×2 IMPLANT
BANDAGE ELASTIC 3 LF NS (GAUZE/BANDAGES/DRESSINGS) ×2 IMPLANT
BANDAGE ESMARK 4X12 BL STRL LF (DISPOSABLE) ×1 IMPLANT
BLADE SURG 15 STRL LF DISP TIS (BLADE) ×1 IMPLANT
BLADE SURG 15 STRL SS (BLADE) ×1
BNDG COHESIVE 4X5 TAN STRL (GAUZE/BANDAGES/DRESSINGS) ×2 IMPLANT
BNDG ESMARK 4X12 BLUE STRL LF (DISPOSABLE) ×2
BNDG GAUZE ELAST 4 BULKY (GAUZE/BANDAGES/DRESSINGS) ×2 IMPLANT
CHLORAPREP W/TINT 26ML (MISCELLANEOUS) ×2 IMPLANT
CLOTH BEACON ORANGE TIMEOUT ST (SAFETY) ×2 IMPLANT
COVER LIGHT HANDLE STERIS (MISCELLANEOUS) ×4 IMPLANT
CUFF TOURNIQUET SINGLE 18IN (TOURNIQUET CUFF) ×2 IMPLANT
DECANTER SPIKE VIAL GLASS SM (MISCELLANEOUS) ×2 IMPLANT
DRAPE PROXIMA HALF (DRAPES) ×2 IMPLANT
DRSG XEROFORM 1X8 (GAUZE/BANDAGES/DRESSINGS) IMPLANT
ELECT NEEDLE TIP 2.8 STRL (NEEDLE) ×2 IMPLANT
ELECT REM PT RETURN 9FT ADLT (ELECTROSURGICAL) ×2
ELECTRODE REM PT RTRN 9FT ADLT (ELECTROSURGICAL) ×1 IMPLANT
GAUZE SPONGE 4X4 12PLY STRL (GAUZE/BANDAGES/DRESSINGS) IMPLANT
GAUZE XEROFORM 1X8 LF (GAUZE/BANDAGES/DRESSINGS) ×2 IMPLANT
GLOVE BIO SURGEON STRL SZ7 (GLOVE) ×2 IMPLANT
GLOVE BIOGEL PI IND STRL 6.5 (GLOVE) ×1 IMPLANT
GLOVE BIOGEL PI IND STRL 7.0 (GLOVE) ×1 IMPLANT
GLOVE BIOGEL PI INDICATOR 6.5 (GLOVE) ×1
GLOVE BIOGEL PI INDICATOR 7.0 (GLOVE) ×1
GLOVE SKINSENSE NS SZ8.0 LF (GLOVE) ×1
GLOVE SKINSENSE STRL SZ8.0 LF (GLOVE) ×1 IMPLANT
GLOVE SS N UNI LF 8.5 STRL (GLOVE) ×2 IMPLANT
GOWN STRL REUS W/ TWL LRG LVL3 (GOWN DISPOSABLE) ×1 IMPLANT
GOWN STRL REUS W/TWL LRG LVL3 (GOWN DISPOSABLE) ×1
GOWN STRL REUS W/TWL XL LVL3 (GOWN DISPOSABLE) ×2 IMPLANT
HAND ALUMI XLG (SOFTGOODS) ×2 IMPLANT
KIT ROOM TURNOVER APOR (KITS) ×2 IMPLANT
MANIFOLD NEPTUNE II (INSTRUMENTS) ×2 IMPLANT
NEEDLE HYPO 21X1.5 SAFETY (NEEDLE) ×2 IMPLANT
NS IRRIG 1000ML POUR BTL (IV SOLUTION) ×2 IMPLANT
PACK BASIC LIMB (CUSTOM PROCEDURE TRAY) ×2 IMPLANT
PAD ARMBOARD 7.5X6 YLW CONV (MISCELLANEOUS) ×2 IMPLANT
SET BASIN LINEN APH (SET/KITS/TRAYS/PACK) ×2 IMPLANT
SUT ETHILON 3 0 FSL (SUTURE) ×2 IMPLANT
SYR CONTROL 10ML LL (SYRINGE) ×2 IMPLANT

## 2016-08-20 NOTE — Interval H&P Note (Signed)
History and Physical Interval Note:  08/20/2016 10:31 AM  Angela Harris  has presented today for surgery, with the diagnosis of RIGHT CARPAL TUNNEL SYNDROME  The various methods of treatment have been discussed with the patient and family. After consideration of risks, benefits and other options for treatment, the patient has consented to  Procedure(s): CARPAL TUNNEL RELEASE (Right) as a surgical intervention .  The patient's history has been reviewed, patient examined, no change in status, stable for surgery.  I have reviewed the patient's chart and labs.  Questions were answered to the patient's satisfaction.     Arther Abbott

## 2016-08-20 NOTE — Anesthesia Procedure Notes (Signed)
Procedure Name: MAC Date/Time: 08/20/2016 10:36 AM Performed by: Andree Elk, Rahaf Carbonell A Pre-anesthesia Checklist: Patient identified, Timeout performed, Emergency Drugs available, Suction available and Patient being monitored Oxygen Delivery Method: Simple face mask

## 2016-08-20 NOTE — Anesthesia Procedure Notes (Signed)
Anesthesia Regional Block: Bier block (IV Regional)   Pre-Anesthetic Checklist: ,, timeout performed, Correct Patient, Correct Site, Correct Laterality, Correct Procedure,, site marked, surgical consent,, at surgeon's request  Laterality: Right     Needles:  Injection technique: Single-shot  Needle Type: Other      Needle Gauge: 20     Additional Needles:   Procedures:,,,,,,, Esmarch exsanguination, single tourniquet utilized,   Nerve Stimulator or Paresthesia:   Additional Responses:  Pulse checked post tourniquet inflation. IV NSL discontinued post injection. Narrative:  End time: 08/20/2016 10:50 AM  Performed by: Personally

## 2016-08-20 NOTE — Anesthesia Procedure Notes (Signed)
Performed by: Andree Elk, AMY A

## 2016-08-20 NOTE — Transfer of Care (Signed)
Immediate Anesthesia Transfer of Care Note  Patient: [name]  Procedure(s) Performed: Procedure(s): CARPAL TUNNEL RELEASE (Right)  Patient Location: PACU  Anesthesia Type:MAC and Bier block  Level of Consciousness: drowsy and patient cooperative  Airway & Oxygen Therapy: Patient Spontanous Breathing and Patient connected to nasal cannula oxygen  Post-op Assessment: Report given to RN and Post -op Vital signs reviewed and stable  Post vital signs: Reviewed and stable  Last Vitals:  Vitals:   08/20/16 0845 08/20/16 0850  BP: (!) 154/67 135/67  Pulse:    Resp: (P) 12 18  Temp:      Last Pain: There were no vitals filed for this visit.    Patients Stated Pain Goal: 8 (53/74/82 [id number])  Complications: No apparent anesthesia complications

## 2016-08-20 NOTE — Anesthesia Postprocedure Evaluation (Signed)
Anesthesia Post Note  Patient: Angela Harris  Procedure(s) Performed: Procedure(s) (LRB): CARPAL TUNNEL RELEASE (Right)  Patient location during evaluation: PACU Anesthesia Type: Bier Block Level of consciousness: patient cooperative, oriented and awake Pain management: pain level controlled Vital Signs Assessment: post-procedure vital signs reviewed and stable Respiratory status: spontaneous breathing and patient connected to nasal cannula oxygen Cardiovascular status: stable Postop Assessment: no signs of nausea or vomiting Anesthetic complications: no     Last Vitals:  Vitals:   08/20/16 0845 08/20/16 0850  BP: (!) 154/67 135/67  Pulse:    Resp: (P) 12 18  Temp:      Last Pain: There were no vitals filed for this visit.               Mel Langan A

## 2016-08-20 NOTE — Anesthesia Preprocedure Evaluation (Signed)
Anesthesia Evaluation  Patient identified by MRN, date of birth, ID band Patient awake    Reviewed: Allergy & Precautions, H&P , NPO status , Patient's Chart, lab work & pertinent test results  History of Anesthesia Complications (+) PONV and history of anesthetic complications  Airway Mallampati: II   Neck ROM: full    Dental  (+) Teeth Intact   Pulmonary neg pulmonary ROS,    breath sounds clear to auscultation       Cardiovascular hypertension, Pt. on medications + Peripheral Vascular Disease   Rhythm:regular Rate:Normal  LBBB   Neuro/Psych  Headaches, PSYCHIATRIC DISORDERS Depression TIA Neuromuscular disease CVA, No Residual Symptoms    GI/Hepatic GERD  ,  Endo/Other  diabetes, Type 2  Renal/GU Renal InsufficiencyRenal disease     Musculoskeletal   Abdominal   Peds  Hematology   Anesthesia Other Findings   Reproductive/Obstetrics                             Anesthesia Physical Anesthesia Plan  ASA: III  Anesthesia Plan: Bier Block   Post-op Pain Management:    Induction: Intravenous  Airway Management Planned: Simple Face Mask  Additional Equipment:   Intra-op Plan:   Post-operative Plan:   Informed Consent: I have reviewed the patients History and Physical, chart, labs and discussed the procedure including the risks, benefits and alternatives for the proposed anesthesia with the patient or authorized representative who has indicated his/her understanding and acceptance.     Plan Discussed with:   Anesthesia Plan Comments:         Anesthesia Quick Evaluation

## 2016-08-20 NOTE — Op Note (Signed)
Carpal tunnel release right wrist  Preop diagnosis carpal tunnel syndrome right wrist postop diagnosis same  Procedure open carpal tunnel release right wrist  Surgeon Aline Brochure  Anesthesia regional Bier block  Operative findings severe flattening of the nerve beneath the transverse carpal ligament   Indications failure of conservative treatment to relieve pain and paresthesias and numbness and tingling of the right hand.  The patient was identified in the preop area we confirm the surgical site marked as right wrist. Chart update completed. Patient taken to surgery. She had 2 g of Ancef. After establishing a Bier block her arm was prepped with ChloraPrep.  Timeout executed completed and confirmed site.  A straight incision was made over the carpal tunnel in line with the radial border of the ring finger. Blunt dissection was carried out to find the distal aspect of the carpal tunnel. A blunted instrument  was passed beneath the carpal tunnel. Sharp incision was then used to release the transverse carpal ligament. The contents of the carpal tunnel were inspected. The median nerve was compressed with discoloration directly beneath the ligament   The wound was irrigated and then closed with 3-0 nylon suture. We injected 10 mL of plain Marcaine on the radial side of the incision  A sterile bandage was applied and the tourniquet was released the color of the hand and capillary refill were normal  The patient was taken to the recovery room in stable condition  [phone]

## 2016-08-20 NOTE — Brief Op Note (Signed)
08/20/2016  11:19 AM  PATIENT:  Angela Harris  71 y.o. female  PRE-OPERATIVE DIAGNOSIS:  RIGHT CARPAL TUNNEL SYNDROME  POST-OPERATIVE DIAGNOSIS:  RIGHT CARPAL TUNNEL SYNDROME  PROCEDURE:  Procedure(s): CARPAL TUNNEL RELEASE (Right)  [phone]   SURGEON:  Surgeon(s) and Role:    * Carole Civil, MD - Primary  PHYSICIAN ASSISTANT:   ASSISTANTS: none   ANESTHESIA:   regional  EBL:  Total I/O In: 700 [I.V.:700] Out: 2 [Blood:2]  BLOOD ADMINISTERED:none  DRAINS: none   LOCAL MEDICATIONS USED:  MARCAINE     SPECIMEN:  No Specimen  DISPOSITION OF SPECIMEN:  N/A  COUNTS:  YES  TOURNIQUET:  * Missing tourniquet times found for documented tourniquets in log:  [id number] *  DICTATION: .Dragon Dictation  PLAN OF CARE: Discharge to home after PACU  PATIENT DISPOSITION:  PACU - hemodynamically stable.   Delay start of Pharmacological VTE agent (>24hrs) due to surgical blood loss or risk of bleeding: not applicable  [id number]

## 2016-08-24 ENCOUNTER — Encounter (HOSPITAL_COMMUNITY): Payer: Self-pay | Admitting: Orthopedic Surgery

## 2016-08-24 ENCOUNTER — Ambulatory Visit (INDEPENDENT_AMBULATORY_CARE_PROVIDER_SITE_OTHER): Payer: Self-pay | Admitting: Orthopedic Surgery

## 2016-08-24 DIAGNOSIS — E782 Mixed hyperlipidemia: Secondary | ICD-10-CM | POA: Diagnosis not present

## 2016-08-24 DIAGNOSIS — Z4889 Encounter for other specified surgical aftercare: Secondary | ICD-10-CM

## 2016-08-24 DIAGNOSIS — E1165 Type 2 diabetes mellitus with hyperglycemia: Secondary | ICD-10-CM | POA: Diagnosis not present

## 2016-08-24 DIAGNOSIS — D500 Iron deficiency anemia secondary to blood loss (chronic): Secondary | ICD-10-CM | POA: Diagnosis not present

## 2016-08-24 DIAGNOSIS — G5601 Carpal tunnel syndrome, right upper limb: Secondary | ICD-10-CM

## 2016-08-24 DIAGNOSIS — E876 Hypokalemia: Secondary | ICD-10-CM | POA: Diagnosis not present

## 2016-08-24 DIAGNOSIS — E611 Iron deficiency: Secondary | ICD-10-CM | POA: Diagnosis not present

## 2016-08-24 DIAGNOSIS — D529 Folate deficiency anemia, unspecified: Secondary | ICD-10-CM | POA: Diagnosis not present

## 2016-08-24 DIAGNOSIS — D519 Vitamin B12 deficiency anemia, unspecified: Secondary | ICD-10-CM | POA: Diagnosis not present

## 2016-08-24 MED ORDER — HYDROCODONE-ACETAMINOPHEN 5-325 MG PO TABS: 1.0000 | ORAL_TABLET | ORAL | 0 refills | Status: DC | PRN

## 2016-08-24 NOTE — Patient Instructions (Signed)
Exercises for the hand as discussed   Ice as needed   Keep clean and dry (wear a glove to shower keep the hand up; if the incision gets wet gently dry it and reapply a dressing)

## 2016-08-24 NOTE — Progress Notes (Signed)
  Sicily is status post right carpal tunnel release   S:  No chief complaint on file.  Patient reports that they're doing well with mild discomfort.  The incision is clean dry and intact with no drainage The dressing was changed.  Patient to return for suture removal ~ POD 12-14  Meds ordered this encounter  Medications  . HYDROcodone-acetaminophen (NORCO/VICODIN) 5-325 MG tablet    Sig: Take 1 tablet by mouth every 4 (four) hours as needed for moderate pain.    Dispense:  42 tablet    Refill:  0

## 2016-08-26 DIAGNOSIS — E782 Mixed hyperlipidemia: Secondary | ICD-10-CM | POA: Diagnosis not present

## 2016-08-26 DIAGNOSIS — F331 Major depressive disorder, recurrent, moderate: Secondary | ICD-10-CM | POA: Diagnosis not present

## 2016-08-26 DIAGNOSIS — E1122 Type 2 diabetes mellitus with diabetic chronic kidney disease: Secondary | ICD-10-CM | POA: Diagnosis not present

## 2016-08-26 DIAGNOSIS — N183 Chronic kidney disease, stage 3 (moderate): Secondary | ICD-10-CM | POA: Diagnosis not present

## 2016-08-26 DIAGNOSIS — D500 Iron deficiency anemia secondary to blood loss (chronic): Secondary | ICD-10-CM | POA: Diagnosis not present

## 2016-08-26 DIAGNOSIS — Z1212 Encounter for screening for malignant neoplasm of rectum: Secondary | ICD-10-CM | POA: Diagnosis not present

## 2016-08-26 DIAGNOSIS — I10 Essential (primary) hypertension: Secondary | ICD-10-CM | POA: Diagnosis not present

## 2016-08-26 DIAGNOSIS — K219 Gastro-esophageal reflux disease without esophagitis: Secondary | ICD-10-CM | POA: Diagnosis not present

## 2016-09-04 ENCOUNTER — Ambulatory Visit (INDEPENDENT_AMBULATORY_CARE_PROVIDER_SITE_OTHER): Payer: Medicare Other | Admitting: Orthopedic Surgery

## 2016-09-04 ENCOUNTER — Encounter: Payer: Self-pay | Admitting: Orthopedic Surgery

## 2016-09-04 DIAGNOSIS — Z4889 Encounter for other specified surgical aftercare: Secondary | ICD-10-CM | POA: Diagnosis not present

## 2016-09-04 DIAGNOSIS — M65312 Trigger thumb, left thumb: Secondary | ICD-10-CM | POA: Diagnosis not present

## 2016-09-04 DIAGNOSIS — G5601 Carpal tunnel syndrome, right upper limb: Secondary | ICD-10-CM

## 2016-09-04 NOTE — Progress Notes (Signed)
POST OP   Chief Complaint  Patient presents with  . Follow-up    Post op right CTR, DOS 08-20-16.   Status post right carpal tunnel release. Here today for suture removal  Wound looks clean dry and intact sutures were removed  Recommend active range of motion and strengthening exercises. Follow-up in 6 weeks  New problem Chief complaint triggering of the left thumb  She 71 years old she does have a right carpal tunnel release she complains of catching locking of the left thumb with pain over the A1 pulley for several days now.  Review of systems right ring finger history of triggering starting to hurt over the A1 pulley  Exam of the left thumb shows tenderness over the A1 pulley normal alignment she has a bone spur over the IP joint The flexor tendon strength is intact color and perfusion are normal the radial pulses normal She is otherwise awake alert and oriented 3 mood and affect are normal she has no sensory deficit in the left thumb  Left trigger thumb  Left trigger thumb injection  Left Trigger thumb injection Medication  1 mL of 40 mg Depo-Medrol  2 mL of 1% lidocaine plain  Ethyl chloride for anesthesia  Verbal consent was obtained timeout was taken to confirm the injection site as left thumb  Alcohol was used to prepare the skin along with ethyl chloride and then the injection was made at the A1 pulley there were no complications

## 2016-09-04 NOTE — Patient Instructions (Signed)
ACTIVE RANGE OF MOTION AND GRIP STRENGTHENING

## 2016-10-19 ENCOUNTER — Ambulatory Visit (INDEPENDENT_AMBULATORY_CARE_PROVIDER_SITE_OTHER): Payer: Medicare Other | Admitting: Orthopedic Surgery

## 2016-10-19 DIAGNOSIS — M65341 Trigger finger, right ring finger: Secondary | ICD-10-CM

## 2016-10-19 NOTE — Progress Notes (Signed)
Patient ID: Angela Harris, female   DOB: [birth year], 71 y.o.   MRN: [id number]  Chief Complaint  Patient presents with  . Follow-up    Carpal tunnel release from May 11  . Hand Pain    Triggering right ring finger    HPI 71 years old had a right carpal tunnel release did well most of her symptoms have resolved she's having some burning sensation in the palm but today she is more concerned about triggering catching locking decreased grip related to triggering of the right ring finger Review of Systems  Constitutional: Negative for fever.  Neurological: Negative for tingling and sensory change.    There were no vitals taken for this visit. Gen. appearance is normal grooming and hygiene normal Orientation to person place and time normal Mood normal   Ortho Exam Carpal tunnel incision is clean dry and intact a little bit sensitive to touch she has tenderness over the A1 pulley with some decreased flexion but normal strength in the flexor tendons  No sensory abnormalities color and capillary refill normal  A/P  Medical decision-making  Encounter Diagnosis  Name Primary?  . Trigger ring finger of right hand Yes     No orders of the defined types were placed in this encounter.    Trigger finger injection  Diagnosis  A1 pulley tenosynovitis right ring finger Procedure injection A1 pulley Medications lidocaine 1% 1 mL and Depo-Medrol 40 mg 1 mL Skin prep alcohol and ethyl chloride Verbal consent was obtained Timeout confirmed the injection site  After cleaning the skin with alcohol and anesthetizing the skin with ethyl chloride the A1 pulley was palpated and the injection was performed without complication   When necessary follow-up   Arther Abbott, MD 10/19/2016 2:15 PM

## 2016-10-28 DIAGNOSIS — D529 Folate deficiency anemia, unspecified: Secondary | ICD-10-CM | POA: Diagnosis not present

## 2016-10-28 DIAGNOSIS — D519 Vitamin B12 deficiency anemia, unspecified: Secondary | ICD-10-CM | POA: Diagnosis not present

## 2016-10-28 DIAGNOSIS — D500 Iron deficiency anemia secondary to blood loss (chronic): Secondary | ICD-10-CM | POA: Diagnosis not present

## 2016-11-24 ENCOUNTER — Other Ambulatory Visit: Payer: Self-pay | Admitting: "Endocrinology

## 2016-12-21 ENCOUNTER — Ambulatory Visit (INDEPENDENT_AMBULATORY_CARE_PROVIDER_SITE_OTHER): Payer: Medicare Other | Admitting: Orthopedic Surgery

## 2016-12-21 DIAGNOSIS — M65341 Trigger finger, right ring finger: Secondary | ICD-10-CM

## 2016-12-21 NOTE — Progress Notes (Signed)
Patient ID: Angela Harris, female   DOB: [birth year], 71 y.o.   MRN: [id number]   FOLLOW UP VISIT    Chief Complaint  Patient presents with  . Hand Pain    right thumb pain stiffness in the am x 2 weeks a little better today     S/p orif right distal radius fracture   S/p Rt CTR   She was having triggering of the right thumb but, it resolved with ibuprofen    Review of Systems  Neurological: Negative for tingling and sensory change.      Right Hand Exam   Tenderness  The patient is experiencing no tenderness.     Range of Motion  The patient has normal right wrist ROM.   Muscle Strength  The patient has normal right wrist strength.  Tests  Phalen's Sign: negative Tinel's Sign (Medial Nerve): negative Finkelstein: negative  Other  Erythema: absent Scars: present Sensation: normal Pulse: present  Comments:  A1 pulley area right thumb tenderness       A/P  Medical decision-making  Encounter Diagnosis  Name Primary?  . Trigger ring finger of right hand Yes     Meds ordered this encounter  Medications  . FERREX 150 150 MG capsule  . metFORMIN (GLUCOPHAGE-XR) 500 MG 24 hr tablet     No triggering at present  Bradley to come in same day or next day for injection any time needed     Arther Abbott, MD 12/21/2016 8:42 PM

## 2016-12-29 DIAGNOSIS — D500 Iron deficiency anemia secondary to blood loss (chronic): Secondary | ICD-10-CM | POA: Diagnosis not present

## 2016-12-29 DIAGNOSIS — E782 Mixed hyperlipidemia: Secondary | ICD-10-CM | POA: Diagnosis not present

## 2016-12-29 DIAGNOSIS — Z9189 Other specified personal risk factors, not elsewhere classified: Secondary | ICD-10-CM | POA: Diagnosis not present

## 2016-12-29 DIAGNOSIS — E871 Hypo-osmolality and hyponatremia: Secondary | ICD-10-CM | POA: Diagnosis not present

## 2016-12-29 DIAGNOSIS — E1165 Type 2 diabetes mellitus with hyperglycemia: Secondary | ICD-10-CM | POA: Diagnosis not present

## 2016-12-29 DIAGNOSIS — Z0001 Encounter for general adult medical examination with abnormal findings: Secondary | ICD-10-CM | POA: Diagnosis not present

## 2016-12-29 DIAGNOSIS — E876 Hypokalemia: Secondary | ICD-10-CM | POA: Diagnosis not present

## 2016-12-29 DIAGNOSIS — K210 Gastro-esophageal reflux disease with esophagitis: Secondary | ICD-10-CM | POA: Diagnosis not present

## 2016-12-29 DIAGNOSIS — Z6829 Body mass index (BMI) 29.0-29.9, adult: Secondary | ICD-10-CM | POA: Diagnosis not present

## 2016-12-29 DIAGNOSIS — I10 Essential (primary) hypertension: Secondary | ICD-10-CM | POA: Diagnosis not present

## 2016-12-29 DIAGNOSIS — E1122 Type 2 diabetes mellitus with diabetic chronic kidney disease: Secondary | ICD-10-CM | POA: Diagnosis not present

## 2017-01-04 DIAGNOSIS — N183 Chronic kidney disease, stage 3 (moderate): Secondary | ICD-10-CM | POA: Diagnosis not present

## 2017-01-04 DIAGNOSIS — Z1389 Encounter for screening for other disorder: Secondary | ICD-10-CM | POA: Diagnosis not present

## 2017-01-04 DIAGNOSIS — D500 Iron deficiency anemia secondary to blood loss (chronic): Secondary | ICD-10-CM | POA: Diagnosis not present

## 2017-01-04 DIAGNOSIS — Z6830 Body mass index (BMI) 30.0-30.9, adult: Secondary | ICD-10-CM | POA: Diagnosis not present

## 2017-01-04 DIAGNOSIS — Z1212 Encounter for screening for malignant neoplasm of rectum: Secondary | ICD-10-CM | POA: Diagnosis not present

## 2017-01-04 DIAGNOSIS — I10 Essential (primary) hypertension: Secondary | ICD-10-CM | POA: Diagnosis not present

## 2017-01-04 DIAGNOSIS — E1122 Type 2 diabetes mellitus with diabetic chronic kidney disease: Secondary | ICD-10-CM | POA: Diagnosis not present

## 2017-01-04 DIAGNOSIS — E782 Mixed hyperlipidemia: Secondary | ICD-10-CM | POA: Diagnosis not present

## 2017-02-05 DIAGNOSIS — Z1231 Encounter for screening mammogram for malignant neoplasm of breast: Secondary | ICD-10-CM | POA: Diagnosis not present

## 2017-02-12 DIAGNOSIS — Z6830 Body mass index (BMI) 30.0-30.9, adult: Secondary | ICD-10-CM | POA: Diagnosis not present

## 2017-02-12 DIAGNOSIS — J019 Acute sinusitis, unspecified: Secondary | ICD-10-CM | POA: Diagnosis not present

## 2017-02-12 DIAGNOSIS — B001 Herpesviral vesicular dermatitis: Secondary | ICD-10-CM | POA: Diagnosis not present

## 2017-03-03 DIAGNOSIS — Z23 Encounter for immunization: Secondary | ICD-10-CM | POA: Diagnosis not present

## 2017-03-15 DIAGNOSIS — Z6829 Body mass index (BMI) 29.0-29.9, adult: Secondary | ICD-10-CM | POA: Diagnosis not present

## 2017-03-15 DIAGNOSIS — F339 Major depressive disorder, recurrent, unspecified: Secondary | ICD-10-CM | POA: Diagnosis not present

## 2017-03-15 DIAGNOSIS — C642 Malignant neoplasm of left kidney, except renal pelvis: Secondary | ICD-10-CM | POA: Diagnosis not present

## 2017-03-15 DIAGNOSIS — K5780 Diverticulitis of intestine, part unspecified, with perforation and abscess without bleeding: Secondary | ICD-10-CM | POA: Diagnosis not present

## 2017-03-15 DIAGNOSIS — E118 Type 2 diabetes mellitus with unspecified complications: Secondary | ICD-10-CM | POA: Diagnosis not present

## 2017-03-15 DIAGNOSIS — R1032 Left lower quadrant pain: Secondary | ICD-10-CM | POA: Diagnosis not present

## 2017-03-15 DIAGNOSIS — K5732 Diverticulitis of large intestine without perforation or abscess without bleeding: Secondary | ICD-10-CM | POA: Diagnosis not present

## 2017-03-15 DIAGNOSIS — N390 Urinary tract infection, site not specified: Secondary | ICD-10-CM | POA: Diagnosis not present

## 2017-03-15 DIAGNOSIS — R112 Nausea with vomiting, unspecified: Secondary | ICD-10-CM | POA: Diagnosis not present

## 2017-03-15 DIAGNOSIS — B954 Other streptococcus as the cause of diseases classified elsewhere: Secondary | ICD-10-CM | POA: Diagnosis not present

## 2017-03-15 DIAGNOSIS — R1111 Vomiting without nausea: Secondary | ICD-10-CM | POA: Diagnosis not present

## 2017-03-15 DIAGNOSIS — E860 Dehydration: Secondary | ICD-10-CM | POA: Diagnosis not present

## 2017-03-15 DIAGNOSIS — I129 Hypertensive chronic kidney disease with stage 1 through stage 4 chronic kidney disease, or unspecified chronic kidney disease: Secondary | ICD-10-CM | POA: Diagnosis not present

## 2017-03-15 DIAGNOSIS — N2889 Other specified disorders of kidney and ureter: Secondary | ICD-10-CM | POA: Diagnosis not present

## 2017-03-15 DIAGNOSIS — K6389 Other specified diseases of intestine: Secondary | ICD-10-CM | POA: Diagnosis not present

## 2017-03-16 DIAGNOSIS — N183 Chronic kidney disease, stage 3 (moderate): Secondary | ICD-10-CM | POA: Diagnosis present

## 2017-03-16 DIAGNOSIS — N390 Urinary tract infection, site not specified: Secondary | ICD-10-CM | POA: Diagnosis present

## 2017-03-16 DIAGNOSIS — C642 Malignant neoplasm of left kidney, except renal pelvis: Secondary | ICD-10-CM | POA: Diagnosis present

## 2017-03-16 DIAGNOSIS — I129 Hypertensive chronic kidney disease with stage 1 through stage 4 chronic kidney disease, or unspecified chronic kidney disease: Secondary | ICD-10-CM | POA: Diagnosis present

## 2017-03-16 DIAGNOSIS — K5732 Diverticulitis of large intestine without perforation or abscess without bleeding: Secondary | ICD-10-CM | POA: Diagnosis not present

## 2017-03-16 DIAGNOSIS — Z794 Long term (current) use of insulin: Secondary | ICD-10-CM | POA: Diagnosis not present

## 2017-03-16 DIAGNOSIS — I700 Atherosclerosis of aorta: Secondary | ICD-10-CM | POA: Diagnosis present

## 2017-03-16 DIAGNOSIS — Z7982 Long term (current) use of aspirin: Secondary | ICD-10-CM | POA: Diagnosis not present

## 2017-03-16 DIAGNOSIS — B954 Other streptococcus as the cause of diseases classified elsewhere: Secondary | ICD-10-CM | POA: Diagnosis present

## 2017-03-16 DIAGNOSIS — Z79899 Other long term (current) drug therapy: Secondary | ICD-10-CM | POA: Diagnosis not present

## 2017-03-16 DIAGNOSIS — N200 Calculus of kidney: Secondary | ICD-10-CM | POA: Diagnosis not present

## 2017-03-16 DIAGNOSIS — E1142 Type 2 diabetes mellitus with diabetic polyneuropathy: Secondary | ICD-10-CM | POA: Diagnosis present

## 2017-03-16 DIAGNOSIS — D509 Iron deficiency anemia, unspecified: Secondary | ICD-10-CM | POA: Diagnosis present

## 2017-03-16 DIAGNOSIS — E1122 Type 2 diabetes mellitus with diabetic chronic kidney disease: Secondary | ICD-10-CM | POA: Diagnosis present

## 2017-03-16 DIAGNOSIS — F339 Major depressive disorder, recurrent, unspecified: Secondary | ICD-10-CM | POA: Diagnosis present

## 2017-03-16 DIAGNOSIS — E785 Hyperlipidemia, unspecified: Secondary | ICD-10-CM | POA: Diagnosis present

## 2017-03-30 DIAGNOSIS — C642 Malignant neoplasm of left kidney, except renal pelvis: Secondary | ICD-10-CM | POA: Diagnosis not present

## 2017-04-07 ENCOUNTER — Other Ambulatory Visit: Payer: Self-pay | Admitting: Urology

## 2017-04-09 DIAGNOSIS — E1165 Type 2 diabetes mellitus with hyperglycemia: Secondary | ICD-10-CM | POA: Diagnosis not present

## 2017-04-09 DIAGNOSIS — Z9189 Other specified personal risk factors, not elsewhere classified: Secondary | ICD-10-CM | POA: Diagnosis not present

## 2017-04-09 DIAGNOSIS — E871 Hypo-osmolality and hyponatremia: Secondary | ICD-10-CM | POA: Diagnosis not present

## 2017-04-09 DIAGNOSIS — E1122 Type 2 diabetes mellitus with diabetic chronic kidney disease: Secondary | ICD-10-CM | POA: Diagnosis not present

## 2017-04-09 DIAGNOSIS — E782 Mixed hyperlipidemia: Secondary | ICD-10-CM | POA: Diagnosis not present

## 2017-04-09 DIAGNOSIS — K210 Gastro-esophageal reflux disease with esophagitis: Secondary | ICD-10-CM | POA: Diagnosis not present

## 2017-04-09 DIAGNOSIS — D500 Iron deficiency anemia secondary to blood loss (chronic): Secondary | ICD-10-CM | POA: Diagnosis not present

## 2017-04-09 DIAGNOSIS — I10 Essential (primary) hypertension: Secondary | ICD-10-CM | POA: Diagnosis not present

## 2017-04-09 DIAGNOSIS — F324 Major depressive disorder, single episode, in partial remission: Secondary | ICD-10-CM | POA: Diagnosis not present

## 2017-04-09 DIAGNOSIS — E876 Hypokalemia: Secondary | ICD-10-CM | POA: Diagnosis not present

## 2017-04-12 ENCOUNTER — Encounter: Payer: Self-pay | Admitting: Orthopedic Surgery

## 2017-04-12 ENCOUNTER — Ambulatory Visit (INDEPENDENT_AMBULATORY_CARE_PROVIDER_SITE_OTHER): Payer: Medicare Other | Admitting: Orthopedic Surgery

## 2017-04-12 DIAGNOSIS — M65341 Trigger finger, right ring finger: Secondary | ICD-10-CM

## 2017-04-12 NOTE — Progress Notes (Signed)
Right ring finger triggering   Recurrent   Trigger finger injection  Diagnosis  Right ring finger  Procedure injection A1 pulley Medications lidocaine 1% 1 mL and Depo-Medrol 40 mg 1 mL Skin prep alcohol and ethyl chloride Verbal consent was obtained Timeout confirmed the injection site  After cleaning the skin with alcohol and anesthetizing the skin with ethyl chloride the A1 pulley was palpated and the injection was performed without complication  Encounter Diagnosis  Name Primary?  . Trigger ring finger of right hand Yes

## 2017-04-14 DIAGNOSIS — I10 Essential (primary) hypertension: Secondary | ICD-10-CM | POA: Diagnosis not present

## 2017-04-14 DIAGNOSIS — Z6830 Body mass index (BMI) 30.0-30.9, adult: Secondary | ICD-10-CM | POA: Diagnosis not present

## 2017-04-14 DIAGNOSIS — E782 Mixed hyperlipidemia: Secondary | ICD-10-CM | POA: Diagnosis not present

## 2017-04-14 DIAGNOSIS — E1122 Type 2 diabetes mellitus with diabetic chronic kidney disease: Secondary | ICD-10-CM | POA: Diagnosis not present

## 2017-04-14 DIAGNOSIS — N183 Chronic kidney disease, stage 3 (moderate): Secondary | ICD-10-CM | POA: Diagnosis not present

## 2017-04-14 DIAGNOSIS — D500 Iron deficiency anemia secondary to blood loss (chronic): Secondary | ICD-10-CM | POA: Diagnosis not present

## 2017-04-14 DIAGNOSIS — C642 Malignant neoplasm of left kidney, except renal pelvis: Secondary | ICD-10-CM | POA: Diagnosis not present

## 2017-04-14 DIAGNOSIS — Z1212 Encounter for screening for malignant neoplasm of rectum: Secondary | ICD-10-CM | POA: Diagnosis not present

## 2017-04-20 DIAGNOSIS — C642 Malignant neoplasm of left kidney, except renal pelvis: Secondary | ICD-10-CM | POA: Diagnosis not present

## 2017-04-28 DIAGNOSIS — K5732 Diverticulitis of large intestine without perforation or abscess without bleeding: Secondary | ICD-10-CM | POA: Diagnosis not present

## 2017-04-28 DIAGNOSIS — Z6829 Body mass index (BMI) 29.0-29.9, adult: Secondary | ICD-10-CM | POA: Diagnosis not present

## 2017-05-05 NOTE — Progress Notes (Signed)
LOV Dr Gar Ponto 04-14-17 on chart  Cmp, hgba1c, lipid panel, tsh 04-09-17 [facility]  ECHO 03-30-16 epic  Stress 01-09-16 epic  [place] cardiology Dr Rozann Lesches 02-19-16 epic

## 2017-05-05 NOTE — Patient Instructions (Signed)
Angela Harris  05/05/2017   Your procedure is scheduled on: 05-12-17  Report to [facility] Main  Entrance Take [place]  elevators to 3rd floor to  [place] [facility] [place].    Call this number if you have problems the morning of surgery [phone]    Remember: ONLY 1 PERSON MAY GO WITH YOU TO SHORT STAY TO GET  READY MORNING OF YOUR SURGERY.    Do not eat food After Midnight on Monday 05-10-17. Drink plenty of clear liquids all day Tuesday 05-11-17 and follow all bowel prep instructions provided by your surgeon. Nothing by  mouth after midnight on Tuesday!     Take these medicines the morning of surgery with A SIP OF WATER: amlodipine, atenolol, gabapentin if needed, venlafaxine(effexor), tylenol if needed                                You may not have any metal on your body including hair pins and              piercings  Do not wear jewelry, make-up, lotions, powders or perfumes, deodorant             Do not wear nail polish.  Do not shave  48 hours prior to surgery.                Do not bring valuables to the hospital. [place].  Contacts, dentures or bridgework may not be worn into surgery.  Leave suitcase in the car. After surgery it may be brought to your room.                Please read over the following fact sheets you were given: _____________________________________________________________________    CLEAR LIQUID DIET   Foods Allowed                                                                     Foods Excluded  Coffee and tea, regular and decaf                             liquids that you cannot  Plain Jell-O in any flavor                                             see through such as: Fruit ices (not with fruit pulp)                                     milk, soups, orange juice  Iced Popsicles                                    All  solid food Carbonated beverages, regular and  diet                                    Cranberry, grape and apple juices Sports drinks like Gatorade Lightly seasoned clear broth or consume(fat free) Sugar, honey syrup  Sample Menu Breakfast                                Lunch                                     Supper Cranberry juice                    Beef broth                            Chicken broth Jell-O                                     Grape juice                           Apple juice Coffee or tea                        Jell-O                                      Popsicle                                                Coffee or tea                        Coffee or tea  _____________________________________________________________________              How to Manage Your Diabetes Before and After Surgery  Why is it important to control my blood sugar before and after surgery? . Improving blood sugar levels before and after surgery helps healing and can limit problems. . A way of improving blood sugar control is eating a healthy diet by: o  Eating less sugar and carbohydrates o  Increasing activity/exercise o  Talking with your doctor about reaching your blood sugar goals . High blood sugars (greater than 180 mg/dL) can raise your risk of infections and slow your recovery, so you will need to focus on controlling your diabetes during the weeks before surgery. . Make sure that the doctor who takes care of your diabetes knows about your planned surgery including the date and location.  How do I manage my blood sugar before surgery? . Check your blood sugar at least 4 times a day, starting 2 days before surgery, to make sure that the level is not too high or low. o Check your blood sugar the morning of your surgery when you wake up and every 2 hours until you get to  the Short Stay unit. . If your blood sugar is less than 70 mg/dL, you will need to treat for low blood sugar: o Do not take insulin. o Treat a low blood  sugar (less than 70 mg/dL) with  cup of clear juice (cranberry or apple), 4 glucose tablets, OR glucose gel. o Recheck blood sugar in 15 minutes after treatment (to make sure it is greater than 70 mg/dL). If your blood sugar is not greater than 70 mg/dL on recheck, call [phone] for further instructions. . Report your blood sugar to the short stay nurse when you get to Short Stay.  . If you are admitted to the hospital after surgery: o Your blood sugar will be checked by the staff and you will probably be given insulin after surgery (instead of oral diabetes medicines) to make sure you have good blood sugar levels. o The goal for blood sugar control after surgery is 80-180 mg/dL.   WHAT DO I DO ABOUT MY DIABETES MEDICATION?   . THE DAY BEFORE SURGERY,  o take METFORMIN  As usual  o CONTACT YOUR MEDICAL PROVIDER FOR INSTRUCTIONS ABOUT NOVOLIN 70/30 INSULIN !       . THE MORNING OF SURGERY, o Do not take METFORMIN o DO NOT TAKE [place] 70/30 INSULIN    Patient Signature:  Date:   Nurse Signature:  Date:   Reviewed and Endorsed by [facility] Patient Education Committee, August 2015  [facility] - Preparing for Surgery Before surgery, you can play an important role.  Because skin is not sterile, your skin needs to be as free of germs as possible.  You can reduce the number of germs on your skin by washing with CHG (chlorahexidine gluconate) soap before surgery.  CHG is an antiseptic cleaner which kills germs and bonds with the skin to continue killing germs even after washing. Please DO NOT use if you have an allergy to CHG or antibacterial soaps.  If your skin becomes reddened/irritated stop using the CHG and inform your nurse when you arrive at Short Stay. Do not shave (including legs and underarms) for at least 48 hours prior to the first CHG shower.  You may shave your face/neck. Please follow these instructions carefully:  1.  Shower with CHG Soap the night before surgery and  the  morning of Surgery.  2.  If you choose to wash your hair, wash your hair first as usual with your  normal  shampoo.  3.  After you shampoo, rinse your hair and body thoroughly to remove the  shampoo.                           4.  Use CHG as you would any other liquid soap.  You can apply chg directly  to the skin and wash                       Gently with a scrungie or clean washcloth.  5.  Apply the CHG Soap to your body ONLY FROM THE NECK DOWN.   Do not use on face/ open                           Wound or open sores. Avoid contact with eyes, ears mouth and genitals (private parts).  Wash face,  Genitals (private parts) with your normal soap.             6.  Wash thoroughly, paying special attention to the area where your surgery  will be performed.  7.  Thoroughly rinse your body with warm water from the neck down.  8.  DO NOT shower/wash with your normal soap after using and rinsing off  the CHG Soap.                9.  Pat yourself dry with a clean towel.            10.  Wear clean pajamas.            11.  Place clean sheets on your bed the night of your first shower and do not  sleep with pets. Day of Surgery : Do not apply any lotions/deodorants the morning of surgery.  Please wear clean clothes to the hospital/surgery center.  FAILURE TO FOLLOW THESE INSTRUCTIONS MAY RESULT IN THE CANCELLATION OF YOUR SURGERY PATIENT SIGNATURE_________________________________  NURSE SIGNATURE__________________________________  ________________________________________________________________________

## 2017-05-06 ENCOUNTER — Encounter (HOSPITAL_COMMUNITY)
Admission: RE | Admit: 2017-05-06 | Discharge: 2017-05-06 | Disposition: A | Discharge status: Home / Self Care | Payer: Medicare Other | Source: Ambulatory Visit | Attending: Urology | Admitting: Urology

## 2017-05-06 ENCOUNTER — Other Ambulatory Visit: Payer: Self-pay

## 2017-05-06 ENCOUNTER — Encounter (HOSPITAL_COMMUNITY): Payer: Self-pay

## 2017-05-06 DIAGNOSIS — N289 Disorder of kidney and ureter, unspecified: Secondary | ICD-10-CM | POA: Insufficient documentation

## 2017-05-06 DIAGNOSIS — Z01812 Encounter for preprocedural laboratory examination: Secondary | ICD-10-CM | POA: Diagnosis not present

## 2017-05-06 LAB — CBC
HEMATOCRIT: 45.4 % (ref 36.0–46.0)
HEMOGLOBIN: 15.5 g/dL — AB (ref 12.0–15.0)
MCH: 30.5 pg (ref 26.0–34.0)
MCHC: 34.1 g/dL (ref 30.0–36.0)
MCV: 89.2 fL (ref 78.0–100.0)
Platelets: 241 10*3/uL (ref 150–400)
RBC: 5.09 MIL/uL (ref 3.87–5.11)
RDW: 12.7 % (ref 11.5–15.5)
WBC: 9.9 10*3/uL (ref 4.0–10.5)

## 2017-05-06 LAB — BASIC METABOLIC PANEL
Anion gap: 8 (ref 5–15)
BUN: 18 mg/dL (ref 6–20)
CHLORIDE: 101 mmol/L (ref 101–111)
CO2: 32 mmol/L (ref 22–32)
CREATININE: 1.01 mg/dL — AB (ref 0.44–1.00)
Calcium: 10 mg/dL (ref 8.9–10.3)
GFR calc Af Amer: >60 mL/min (ref >60)
GFR calc non Af Amer: 55 mL/min — ABNORMAL LOW (ref >60)
GLUCOSE: 109 mg/dL — AB (ref 65–99)
POTASSIUM: 4.7 mmol/L (ref 3.5–5.1)
Sodium: 141 mmol/L (ref 135–145)

## 2017-05-06 LAB — GLUCOSE, CAPILLARY: Glucose-Capillary: 109 mg/dL — ABNORMAL HIGH (ref 65–99)

## 2017-05-06 LAB — ABO/RH: ABO/RH(D): O POS

## 2017-05-07 ENCOUNTER — Encounter: Payer: Self-pay | Admitting: Adult Health

## 2017-05-07 ENCOUNTER — Encounter: Payer: Self-pay | Admitting: *Deleted

## 2017-05-07 ENCOUNTER — Ambulatory Visit (INDEPENDENT_AMBULATORY_CARE_PROVIDER_SITE_OTHER): Payer: Medicare Other | Admitting: Adult Health

## 2017-05-07 VITALS — BP 128/74 | HR 82 | Ht 66.0 in | Wt 183.0 lb

## 2017-05-07 DIAGNOSIS — I447 Left bundle-branch block, unspecified: Secondary | ICD-10-CM | POA: Diagnosis not present

## 2017-05-07 DIAGNOSIS — Z01818 Encounter for other preprocedural examination: Secondary | ICD-10-CM | POA: Diagnosis not present

## 2017-05-07 DIAGNOSIS — I10 Essential (primary) hypertension: Secondary | ICD-10-CM | POA: Diagnosis not present

## 2017-05-07 DIAGNOSIS — C641 Malignant neoplasm of right kidney, except renal pelvis: Secondary | ICD-10-CM | POA: Diagnosis not present

## 2017-05-07 NOTE — Patient Instructions (Signed)
Medication Instructions:  Your physician recommends that you continue on your current medications as directed. Please refer to the Current Medication list given to you today.   Labwork: NONE   Testing/Procedures: Your physician has requested that you have an echocardiogram. Echocardiography is a painless test that uses sound waves to create images of your heart. It provides your doctor with information about the size and shape of your heart and how well your heart's chambers and valves are working. This procedure takes approximately one hour. There are no restrictions for this procedure.  Your physician has requested that you have an echocardiogram. Echocardiography is a painless test that uses sound waves to create images of your heart. It provides your doctor with information about the size and shape of your heart and how well your heart's chambers and valves are working. This procedure takes approximately one hour. There are no restrictions for this procedure.      Follow-Up: Your physician recommends that you schedule a follow-up appointment after test.    Any Other Special Instructions Will Be Listed Below (If Applicable).     If you need a refill on your cardiac medications before your next appointment, please call your pharmacy. Thank you for choosing [facility]!

## 2017-05-07 NOTE — Progress Notes (Signed)
Cardiology Office Note   Date:  05/07/2017   ID:  Ghadeer, Kastelic 1946/04/25, MRN [id number]  PCP:  Caryl Bis, MD  Cardiologist:  Satira Sark MD Chief Complaint  Patient presents with  . Pre-op Exam     History of Present Illness: Angela Harris is a 71 y.o. female who presents for ischemic heart disease, chronic left bundle branch block, essential hypertension, hyperlipidemia, with other history to include type 2 diabetes, GERD, chronic kidney disease stage III, and depression.  The patient was last seen by Dr. Domenic Polite on 02/19/2016.  Echocardiogram was ordered to evaluate LV systolic function.  The patient was also given a preoperative evaluation prior to right wrist repair following a fracture.  Patient is being seen today as he had an abnormal EKG at [facility].  He was diagnosed with diverticulitis.  However, during MRI, she was found to have a one-point centimeter lesion along the left upper kidney, with enhancing rim compatible with cystic renal cell carcinoma.  No findings suspicious for metastatic disease.   Anesthesia reviewed EKG prior to planned surgery.  EKG copy has been sent to [place] which revealed ongoing left bundle branch block but changes in lead II with evidence of inferior infarction nonspecific abnormalities.  She states she has been symptomatic feeling more fatigued, stamina is down, her breathing status is unchanged but she is not very active now.  She lost her mother in 2016, and has had one illness after another since that time.  She states that she is worn out from all of it.  She attributes some of her lack of energy to minor depression and frequent illnesses over the last 2 years.  Past Medical History:  Diagnosis Date  . Anemia   . CKD (chronic kidney disease) stage 3, GFR 30-59 ml/min (HCC)   . Depression   . Dysrhythmia    LBBB  . Essential hypertension   . GERD (gastroesophageal reflux disease)   . History of  TIA (transient ischemic attack) 2015  . Hyperlipidemia   . Meniere disease    pt denies  . Peripheral neuropathy   . PONV (postoperative nausea and vomiting)   . Stroke [facility]) [address]15   "mini-stroke". No deficits from this  . Type 2 diabetes mellitus ([place])     Past Surgical History:  Procedure Laterality Date  . ABDOMINAL HYSTERECTOMY    . BACK SURGERY     ruptured disc lower back  . CARPAL TUNNEL RELEASE Right 08/20/2016   Procedure: CARPAL TUNNEL RELEASE;  Surgeon: Carole Civil, MD;  Location: [facility];  Service: Orthopedics;  Laterality: Right;  . CATARACT EXTRACTION, BILATERAL    . CESAREAN SECTION    . CHOLECYSTECTOMY    . Closed reduction right wrist fracture Right   . ECTOPIC PREGNANCY SURGERY    . ORIF WRIST FRACTURE Right 02/26/2016   Procedure: OPEN REDUCTION INTERNAL FIXATION (ORIF) WRIST FRACTURE;  Surgeon: Carole Civil, MD;  Location: [facility];  Service: Orthopedics;  Laterality: Right;  TIME 12:30  . TONSILLECTOMY       Current Outpatient Medications  Medication Sig Dispense Refill  . acetaminophen (TYLENOL) 500 MG tablet Take 1,000 mg by mouth every 6 (six) hours as needed for mild pain.    Marland Kitchen amLODipine (NORVASC) 10 MG tablet Take 10 mg by mouth daily.    Marland Kitchen atenolol (TENORMIN) 50 MG tablet Take 50 mg by mouth daily.    Marland Kitchen FERREX 150 150 MG capsule Take  150 mg by mouth 2 (two) times daily.     Marland Kitchen gabapentin (NEURONTIN) 300 MG capsule Take 300 mg by mouth 2 (two) times daily.    . insulin NPH-regular Human (NOVOLIN 70/30) (70-30) 100 UNIT/ML injection Inject 35-40 Units into the skin 2 (two) times daily with a meal. Per sliding scale    . lisinopril-hydrochlorothiazide (PRINZIDE,ZESTORETIC) 20-12.5 MG tablet Take 2 tablets by mouth daily.     . metFORMIN (GLUCOPHAGE-XR) 500 MG 24 hr tablet Take 500 mg by mouth 2 (two) times daily.     . ONE TOUCH ULTRA TEST test strip USE ONE STRIP TO CHECK GLUCOSE 4 TIMES DAILY 150 each 5  . pantoprazole (PROTONIX) 40 MG tablet  Take 40 mg by mouth 2 (two) times daily.     . simvastatin (ZOCOR) 40 MG tablet Take 40 mg by mouth at bedtime.     Marland Kitchen venlafaxine XR (EFFEXOR-XR) 75 MG 24 hr capsule Take 75 mg by mouth daily with breakfast.    . aspirin EC 81 MG tablet Take 81 mg by mouth daily.    . Calcium Carb-Cholecalciferol (CALCIUM 600 + D PO) Take 1 tablet by mouth 2 (two) times daily.     No current facility-administered medications for this visit.     Allergies:   Patient has no known allergies.    Social History:  The patient  reports that  has never smoked. she has never used smokeless tobacco. She reports that she does not drink alcohol or use drugs.   Family History:  The patient's family history includes Alcoholism in her brother, father, and sister; Alzheimer's disease in her mother; Hypertension in her brother, father, mother, and sister; Thyroid disease in her mother.    ROS: All other systems are reviewed and negative. Unless otherwise mentioned in H&P    PHYSICAL EXAM: VS:  BP 128/74 (BP Location: Right Arm)   Pulse 82   Ht 5\' 6"  (1.676 m)   Wt 183 lb (83 kg)   SpO2 95%   BMI 29.54 kg/m  , BMI Body mass index is 29.54 kg/m. GEN: Well nourished, well developed, in no acute distress  HEENT: normal  Neck: no JVD, carotid bruits, or masses Cardiac: RRR; no murmurs, rubs, or gallops,no edema  Respiratory:  Clear to auscultation bilaterally, normal work of breathing GI: soft, nontender, nondistended, + BS MS: no deformity or atrophy  Skin: warm and dry, no rash Neuro:  Strength and sensation are intact Psych: euthymic mood, full affect   EKG:  The ekg ordered today demonstrates sinus rhythm, left bundle branch block, inferior Q waves with nonspecific lateral changes in V6.  Heart rate of 71 bpm.   Recent Labs: 05/06/2017: BUN 18; Creatinine, Ser 1.01; Hemoglobin 15.5; Platelets 241; Potassium 4.7; Sodium 141    Lipid Panel    Component Value Date/Time   CHOL 119 12/30/2015   TRIG 108  12/30/2015   HDL 50 12/30/2015   LDLCALC 47 12/30/2015      Wt Readings from Last 3 Encounters:  05/07/17 183 lb (83 kg)  05/06/17 183 lb (83 kg)  08/20/16 182 lb (82.6 kg)      Other studies Reviewed: Echocardiogram 04-19-16 Left ventricle: The cavity size was normal. Wall thickness was   increased in a pattern of mild LVH. Systolic function was normal.   The estimated ejection fraction was in the range of 55% to 60%.   Wall motion was normal; there were no regional wall motion   abnormalities.  Doppler parameters are consistent with abnormal   left ventricular relaxation (grade 1 diastolic dysfunction). - Aortic valve: Mildly calcified annulus. Trileaflet; mildly   thickened leaflets. Valve area (VTI): 1.81 cm^2. Valve area   (Vmax): 1.79 cm^2. Valve area (Vmean): 1.88 cm^2. - Left atrium: The atrium was mildly dilated. - Technically adequate study.  Nuclear medicine stress test (transcribed from scanned document on 01/09/2016)  Impression:  1.  No reversible ischemia or infarction 2.  Mild distal septal hypokinesis 3.  Ventricular ejection fraction 62% 4.  Low risk study  ASSESSMENT AND PLAN:  1.  Abnormal EKG: Patient has a chronic left bundle branch block, but there are some changes inferior in lead II, and V6.  Cardiovascular risk factors include Hypertension, type 2 diabetes, age.  We will repeat stress test using Lexiscan with nuclear medicine imaging.  Also repeat echocardiogram for changes in LV function with her complaints of fatigue and mild shortness of breath.  Once tests are completed we will send information to her primary care physician Dr. Kern Alberta and to Dr. Tresa Moore, [facility] in [facility] who are planned to complete surgery  2.  Left upper kidney carcinoma: Found on MRI at [facility] dated 03/15/2017.  Resection when cleared by cardiology.  3.  History of hypertension: Blood pressure is very well controlled  currently.  We will not make any changes in medications at this time.  Will remain on atenolol 50 mg daily.  4.  Chronic Kidney Disease Stage III; creatinine 0.96 per labs from recent hospitalization on 03/15/2017.  BUN 23.  Sodium 136, potassium 3.5, chloride 95.  CO2 27.  5.  History of anemia: Review of labs from recent hospitalization revealed a hemoglobin 15.8 and hematocrit 45.9, white blood cells were elevated at that time at 16.9 platelets 283.  Current medicines are reviewed at length with the patient today.    Labs/ tests ordered today include: Echocardiogram, Lexiscan Myoview  Phill Myron. West Pugh, ANP, AACC   05/07/2017 1:25 PM    [facility] [address] [place], [place] [address] Phone: [phone]; Fax: [phone]

## 2017-05-07 NOTE — Progress Notes (Signed)
Requested EKG from [facility] received. Due to abnormalities in EKG, EKG showed to Anesthesia Dr Annye Asa along with last 2 previous EKGs, Stress Test, and ECHO records. Per Glennon Mac, due to changes in most recent EKG since Stress that was test done over a year ago ; recommending patient undergo eval by cardiology before proceeding with surgery.   RN LVMM with Selita at [facility] to make aware. RN will continue to F/U.

## 2017-05-07 NOTE — Progress Notes (Signed)
Requested EKG from [facility] received. Due to abnormalities in EKG, EKG showed to Anesthesia Dr Annye Asa along with last 2 previous EKGs, Stress Test, and ECHO records. Per Glennon Mac, due to changes in most recent EKG since Stress that was test done over a year ago ; recommending patient undergo eval by cardiology before proceeding with surgery.   RN LVMM with Angela Harris at [facility] to make aware. RN will continue to F/U.   Addendum 1130 05-07-17  received call back from Darrel Reach concerning Angela Harris. Reiterated to Angela Harris that anesthesia was requiring cardiac eval prior to surgery due to changes in EKG. Angela Harris verbalized understanding and states concern of limited tme frame.   Addendum 05-07-17 1145  Received call again form Angela Harris stating  she had arranged a 1:00pm appt with a nurse practitioner at [facility] for today for cardiac clearance and agreed that this RN would send EKGs, Stress test, ECHO, and ED note to fax  number provided by Angela Harris for [facility] office. Please see note in epic for cardiology visit notes .

## 2017-05-10 DIAGNOSIS — J09X3 Influenza due to identified novel influenza A virus with gastrointestinal manifestations: Secondary | ICD-10-CM | POA: Diagnosis not present

## 2017-05-10 DIAGNOSIS — Z6829 Body mass index (BMI) 29.0-29.9, adult: Secondary | ICD-10-CM | POA: Diagnosis not present

## 2017-05-10 DIAGNOSIS — R509 Fever, unspecified: Secondary | ICD-10-CM | POA: Diagnosis not present

## 2017-05-12 ENCOUNTER — Inpatient Hospital Stay (HOSPITAL_COMMUNITY): Admission: RE | Admit: 2017-05-12 | Payer: Medicare Other | Source: Ambulatory Visit | Admitting: Urology

## 2017-05-12 ENCOUNTER — Encounter (HOSPITAL_COMMUNITY): Admission: RE | Payer: Self-pay | Source: Ambulatory Visit

## 2017-05-12 LAB — TYPE AND SCREEN
ABO/RH(D): O POS
Antibody Screen: NEGATIVE

## 2017-05-12 SURGERY — NEPHRECTOMY, PARTIAL, ROBOT-ASSISTED
Anesthesia: General | Laterality: Left

## 2017-05-18 ENCOUNTER — Telehealth: Payer: Self-pay | Admitting: *Deleted

## 2017-05-18 ENCOUNTER — Ambulatory Visit (HOSPITAL_BASED_OUTPATIENT_CLINIC_OR_DEPARTMENT_OTHER)
Admission: RE | Admit: 2017-05-18 | Discharge: 2017-05-18 | Disposition: A | Discharge status: Home / Self Care | Payer: Medicare Other | Source: Ambulatory Visit | Attending: Adult Health | Admitting: Adult Health

## 2017-05-18 ENCOUNTER — Encounter (HOSPITAL_COMMUNITY)
Admission: RE | Admit: 2017-05-18 | Discharge: 2017-05-18 | Disposition: A | Discharge status: Home / Self Care | Payer: Medicare Other | Source: Ambulatory Visit | Attending: Adult Health | Admitting: Adult Health

## 2017-05-18 ENCOUNTER — Encounter (HOSPITAL_BASED_OUTPATIENT_CLINIC_OR_DEPARTMENT_OTHER)
Admission: RE | Admit: 2017-05-18 | Discharge: 2017-05-18 | Disposition: A | Discharge status: Home / Self Care | Payer: Medicare Other | Source: Ambulatory Visit | Attending: Adult Health | Admitting: Adult Health

## 2017-05-18 DIAGNOSIS — I10 Essential (primary) hypertension: Secondary | ICD-10-CM | POA: Diagnosis not present

## 2017-05-18 DIAGNOSIS — Z01810 Encounter for preprocedural cardiovascular examination: Secondary | ICD-10-CM | POA: Insufficient documentation

## 2017-05-18 DIAGNOSIS — Z01818 Encounter for other preprocedural examination: Secondary | ICD-10-CM | POA: Diagnosis not present

## 2017-05-18 DIAGNOSIS — E119 Type 2 diabetes mellitus without complications: Secondary | ICD-10-CM | POA: Diagnosis not present

## 2017-05-18 DIAGNOSIS — R9431 Abnormal electrocardiogram [ECG] [EKG]: Secondary | ICD-10-CM | POA: Insufficient documentation

## 2017-05-18 DIAGNOSIS — E785 Hyperlipidemia, unspecified: Secondary | ICD-10-CM | POA: Insufficient documentation

## 2017-05-18 LAB — ECHOCARDIOGRAM COMPLETE
CHL CUP MV DEC (S): 243
E decel time: 243 msec
EERAT: 10.88
FS: 39 % (ref 28–44)
IVS/LV PW RATIO, ED: 1.01
LA ID, A-P, ES: 38 mm
LA diam end sys: 38 mm
LA diam index: 1.91 cm/m2
LA vol index: 14.6 mL/m2
LA vol: 29.1 mL
LAVOLA4C: 24.4 mL
LDCA: 2.84 cm2
LV E/e'average: 10.88
LV PW d: 11.3 mm — AB (ref 0.6–1.1)
LV TDI E'MEDIAL: 4.35
LV dias vol: 52 mL (ref 46–106)
LV e' LATERAL: 6.74 cm/s
LV sys vol index: 9 mL/m2
LV sys vol: 18 mL
LVDIAVOLIN: 26 mL/m2
LVEEMED: 10.88
LVOT SV: 59 mL
LVOT VTI: 20.9 cm
LVOT diameter: 19 mm
LVOT peak grad rest: 3 mmHg
LVOT peak vel: 86.2 cm/s
Lateral S' vel: 12.4 cm/s
MV Peak grad: 2 mmHg
MV pk A vel: 90.8 m/s
MV pk E vel: 73.3 m/s
RV TAPSE: 17.9 mm
Simpson's disk: 66
Stroke v: 35 ml
TDI e' lateral: 6.74

## 2017-05-18 LAB — NM MYOCAR MULTI W/SPECT W/WALL MOTION / EF
CHL CUP RESTING HR STRESS: 65 {beats}/min
CSEPPHR: 91 {beats}/min
LV dias vol: 55 mL (ref 46–106)
LVSYSVOL: 17 mL
RATE: 0.38
SDS: 0
SRS: 0
SSS: 0
TID: 1.14

## 2017-05-18 MED ORDER — SODIUM CHLORIDE 0.9% FLUSH
INTRAVENOUS | Status: AC
  Administered 2017-05-18: 10 mL via INTRAVENOUS
  Filled 2017-05-18: qty 10

## 2017-05-18 MED ORDER — REGADENOSON 0.4 MG/5ML IV SOLN
INTRAVENOUS | Status: AC
  Administered 2017-05-18: 0.4 mg via INTRAVENOUS
  Filled 2017-05-18: qty 5

## 2017-05-18 MED ORDER — TECHNETIUM TC 99M TETROFOSMIN IV KIT
10.0000 | PACK | Freq: Once | INTRAVENOUS | Status: AC | PRN
  Administered 2017-05-18: 10 via INTRAVENOUS

## 2017-05-18 MED ORDER — TECHNETIUM TC 99M TETROFOSMIN IV KIT
30.0000 | PACK | Freq: Once | INTRAVENOUS | Status: AC | PRN
  Administered 2017-05-18: 30 via INTRAVENOUS

## 2017-05-18 NOTE — Telephone Encounter (Signed)
-----   Message from Lendon Colonel, NP sent at 05/18/2017 10:47 AM EST ----- Echocardiogram reviewed. Still have evidence of relaxation stiffening. Will need to keep BP under control. Please refer any further recommendations to Dr. Domenic Polite concerning this patient. Thank you!

## 2017-05-18 NOTE — Progress Notes (Signed)
*  PRELIMINARY RESULTS* Echocardiogram 2D Echocardiogram has been performed.  Angela Harris 05/18/2017, 9:21 AM

## 2017-05-18 NOTE — Telephone Encounter (Signed)
Returning call-- can be reached @ [phone]

## 2017-05-18 NOTE — Telephone Encounter (Signed)
Called patient with test results. No answer. Left message to call back.  

## 2017-05-19 NOTE — Telephone Encounter (Signed)
Pt notified of test results

## 2017-05-22 DIAGNOSIS — R197 Diarrhea, unspecified: Secondary | ICD-10-CM | POA: Diagnosis not present

## 2017-05-24 NOTE — Progress Notes (Deleted)
Cardiology Office Note    Date:  05/24/2017   ID:  Angela Harris, [name] 10-25-1945, MRN [id number]  PCP:  Caryl Bis, MD  Cardiologist: Dr. Domenic Polite  No chief complaint on file.   History of Present Illness:    Angela Harris is a 72 y.o. female with past medical history of chronic LBBB, HTN, HLD, IDDM, and Stage 3 CKD who presents to the office today for 3-week follow-up.   She was last examined by Jory Sims, DNP in 04/2017 for follow-up from a recent hospitalization at [facility] during which time she was diagnosed with renal cell carcinoma of the left kidney. An echocardiogram and NST were recommended for cardiac clearance. These were performed on 05/18/2017 and showed a preserved EF of 60% to 65%, Grade 1 DD, trivial AI, trivial TR, and no regional wall motion abnormalities. NST showed a small, mild intensity, basal inferoseptal defect that is most consistent with soft tissue attenuation and no significant ischemia, overall being a low-risk study.    Past Medical History:  Diagnosis Date  . Anemia   . CKD (chronic kidney disease) stage 3, GFR 30-59 ml/min (HCC)   . Depression   . Dysrhythmia    LBBB  . Essential hypertension   . GERD (gastroesophageal reflux disease)   . History of TIA (transient ischemic attack) 2015  . Hyperlipidemia   . Meniere disease    pt denies  . Peripheral neuropathy   . PONV (postoperative nausea and vomiting)   . Stroke [facility]) [address]   "mini-stroke". No deficits from this  . Type 2 diabetes mellitus ([place])     Past Surgical History:  Procedure Laterality Date  . ABDOMINAL HYSTERECTOMY    . BACK SURGERY     ruptured disc lower back  . CARPAL TUNNEL RELEASE Right 08/20/2016   Procedure: CARPAL TUNNEL RELEASE;  Surgeon: Carole Civil, MD;  Location: [facility];  Service: Orthopedics;  Laterality: Right;  . CATARACT EXTRACTION, BILATERAL    . CESAREAN SECTION    . CHOLECYSTECTOMY    . Closed reduction right wrist  fracture Right   . ECTOPIC PREGNANCY SURGERY    . ORIF WRIST FRACTURE Right 02/26/2016   Procedure: OPEN REDUCTION INTERNAL FIXATION (ORIF) WRIST FRACTURE;  Surgeon: Carole Civil, MD;  Location: [facility];  Service: Orthopedics;  Laterality: Right;  TIME 12:30  . TONSILLECTOMY      Current Medications: Outpatient Medications Prior to Visit  Medication Sig Dispense Refill  . acetaminophen (TYLENOL) 500 MG tablet Take 1,000 mg by mouth every 6 (six) hours as needed for mild pain.    Marland Kitchen amLODipine (NORVASC) 10 MG tablet Take 10 mg by mouth daily.    Marland Kitchen aspirin EC 81 MG tablet Take 81 mg by mouth daily.    Marland Kitchen atenolol (TENORMIN) 50 MG tablet Take 50 mg by mouth daily.    . Calcium Carb-Cholecalciferol (CALCIUM 600 + D PO) Take 1 tablet by mouth 2 (two) times daily.    Marland Kitchen FERREX 150 150 MG capsule Take 150 mg by mouth 2 (two) times daily.     Marland Kitchen gabapentin (NEURONTIN) 300 MG capsule Take 300 mg by mouth 2 (two) times daily.    . insulin NPH-regular Human (NOVOLIN 70/30) (70-30) 100 UNIT/ML injection Inject 35-40 Units into the skin 2 (two) times daily with a meal. Per sliding scale    . lisinopril-hydrochlorothiazide (PRINZIDE,ZESTORETIC) 20-12.5 MG tablet Take 2 tablets by mouth daily.     . metFORMIN (  GLUCOPHAGE-XR) 500 MG 24 hr tablet Take 500 mg by mouth 2 (two) times daily.     . ONE TOUCH ULTRA TEST test strip USE ONE STRIP TO CHECK GLUCOSE 4 TIMES DAILY 150 each 5  . pantoprazole (PROTONIX) 40 MG tablet Take 40 mg by mouth 2 (two) times daily.     . simvastatin (ZOCOR) 40 MG tablet Take 40 mg by mouth at bedtime.     Marland Kitchen venlafaxine XR (EFFEXOR-XR) 75 MG 24 hr capsule Take 75 mg by mouth daily with breakfast.     No facility-administered medications prior to visit.      Allergies:   Patient has no known allergies.   Social History   Socioeconomic History  . Marital status: Divorced    Spouse name: Not on file  . Number of children: Not on file  . Years of education: Not on file  .  Highest education level: Not on file  Social Needs  . Financial resource strain: Not on file  . Food insecurity - worry: Not on file  . Food insecurity - inability: Not on file  . Transportation needs - medical: Not on file  . Transportation needs - non-medical: Not on file  Occupational History  . Not on file  Tobacco Use  . Smoking status: Never Smoker  . Smokeless tobacco: Never Used  Substance and Sexual Activity  . Alcohol use: No    Alcohol/week: 0.0 oz  . Drug use: No  . Sexual activity: No    Birth control/protection: Surgical  Other Topics Concern  . Not on file  Social History Narrative  . Not on file     Family History:  The patient's ***family history includes Alcoholism in her brother, father, and sister; Alzheimer's disease in her mother; Hypertension in her brother, father, mother, and sister; Thyroid disease in her mother.   Review of Systems:   Please see the history of present illness.     General:  No chills, fever, night sweats or weight changes.  Cardiovascular:  No chest pain, dyspnea on exertion, edema, orthopnea, palpitations, paroxysmal nocturnal dyspnea. Dermatological: No rash, lesions/masses Respiratory: No cough, dyspnea Urologic: No hematuria, dysuria Abdominal:   No nausea, vomiting, diarrhea, bright red blood per rectum, melena, or hematemesis Neurologic:  No visual changes, wkns, changes in mental status. All other systems reviewed and are otherwise negative except as noted above.   Physical Exam:    VS:  There were no vitals taken for this visit.   General: Well developed, well nourished,female appearing in no acute distress. Head: Normocephalic, atraumatic, sclera non-icteric, no xanthomas, nares are without discharge.  Neck: No carotid bruits. JVD not elevated.  Lungs: Respirations regular and unlabored, without wheezes or rales.  Heart: ***Regular rate and rhythm. No S3 or S4.  No murmur, no rubs, or gallops appreciated. Abdomen:  Soft, non-tender, non-distended with normoactive bowel sounds. No hepatomegaly. No rebound/guarding. No obvious abdominal masses. Msk:  Strength and tone appear normal for age. No joint deformities or effusions. Extremities: No clubbing or cyanosis. No edema.  Distal pedal pulses are 2+ bilaterally. Neuro: Alert and oriented X 3. Moves all extremities spontaneously. No focal deficits noted. Psych:  Responds to questions appropriately with a normal affect. Skin: No rashes or lesions noted  Wt Readings from Last 3 Encounters:  05/07/17 183 lb (83 kg)  05/06/17 183 lb (83 kg)  08/20/16 182 lb (82.6 kg)        Studies/Labs Reviewed:   EKG:  EKG  is*** ordered today.  The ekg ordered today demonstrates ***  Recent Labs: 05/06/2017: BUN 18; Creatinine, Ser 1.01; Hemoglobin 15.5; Platelets 241; Potassium 4.7; Sodium 141   Lipid Panel    Component Value Date/Time   CHOL 119 12/30/2015   TRIG 108 12/30/2015   HDL 50 12/30/2015   LDLCALC 47 12/30/2015    Additional studies/ records that were reviewed today include:   Echocardiogram: 05/18/2017 Study Conclusions  - Left ventricle: The cavity size was normal. Wall thickness was   increased in a pattern of mild LVH. Systolic function was normal.   The estimated ejection fraction was in the range of 60% to 65%.   Wall motion was normal; there were no regional wall motion   abnormalities. Doppler parameters are consistent with abnormal   left ventricular relaxation (grade 1 diastolic dysfunction). - Mitral valve: There was trivial regurgitation. - Right atrium: Central venous pressure (est): 3 mm Hg. - Atrial septum: There was increased thickness of the septum,   consistent with lipomatous hypertrophy. No defect or patent   foramen ovale was identified. - Tricuspid valve: There was trivial regurgitation. - Pulmonary arteries: Systolic pressure could not be accurately   estimated. - Pericardium, extracardiac: A prominent pericardial  fat pad was   present.  Impressions:  - Mild LVH with LVEF 60-65% and grade 1 diastolic dysfunction.   Trivial mitral regurgitation. Trivial tricuspid regurgitation.  NST: 05/18/2017  No diagnostic ST segment changes or arrhythmias.  Small, mild intensity, basal inferoseptal defect that is most consistent with soft tissue attenuation. No significant ischemia.  This is a low risk study.  Nuclear stress EF: 70%.    Assessment:    No diagnosis found.   Plan:   In order of problems listed above:  1. ***    Medication Adjustments/Labs and Tests Ordered: Current medicines are reviewed at length with the patient today.  Concerns regarding medicines are outlined above.  Medication changes, Labs and Tests ordered today are listed in the Patient Instructions below. There are no Patient Instructions on file for this visit.   Signed, Erma Heritage, PA-C  05/24/2017 11:18 AM    [place] [address] [place], [place] [address] Phone: [phone]

## 2017-05-25 ENCOUNTER — Telehealth: Payer: Self-pay | Admitting: *Deleted

## 2017-05-25 ENCOUNTER — Encounter: Payer: Self-pay | Admitting: Student

## 2017-05-25 ENCOUNTER — Ambulatory Visit: Payer: Medicare Other | Admitting: Student

## 2017-05-25 NOTE — Telephone Encounter (Signed)
Cardiac clearance completed by Bernerd Pho, PA-C and faxed to [facility] @ [phone]

## 2017-05-27 ENCOUNTER — Other Ambulatory Visit: Payer: Self-pay | Admitting: Urology

## 2017-05-27 DIAGNOSIS — C642 Malignant neoplasm of left kidney, except renal pelvis: Secondary | ICD-10-CM | POA: Diagnosis not present

## 2017-06-14 NOTE — Patient Instructions (Addendum)
Angela Harris  06/14/2017   Your procedure is scheduled on: 06-18-17   Report to [facility] Main  Entrance Report to Admitting at 9:30 AM   Call this number if you have problems the morning of surgery [phone]   Remember: Do not eat food or drink liquids :After Midnight.    Please follow a Clear Liquid Diet the Day before Surgery     CLEAR LIQUID DIET   Foods Allowed                                                                     Foods Excluded  Coffee and tea, regular and decaf                             liquids that you cannot  Plain Jell-O in any flavor                                             see through such as: Fruit ices (not with fruit pulp)                                     milk, soups, orange juice  Iced Popsicles                                    All solid food Carbonated beverages, regular and diet                                    Cranberry, grape and apple juices Sports drinks like Gatorade Lightly seasoned clear broth or consume(fat free) Sugar, honey syrup  Sample Menu Breakfast                                Lunch                                     Supper Cranberry juice                    Beef broth                            Chicken broth Jell-O                                     Grape juice                           Apple juice Coffee or tea  Jell-O                                      Popsicle                                                Coffee or tea                        Coffee or tea  _____________________________________________________________________      Take these medicines the morning of surgery with A SIP OF WATER: Amlodipine (Norvasc), Atenolol (Tenormin), Gabapentin (Neurontin), Pantoprazole (Protonix), and Venlafaxine XR (Effexor)    DO NOT TAKE ANY DIABETIC MEDICATIONS DAY OF YOUR SURGERY                               You may not have any metal on your body including  hair pins and              piercings  Do not wear jewelry, make-up, lotions, powders or perfumes, deodorant             Do not wear nail polish.  Do not shave  48 hours prior to surgery.               Do not bring valuables to the hospital. [place].  Contacts, dentures or bridgework may not be worn into surgery.  Leave suitcase in the car. After surgery it may be brought to your room.     Patients discharged the day of surgery will not be allowed to drive home.  Special Instructions: Follow your instructions as provided by your surgeon              Please read over the following fact sheets you were given: _____________________________________________________________________ How to Manage Your Diabetes Before and After Surgery  Why is it important to control my blood sugar before and after surgery? . Improving blood sugar levels before and after surgery helps healing and can limit problems. . A way of improving blood sugar control is eating a healthy diet by: o  Eating less sugar and carbohydrates o  Increasing activity/exercise o  Talking with your doctor about reaching your blood sugar goals . High blood sugars (greater than 180 mg/dL) can raise your risk of infections and slow your recovery, so you will need to focus on controlling your diabetes during the weeks before surgery. . Make sure that the doctor who takes care of your diabetes knows about your planned surgery including the date and location.  How do I manage my blood sugar before surgery? . Check your blood sugar at least 4 times a day, starting 2 days before surgery, to make sure that the level is not too high or low. o Check your blood sugar the morning of your surgery when you wake up and every 2 hours until you get to the Short Stay unit. . If your blood sugar is less than 70 mg/dL, you will need to treat for low blood sugar: o Do not take insulin. o Treat a low blood  sugar (  less than 70 mg/dL) with  cup of clear juice (cranberry or apple), 4 glucose tablets, OR glucose gel. o Recheck blood sugar in 15 minutes after treatment (to make sure it is greater than 70 mg/dL). If your blood sugar is not greater than 70 mg/dL on recheck, call [phone] for further instructions. . Report your blood sugar to the short stay nurse when you get to Short Stay.  . If you are admitted to the hospital after surgery: o Your blood sugar will be checked by the staff and you will probably be given insulin after surgery (instead of oral diabetes medicines) to make sure you have good blood sugar levels. o The goal for blood sugar control after surgery is 80-180 mg/dL.   WHAT DO I DO ABOUT MY DIABETES MEDICATION?  Marland Kitchen Do not take oral diabetes medicines (pills) the morning of surgery.  . THE DAY BEFORE SURGERY, take your usual dose of Metformin. And only 1/2 of 70/30 sliding scale insulin (per your doctor's instruction).      . The day of surgery, do not take other diabetes injectables, including Byetta (exenatide), Bydureon (exenatide ER), Victoza (liraglutide), or Trulicity (dulaglutide).    Patient Signature:  Date:   Nurse Signature:  Date:   Reviewed and Endorsed by [facility] Patient Education Committee, August 2015           [facility] - Preparing for Surgery Before surgery, you can play an important role.  Because skin is not sterile, your skin needs to be as free of germs as possible.  You can reduce the number of germs on your skin by washing with CHG (chlorahexidine gluconate) soap before surgery.  CHG is an antiseptic cleaner which kills germs and bonds with the skin to continue killing germs even after washing. Please DO NOT use if you have an allergy to CHG or antibacterial soaps.  If your skin becomes reddened/irritated stop using the CHG and inform your nurse when you arrive at Short Stay. Do not shave (including legs and underarms) for at least 48 hours  prior to the first CHG shower.  You may shave your face/neck. Please follow these instructions carefully:  1.  Shower with CHG Soap the night before surgery and the  morning of Surgery.  2.  If you choose to wash your hair, wash your hair first as usual with your  normal  shampoo.  3.  After you shampoo, rinse your hair and body thoroughly to remove the  shampoo.                           4.  Use CHG as you would any other liquid soap.  You can apply chg directly  to the skin and wash                       Gently with a scrungie or clean washcloth.  5.  Apply the CHG Soap to your body ONLY FROM THE NECK DOWN.   Do not use on face/ open                           Wound or open sores. Avoid contact with eyes, ears mouth and genitals (private parts).                       Wash face,  Genitals (private parts) with your normal  soap.             6.  Wash thoroughly, paying special attention to the area where your surgery  will be performed.  7.  Thoroughly rinse your body with warm water from the neck down.  8.  DO NOT shower/wash with your normal soap after using and rinsing off  the CHG Soap.                9.  Pat yourself dry with a clean towel.            10.  Wear clean pajamas.            11.  Place clean sheets on your bed the night of your first shower and do not  sleep with pets. Day of Surgery : Do not apply any lotions/deodorants the morning of surgery.  Please wear clean clothes to the hospital/surgery center.  FAILURE TO FOLLOW THESE INSTRUCTIONS MAY RESULT IN THE CANCELLATION OF YOUR SURGERY PATIENT SIGNATURE_________________________________  NURSE SIGNATURE__________________________________  ________________________________________________________________________

## 2017-06-14 NOTE — Progress Notes (Signed)
05-18-17 (Epic) ECHO  03-15-17 (Epic) EKG, CXR

## 2017-06-15 ENCOUNTER — Encounter (HOSPITAL_COMMUNITY): Payer: Self-pay

## 2017-06-15 ENCOUNTER — Other Ambulatory Visit: Payer: Self-pay

## 2017-06-15 ENCOUNTER — Encounter (HOSPITAL_COMMUNITY)
Admission: RE | Admit: 2017-06-15 | Discharge: 2017-06-15 | Disposition: A | Discharge status: Home / Self Care | Payer: Medicare Other | Source: Ambulatory Visit | Attending: Urology | Admitting: Urology

## 2017-06-15 LAB — CBC
HCT: 43.1 % (ref 36.0–46.0)
Hemoglobin: 14.4 g/dL (ref 12.0–15.0)
MCH: 29.6 pg (ref 26.0–34.0)
MCHC: 33.4 g/dL (ref 30.0–36.0)
MCV: 88.7 fL (ref 78.0–100.0)
PLATELETS: 226 10*3/uL (ref 150–400)
RBC: 4.86 MIL/uL (ref 3.87–5.11)
RDW: 12.9 % (ref 11.5–15.5)
WBC: 9.6 10*3/uL (ref 4.0–10.5)

## 2017-06-15 LAB — BASIC METABOLIC PANEL
Anion gap: 9 (ref 5–15)
BUN: 21 mg/dL — AB (ref 6–20)
CHLORIDE: 100 mmol/L — AB (ref 101–111)
CO2: 28 mmol/L (ref 22–32)
CREATININE: 1.17 mg/dL — AB (ref 0.44–1.00)
Calcium: 9.1 mg/dL (ref 8.9–10.3)
GFR calc Af Amer: 54 mL/min — ABNORMAL LOW (ref >60)
GFR calc non Af Amer: 46 mL/min — ABNORMAL LOW (ref >60)
Glucose, Bld: 184 mg/dL — ABNORMAL HIGH (ref 65–99)
Potassium: 3.5 mmol/L (ref 3.5–5.1)
SODIUM: 137 mmol/L (ref 135–145)

## 2017-06-15 LAB — HEMOGLOBIN A1C
HEMOGLOBIN A1C: 7.1 % — AB (ref 4.8–5.6)
MEAN PLASMA GLUCOSE: 157.07 mg/dL

## 2017-06-15 LAB — GLUCOSE, CAPILLARY: Glucose-Capillary: 223 mg/dL — ABNORMAL HIGH (ref 65–99)

## 2017-06-15 NOTE — Progress Notes (Signed)
06-15-17 Voice message left for Angela Harris at [facility] to fax Cardiac Clearance as noted in 05-25-17 telephone encounter with [facility]. Marland KitchenMarland KitchenMarland KitchenAwaiting return call or fax

## 2017-06-16 NOTE — Progress Notes (Signed)
05-25-17 Cardiac clearance from [facility] on chart

## 2017-06-18 ENCOUNTER — Inpatient Hospital Stay (HOSPITAL_COMMUNITY): Payer: Medicare Other | Admitting: Certified Registered Nurse Anesthetist

## 2017-06-18 ENCOUNTER — Encounter (HOSPITAL_COMMUNITY): Payer: Self-pay | Admitting: *Deleted

## 2017-06-18 ENCOUNTER — Inpatient Hospital Stay (HOSPITAL_COMMUNITY)
Admission: RE | Admit: 2017-06-18 | Discharge: 2017-06-20 | DRG: 661 | Disposition: A | Discharge status: Home / Self Care | Payer: Medicare Other | Source: Ambulatory Visit | Attending: Urology | Admitting: Urology

## 2017-06-18 ENCOUNTER — Encounter (HOSPITAL_COMMUNITY)
Admission: RE | Disposition: A | Discharge status: Home / Self Care | Payer: Self-pay | Source: Ambulatory Visit | Attending: Urology

## 2017-06-18 ENCOUNTER — Other Ambulatory Visit: Payer: Self-pay

## 2017-06-18 DIAGNOSIS — Z794 Long term (current) use of insulin: Secondary | ICD-10-CM | POA: Diagnosis not present

## 2017-06-18 DIAGNOSIS — E1122 Type 2 diabetes mellitus with diabetic chronic kidney disease: Secondary | ICD-10-CM | POA: Diagnosis present

## 2017-06-18 DIAGNOSIS — K219 Gastro-esophageal reflux disease without esophagitis: Secondary | ICD-10-CM | POA: Diagnosis present

## 2017-06-18 DIAGNOSIS — E785 Hyperlipidemia, unspecified: Secondary | ICD-10-CM | POA: Diagnosis not present

## 2017-06-18 DIAGNOSIS — N2889 Other specified disorders of kidney and ureter: Principal | ICD-10-CM | POA: Diagnosis present

## 2017-06-18 DIAGNOSIS — I129 Hypertensive chronic kidney disease with stage 1 through stage 4 chronic kidney disease, or unspecified chronic kidney disease: Secondary | ICD-10-CM | POA: Diagnosis present

## 2017-06-18 DIAGNOSIS — Q272 Other congenital malformations of renal artery: Secondary | ICD-10-CM | POA: Diagnosis not present

## 2017-06-18 DIAGNOSIS — C642 Malignant neoplasm of left kidney, except renal pelvis: Secondary | ICD-10-CM | POA: Diagnosis not present

## 2017-06-18 DIAGNOSIS — Z8673 Personal history of transient ischemic attack (TIA), and cerebral infarction without residual deficits: Secondary | ICD-10-CM

## 2017-06-18 DIAGNOSIS — N183 Chronic kidney disease, stage 3 (moderate): Secondary | ICD-10-CM | POA: Diagnosis present

## 2017-06-18 DIAGNOSIS — E1142 Type 2 diabetes mellitus with diabetic polyneuropathy: Secondary | ICD-10-CM | POA: Diagnosis present

## 2017-06-18 DIAGNOSIS — F329 Major depressive disorder, single episode, unspecified: Secondary | ICD-10-CM | POA: Diagnosis present

## 2017-06-18 HISTORY — PX: ROBOTIC ASSITED PARTIAL NEPHRECTOMY: SHX6087

## 2017-06-18 LAB — TYPE AND SCREEN
ABO/RH(D): O POS
Antibody Screen: NEGATIVE

## 2017-06-18 LAB — GLUCOSE, CAPILLARY
GLUCOSE-CAPILLARY: 132 mg/dL — AB (ref 65–99)
GLUCOSE-CAPILLARY: 184 mg/dL — AB (ref 65–99)
GLUCOSE-CAPILLARY: 214 mg/dL — AB (ref 65–99)

## 2017-06-18 LAB — HEMOGLOBIN AND HEMATOCRIT, BLOOD
HEMATOCRIT: 39.4 % (ref 36.0–46.0)
HEMOGLOBIN: 13.3 g/dL (ref 12.0–15.0)

## 2017-06-18 SURGERY — NEPHRECTOMY, PARTIAL, ROBOT-ASSISTED
Anesthesia: General | Laterality: Left

## 2017-06-18 MED ORDER — OXYCODONE HCL 5 MG PO TABS
5.0000 mg | ORAL_TABLET | ORAL | Status: DC | PRN
  Administered 2017-06-19 – 2017-06-20 (×4): 5 mg via ORAL
  Filled 2017-06-18 (×4): qty 1

## 2017-06-18 MED ORDER — MIDAZOLAM HCL 2 MG/2ML IJ SOLN
INTRAMUSCULAR | Status: AC
  Filled 2017-06-18: qty 2

## 2017-06-18 MED ORDER — SODIUM CHLORIDE 0.9 % IJ SOLN
INTRAMUSCULAR | Status: AC
  Filled 2017-06-18: qty 20

## 2017-06-18 MED ORDER — ACETAMINOPHEN 500 MG PO TABS
1000.0000 mg | ORAL_TABLET | Freq: Four times a day (QID) | ORAL | Status: AC
  Administered 2017-06-18 – 2017-06-19 (×4): 1000 mg via ORAL
  Filled 2017-06-18 (×4): qty 2

## 2017-06-18 MED ORDER — ROCURONIUM BROMIDE 10 MG/ML (PF) SYRINGE
PREFILLED_SYRINGE | INTRAVENOUS | Status: AC
  Filled 2017-06-18: qty 5

## 2017-06-18 MED ORDER — CEFAZOLIN SODIUM-DEXTROSE 2-4 GM/100ML-% IV SOLN
2.0000 g | INTRAVENOUS | Status: AC
  Administered 2017-06-18: 2 g via INTRAVENOUS
  Filled 2017-06-18: qty 100

## 2017-06-18 MED ORDER — FENTANYL CITRATE (PF) 100 MCG/2ML IJ SOLN
INTRAMUSCULAR | Status: AC
  Administered 2017-06-18: 25 ug via INTRAVENOUS
  Filled 2017-06-18: qty 2

## 2017-06-18 MED ORDER — ATENOLOL 50 MG PO TABS
50.0000 mg | ORAL_TABLET | Freq: Every day | ORAL | Status: DC
  Administered 2017-06-19: 50 mg via ORAL
  Filled 2017-06-18: qty 1

## 2017-06-18 MED ORDER — LACTATED RINGERS IV SOLN
INTRAVENOUS | Status: DC
  Administered 2017-06-18 (×2): via INTRAVENOUS

## 2017-06-18 MED ORDER — VENLAFAXINE HCL ER 75 MG PO CP24
75.0000 mg | ORAL_CAPSULE | Freq: Every day | ORAL | Status: DC
  Administered 2017-06-19 – 2017-06-20 (×2): 75 mg via ORAL
  Filled 2017-06-18 (×2): qty 1

## 2017-06-18 MED ORDER — LACTATED RINGERS IR SOLN
Status: DC | PRN
  Administered 2017-06-18: 1

## 2017-06-18 MED ORDER — MEPERIDINE HCL 50 MG/ML IJ SOLN: 6.2500 mg | INTRAMUSCULAR | Status: DC | PRN

## 2017-06-18 MED ORDER — HYDROMORPHONE HCL 1 MG/ML IJ SOLN
0.5000 mg | INTRAMUSCULAR | Status: DC | PRN
  Administered 2017-06-18 – 2017-06-19 (×3): 1 mg via INTRAVENOUS
  Filled 2017-06-18 (×3): qty 1

## 2017-06-18 MED ORDER — METOCLOPRAMIDE HCL 5 MG/ML IJ SOLN: 10.0000 mg | Freq: Once | INTRAMUSCULAR | Status: DC | PRN

## 2017-06-18 MED ORDER — DIPHENHYDRAMINE HCL 50 MG/ML IJ SOLN: 12.5000 mg | Freq: Four times a day (QID) | INTRAMUSCULAR | Status: DC | PRN

## 2017-06-18 MED ORDER — LIDOCAINE 2% (20 MG/ML) 5 ML SYRINGE
INTRAMUSCULAR | Status: DC | PRN
  Administered 2017-06-18: 60 mg via INTRAVENOUS

## 2017-06-18 MED ORDER — PHENYLEPHRINE HCL 10 MG/ML IJ SOLN
INTRAMUSCULAR | Status: DC | PRN
  Administered 2017-06-18: 80 ug via INTRAVENOUS

## 2017-06-18 MED ORDER — FENTANYL CITRATE (PF) 100 MCG/2ML IJ SOLN
25.0000 ug | INTRAMUSCULAR | Status: DC | PRN
  Administered 2017-06-18 (×2): 25 ug via INTRAVENOUS

## 2017-06-18 MED ORDER — SODIUM CHLORIDE 0.9 % IV SOLN
INTRAVENOUS | Status: DC | PRN
  Administered 2017-06-18: 50 ug/min via INTRAVENOUS

## 2017-06-18 MED ORDER — SIMVASTATIN 40 MG PO TABS: 40.0000 mg | ORAL_TABLET | Freq: Every day | ORAL | Status: DC

## 2017-06-18 MED ORDER — ONDANSETRON HCL 4 MG/2ML IJ SOLN
INTRAMUSCULAR | Status: AC
  Filled 2017-06-18: qty 2

## 2017-06-18 MED ORDER — GABAPENTIN 300 MG PO CAPS
300.0000 mg | ORAL_CAPSULE | Freq: Two times a day (BID) | ORAL | Status: DC
  Administered 2017-06-18 – 2017-06-19 (×3): 300 mg via ORAL
  Filled 2017-06-18 (×3): qty 1

## 2017-06-18 MED ORDER — LACTATED RINGERS IV SOLN: INTRAVENOUS | Status: DC

## 2017-06-18 MED ORDER — FENTANYL CITRATE (PF) 250 MCG/5ML IJ SOLN
INTRAMUSCULAR | Status: AC
Start: 2017-06-18 — End: 2017-06-18
  Filled 2017-06-18: qty 5

## 2017-06-18 MED ORDER — SUCCINYLCHOLINE CHLORIDE 200 MG/10ML IV SOSY
PREFILLED_SYRINGE | INTRAVENOUS | Status: DC | PRN
  Administered 2017-06-18: 100 mg via INTRAVENOUS

## 2017-06-18 MED ORDER — ROCURONIUM BROMIDE 50 MG/5ML IV SOSY
PREFILLED_SYRINGE | INTRAVENOUS | Status: DC | PRN
  Administered 2017-06-18: 10 mg via INTRAVENOUS
  Administered 2017-06-18: 50 mg via INTRAVENOUS

## 2017-06-18 MED ORDER — MIDAZOLAM HCL 5 MG/5ML IJ SOLN
INTRAMUSCULAR | Status: DC | PRN
  Administered 2017-06-18 (×2): 1 mg via INTRAVENOUS

## 2017-06-18 MED ORDER — PROPOFOL 10 MG/ML IV BOLUS
INTRAVENOUS | Status: DC | PRN
  Administered 2017-06-18: 150 mg via INTRAVENOUS

## 2017-06-18 MED ORDER — SUCCINYLCHOLINE CHLORIDE 200 MG/10ML IV SOSY
PREFILLED_SYRINGE | INTRAVENOUS | Status: AC
  Filled 2017-06-18: qty 10

## 2017-06-18 MED ORDER — LIDOCAINE 2% (20 MG/ML) 5 ML SYRINGE
INTRAMUSCULAR | Status: AC
  Filled 2017-06-18: qty 5

## 2017-06-18 MED ORDER — EPHEDRINE SULFATE 50 MG/ML IJ SOLN
INTRAMUSCULAR | Status: DC | PRN
  Administered 2017-06-18 (×6): 10 mg via INTRAVENOUS

## 2017-06-18 MED ORDER — SODIUM CHLORIDE 0.45 % IV SOLN
INTRAVENOUS | Status: DC
  Administered 2017-06-18 – 2017-06-19 (×2): via INTRAVENOUS

## 2017-06-18 MED ORDER — ATORVASTATIN CALCIUM 20 MG PO TABS
20.0000 mg | ORAL_TABLET | Freq: Every day | ORAL | Status: DC
  Administered 2017-06-18 – 2017-06-19 (×2): 20 mg via ORAL
  Filled 2017-06-18 (×2): qty 1

## 2017-06-18 MED ORDER — DEXAMETHASONE SODIUM PHOSPHATE 10 MG/ML IJ SOLN
INTRAMUSCULAR | Status: DC | PRN
  Administered 2017-06-18: 10 mg via INTRAVENOUS

## 2017-06-18 MED ORDER — FENTANYL CITRATE (PF) 100 MCG/2ML IJ SOLN
INTRAMUSCULAR | Status: DC | PRN
  Administered 2017-06-18 (×4): 50 ug via INTRAVENOUS

## 2017-06-18 MED ORDER — MAGNESIUM CITRATE PO SOLN
1.0000 | Freq: Once | ORAL | Status: DC
  Filled 2017-06-18: qty 296

## 2017-06-18 MED ORDER — DEXAMETHASONE SODIUM PHOSPHATE 10 MG/ML IJ SOLN
INTRAMUSCULAR | Status: AC
  Filled 2017-06-18: qty 1

## 2017-06-18 MED ORDER — BUPIVACAINE LIPOSOME 1.3 % IJ SUSP
20.0000 mL | Freq: Once | INTRAMUSCULAR | Status: AC
  Administered 2017-06-18: 20 mL
  Filled 2017-06-18: qty 20

## 2017-06-18 MED ORDER — HYDROCODONE-ACETAMINOPHEN 5-325 MG PO TABS: 1.0000 | ORAL_TABLET | Freq: Four times a day (QID) | ORAL | 0 refills | Status: DC | PRN

## 2017-06-18 MED ORDER — ONDANSETRON HCL 4 MG/2ML IJ SOLN
INTRAMUSCULAR | Status: DC | PRN
  Administered 2017-06-18: 4 mg via INTRAVENOUS

## 2017-06-18 MED ORDER — PROPOFOL 10 MG/ML IV BOLUS
INTRAVENOUS | Status: AC
  Filled 2017-06-18: qty 20

## 2017-06-18 MED ORDER — PANTOPRAZOLE SODIUM 40 MG PO TBEC
40.0000 mg | DELAYED_RELEASE_TABLET | Freq: Two times a day (BID) | ORAL | Status: DC
Start: 2017-06-18 — End: 2017-06-20
  Administered 2017-06-18 – 2017-06-19 (×3): 40 mg via ORAL
  Filled 2017-06-18 (×3): qty 1

## 2017-06-18 MED ORDER — DIPHENHYDRAMINE HCL 12.5 MG/5ML PO ELIX: 12.5000 mg | ORAL_SOLUTION | Freq: Four times a day (QID) | ORAL | Status: DC | PRN

## 2017-06-18 MED ORDER — SUGAMMADEX SODIUM 200 MG/2ML IV SOLN
INTRAVENOUS | Status: DC | PRN
  Administered 2017-06-18: 200 mg via INTRAVENOUS

## 2017-06-18 MED ORDER — ONDANSETRON HCL 4 MG/2ML IJ SOLN: 4.0000 mg | INTRAMUSCULAR | Status: DC | PRN

## 2017-06-18 MED ORDER — SODIUM CHLORIDE 0.9 % IJ SOLN
INTRAMUSCULAR | Status: DC | PRN
  Administered 2017-06-18: 10 mL via INTRAVENOUS

## 2017-06-18 MED ORDER — AMLODIPINE BESYLATE 10 MG PO TABS
10.0000 mg | ORAL_TABLET | Freq: Every day | ORAL | Status: DC
  Administered 2017-06-19: 10 mg via ORAL
  Filled 2017-06-18: qty 1

## 2017-06-18 SURGICAL SUPPLY — 65 items
APPLICATOR SURGIFLO ENDO (HEMOSTASIS) ×3 IMPLANT
CHLORAPREP W/TINT 26ML (MISCELLANEOUS) ×3 IMPLANT
CLIP SUT LAPRA TY ABSORB (SUTURE) ×6 IMPLANT
CLIP VESOLOCK LG 6/CT PURPLE (CLIP) ×3 IMPLANT
CLIP VESOLOCK MED LG 6/CT (CLIP) ×12 IMPLANT
CLIP VESOLOCK XL 6/CT (CLIP) IMPLANT
COVER SURGICAL LIGHT HANDLE (MISCELLANEOUS) ×3 IMPLANT
COVER TIP SHEARS 8 DVNC (MISCELLANEOUS) ×1 IMPLANT
COVER TIP SHEARS 8MM DA VINCI (MISCELLANEOUS) ×2
DECANTER SPIKE VIAL GLASS SM (MISCELLANEOUS) ×3 IMPLANT
DERMABOND ADVANCED (GAUZE/BANDAGES/DRESSINGS) ×2
DERMABOND ADVANCED .7 DNX12 (GAUZE/BANDAGES/DRESSINGS) ×1 IMPLANT
DRAPE ARM DVNC X/XI (DISPOSABLE) ×4 IMPLANT
DRAPE COLUMN DVNC XI (DISPOSABLE) ×1 IMPLANT
DRAPE DA VINCI XI ARM (DISPOSABLE) ×8
DRAPE DA VINCI XI COLUMN (DISPOSABLE) ×2
DRAPE INCISE IOBAN 66X45 STRL (DRAPES) ×3 IMPLANT
DRAPE SHEET LG 3/4 BI-LAMINATE (DRAPES) ×3 IMPLANT
ELECT PENCIL ROCKER SW 15FT (MISCELLANEOUS) ×3 IMPLANT
ELECT REM PT RETURN 15FT ADLT (MISCELLANEOUS) ×3 IMPLANT
EVACUATOR SILICONE 100CC (DRAIN) ×3 IMPLANT
FLOSEAL 10ML (HEMOSTASIS) ×3 IMPLANT
GLOVE BIO SURGEON STRL SZ 6.5 (GLOVE) ×2 IMPLANT
GLOVE BIO SURGEONS STRL SZ 6.5 (GLOVE) ×1
GLOVE BIOGEL M STRL SZ7.5 (GLOVE) ×6 IMPLANT
GOWN STRL REUS W/TWL LRG LVL3 (GOWN DISPOSABLE) ×6 IMPLANT
HEMOSTAT SURGICEL 4X8 (HEMOSTASIS) ×3 IMPLANT
IRRIG SUCT STRYKERFLOW 2 WTIP (MISCELLANEOUS)
IRRIGATION SUCT STRKRFLW 2 WTP (MISCELLANEOUS) IMPLANT
KIT BASIN OR (CUSTOM PROCEDURE TRAY) ×3 IMPLANT
LOOP VESSEL MAXI BLUE (MISCELLANEOUS) ×3 IMPLANT
MARKER SKIN DUAL TIP RULER LAB (MISCELLANEOUS) ×3 IMPLANT
NEEDLE INSUFFLATION 14GA 120MM (NEEDLE) ×3 IMPLANT
NS IRRIG 1000ML POUR BTL (IV SOLUTION) ×3 IMPLANT
PORT ACCESS TROCAR AIRSEAL 12 (TROCAR) ×1 IMPLANT
PORT ACCESS TROCAR AIRSEAL 5M (TROCAR) ×2
POSITIONER SURGICAL ARM (MISCELLANEOUS) ×6 IMPLANT
POUCH SPECIMEN RETRIEVAL 10MM (ENDOMECHANICALS) ×3 IMPLANT
RELOAD STAPLER WHITE 60MM (STAPLE) IMPLANT
SEAL CANN UNIV 5-8 DVNC XI (MISCELLANEOUS) ×4 IMPLANT
SEAL XI 5MM-8MM UNIVERSAL (MISCELLANEOUS) ×8
SET TRI-LUMEN FLTR TB AIRSEAL (TUBING) ×3 IMPLANT
SOLUTION ELECTROLUBE (MISCELLANEOUS) ×3 IMPLANT
SPONGE LAP 4X18 X RAY DECT (DISPOSABLE) ×3 IMPLANT
STAPLE ECHEON FLEX 60 POW ENDO (STAPLE) IMPLANT
STAPLER RELOAD WHITE 60MM (STAPLE)
SURGIFLO W/THROMBIN 8M KIT (HEMOSTASIS) ×3 IMPLANT
SUT ETHILON 3 0 PS 1 (SUTURE) IMPLANT
SUT MNCRL AB 4-0 PS2 18 (SUTURE) ×6 IMPLANT
SUT PDS AB 1 CT1 27 (SUTURE) ×6 IMPLANT
SUT V-LOC BARB 180 2/0GR6 GS22 (SUTURE)
SUT VIC AB 0 CT1 27 (SUTURE) ×8
SUT VIC AB 0 CT1 27XBRD ANTBC (SUTURE) ×4 IMPLANT
SUT VIC AB 2-0 SH 27 (SUTURE) ×4
SUT VIC AB 2-0 SH 27X BRD (SUTURE) ×2 IMPLANT
SUT VLOC BARB 180 ABS3/0GR12 (SUTURE) ×3
SUTURE V-LC BRB 180 2/0GR6GS22 (SUTURE) IMPLANT
SUTURE VLOC BRB 180 ABS3/0GR12 (SUTURE) ×1 IMPLANT
TOWEL OR 17X26 10 PK STRL BLUE (TOWEL DISPOSABLE) ×6 IMPLANT
TOWEL OR NON WOVEN STRL DISP B (DISPOSABLE) ×3 IMPLANT
TRAY FOLEY W/METER SILVER 16FR (SET/KITS/TRAYS/PACK) ×3 IMPLANT
TRAY LAPAROSCOPIC (CUSTOM PROCEDURE TRAY) ×3 IMPLANT
TROCAR BLADELESS OPT 5 100 (ENDOMECHANICALS) IMPLANT
TROCAR XCEL 12X100 BLDLESS (ENDOMECHANICALS) ×3 IMPLANT
WATER STERILE IRR 1000ML POUR (IV SOLUTION) ×6 IMPLANT

## 2017-06-18 NOTE — Brief Op Note (Signed)
06/18/2017  2:19 PM  PATIENT:  Angela Harris  72 y.o. female  PRE-OPERATIVE DIAGNOSIS:  LEFT RENAL MASS  POST-OPERATIVE DIAGNOSIS:  LEFT RENAL MASS  PROCEDURE:  Procedure(s): XI ROBOTIC ASSITED PARTIAL NEPHRECTOMY (Left)  SURGEON:  Surgeon(s) and Role:    Alexis Frock, MD - Primary  PHYSICIAN ASSISTANT:   ASSISTANTS:  Clemetine Marker PA   ANESTHESIA:   local and general  EBL:  50 mL   BLOOD ADMINISTERED:none  DRAINS: 1 - JP to bulb, 2 - FOley to gravity   LOCAL MEDICATIONS USED:  MARCAINE     SPECIMEN:  Source of Specimen:  left partial nephrectomy  DISPOSITION OF SPECIMEN:  PATHOLOGY  COUNTS:  YES  TOURNIQUET:  * No tourniquets in log *  DICTATION: .Other Dictation: Dictation Number [id number]  PLAN OF CARE: Admit to inpatient   PATIENT DISPOSITION:  PACU - hemodynamically stable.   Delay start of Pharmacological VTE agent (>24hrs) due to surgical blood loss or risk of bleeding: yes

## 2017-06-18 NOTE — Transfer of Care (Signed)
Immediate Anesthesia Transfer of Care Note  Patient: Angela Harris  Procedure(s) Performed: XI ROBOTIC ASSITED PARTIAL NEPHRECTOMY (Left )  Patient Location: PACU  Anesthesia Type:General  Level of Consciousness: awake, alert  and oriented  Airway & Oxygen Therapy: Patient Spontanous Breathing and Patient connected to face mask oxygen  Post-op Assessment: Report given to RN and Post -op Vital signs reviewed and stable  Post vital signs: Reviewed and stable  Last Vitals:  Vitals:   06/18/17 0953 06/18/17 1440  BP: 132/66 (P) 127/60  Pulse: 64   Resp: 18 (P) 18  Temp: 36.9 C   SpO2: 98%     Last Pain:  Vitals:   06/18/17 0953  TempSrc: Oral         Complications: No apparent anesthesia complications

## 2017-06-18 NOTE — H&P (Signed)
Angela Harris is an 72 y.o. female.    Chief Complaint: Pre-op LEFT Partial Nephrectomy  HPI:   1 - Left Renal Mass - 1.7cm heterogenous 90% exophytic upper pole mass incidetnal on CT 03/2017 during admission for diverticulitis in [place]. Confirmed on dedicated MRI. Mass is solitary. 2 artery (1 lower pole accessory) / 1 vein (large lumbar inferior to main artery) left renovascular anatomy. Cr 1.16. CXR w/o masses.   PMH sig for IDDM2 (A1c 7s), Depression / SSRI, lap chole, ortho surgery . Her PCP is Gar Ponto MD with [facility] in [place].   Today " Angela Harris" is seen to proceed with LEFT robotic partial nephrectomy. She had interval cardiac clearance.    Past Medical History:  Diagnosis Date  . Anemia   . CKD (chronic kidney disease) stage 3, GFR 30-59 ml/min (HCC)   . Depression   . Dysrhythmia    LBBB  . Essential hypertension   . GERD (gastroesophageal reflux disease)   . History of TIA (transient ischemic attack) 2015  . Hyperlipidemia   . Meniere disease    pt denies  . Peripheral neuropathy   . PONV (postoperative nausea and vomiting)   . Stroke [facility]) [address]   "mini-stroke". No deficits from this  . Type 2 diabetes mellitus ([place])     Past Surgical History:  Procedure Laterality Date  . ABDOMINAL HYSTERECTOMY    . BACK SURGERY     ruptured disc lower back  . CARPAL TUNNEL RELEASE Right 08/20/2016   Procedure: CARPAL TUNNEL RELEASE;  Surgeon: Carole Civil, MD;  Location: [facility] ORS;  Service: Orthopedics;  Laterality: Right;  . CATARACT EXTRACTION, BILATERAL    . CESAREAN SECTION    . CHOLECYSTECTOMY    . Closed reduction right wrist fracture Right   . ECTOPIC PREGNANCY SURGERY    . ORIF WRIST FRACTURE Right 02/26/2016   Procedure: OPEN REDUCTION INTERNAL FIXATION (ORIF) WRIST FRACTURE;  Surgeon: Carole Civil, MD;  Location: [facility] ORS;  Service: Orthopedics;  Laterality: Right;  TIME 12:30  . TONSILLECTOMY      Family History  Problem Relation Age of Onset   . Alzheimer's disease Mother   . Thyroid disease Mother   . Hypertension Mother   . Alcoholism Father   . Hypertension Father   . Alcoholism Sister   . Hypertension Sister   . Alcoholism Brother   . Hypertension Brother    Social History:  reports that  has never smoked. she has never used smokeless tobacco. She reports that she does not drink alcohol or use drugs.  Allergies: No Known Allergies  No medications prior to admission.    No results found for this or any previous visit (from the past 48 hour(s)). No results found.  Review of Systems  Constitutional: Negative.   HENT: Negative.   Eyes: Negative.   Respiratory: Negative.   Cardiovascular: Negative.   Gastrointestinal: Negative.   Genitourinary: Negative.   Musculoskeletal: Negative.   Skin: Negative.   Neurological: Negative.   Endo/Heme/Allergies: Negative.   Psychiatric/Behavioral: Negative.     There were no vitals taken for this visit. Physical Exam  Constitutional: She appears well-developed.  HENT:  Head: Normocephalic.  Eyes: Pupils are equal, round, and reactive to light.  Neck: Normal range of motion.  Cardiovascular: Normal rate.  Respiratory: Effort normal.  GI: Soft.  Prior scars w/o hernias.   Musculoskeletal: Normal range of motion.  Neurological: She is alert.  Skin: Skin is warm.  Psychiatric: She  has a normal mood and affect.     Assessment/Plan  1 - Left Renal Mass - proceed as planned with LEFT robotic partial nephrectomy. Risks, benefits, alternatives, expected peri-op course discussed previously and reiterated today.      Alexis Frock, MD 06/18/2017, 8:51 AM

## 2017-06-18 NOTE — Anesthesia Preprocedure Evaluation (Signed)
Anesthesia Evaluation  Patient identified by MRN, date of birth, ID band Patient awake    Reviewed: Allergy & Precautions, NPO status , Patient's Chart, lab work & pertinent test results  History of Anesthesia Complications (+) PONV  Airway Mallampati: II  TM Distance: >3 FB Neck ROM: Full    Dental no notable dental hx.    Pulmonary neg pulmonary ROS,    Pulmonary exam normal breath sounds clear to auscultation       Cardiovascular hypertension, Pt. on medications Normal cardiovascular exam Rhythm:Regular Rate:Normal     Neuro/Psych TIAnegative psych ROS   GI/Hepatic negative GI ROS, Neg liver ROS,   Endo/Other  diabetes, Type 2, Oral Hypoglycemic Agents, Insulin Dependent  Renal/GU negative Renal ROS  negative genitourinary   Musculoskeletal negative musculoskeletal ROS (+)   Abdominal   Peds negative pediatric ROS (+)  Hematology negative hematology ROS (+)   Anesthesia Other Findings   Reproductive/Obstetrics negative OB ROS                             Anesthesia Physical Anesthesia Plan  ASA: III  Anesthesia Plan: General   Post-op Pain Management:    Induction: Intravenous  PONV Risk Score and Plan: 4 or greater and Ondansetron, Midazolam, Scopolamine patch - Pre-op and Treatment may vary due to age or medical condition  Airway Management Planned: Oral ETT  Additional Equipment:   Intra-op Plan:   Post-operative Plan: Extubation in OR  Informed Consent: I have reviewed the patients History and Physical, chart, labs and discussed the procedure including the risks, benefits and alternatives for the proposed anesthesia with the patient or authorized representative who has indicated his/her understanding and acceptance.   Dental advisory given  Plan Discussed with: CRNA  Anesthesia Plan Comments:         Anesthesia Quick Evaluation

## 2017-06-18 NOTE — Anesthesia Procedure Notes (Signed)
Procedure Name: Intubation Date/Time: 06/18/2017 12:17 PM Performed by: Maxwell Caul, CRNA Pre-anesthesia Checklist: Patient identified, Emergency Drugs available, Suction available and Patient being monitored Patient Re-evaluated:Patient Re-evaluated prior to induction Oxygen Delivery Method: Circle system utilized Preoxygenation: Pre-oxygenation with 100% oxygen Induction Type: IV induction Ventilation: Mask ventilation without difficulty Laryngoscope Size: Mac and 4 Grade View: Grade I Tube type: Oral Tube size: 7.0 mm Number of attempts: 1 Airway Equipment and Method: Stylet Placement Confirmation: ETT inserted through vocal cords under direct vision,  positive ETCO2 and breath sounds checked- equal and bilateral Secured at: 21 cm Tube secured with: Tape Dental Injury: Teeth and Oropharynx as per pre-operative assessment

## 2017-06-18 NOTE — Anesthesia Postprocedure Evaluation (Signed)
Anesthesia Post Note  Patient: Angela Harris  Procedure(s) Performed: XI ROBOTIC ASSITED PARTIAL NEPHRECTOMY (Left )     Patient location during evaluation: PACU Anesthesia Type: General Level of consciousness: awake and alert Pain management: pain level controlled Vital Signs Assessment: post-procedure vital signs reviewed and stable Respiratory status: spontaneous breathing, nonlabored ventilation, respiratory function stable and patient connected to nasal cannula oxygen Cardiovascular status: blood pressure returned to baseline and stable Postop Assessment: no apparent nausea or vomiting Anesthetic complications: no    Last Vitals:  Vitals:   06/18/17 1515 06/18/17 1530  BP:  (!) 127/57  Pulse: 77 75  Resp: 19 17  Temp:    SpO2: 99% 100%    Last Pain:  Vitals:   06/18/17 1515  TempSrc:   PainSc: Asleep                 Montez Hageman

## 2017-06-18 NOTE — Discharge Instructions (Signed)

## 2017-06-19 ENCOUNTER — Encounter (HOSPITAL_COMMUNITY): Payer: Self-pay | Admitting: Urology

## 2017-06-19 LAB — BASIC METABOLIC PANEL
Anion gap: 10 (ref 5–15)
BUN: 22 mg/dL — ABNORMAL HIGH (ref 6–20)
CO2: 26 mmol/L (ref 22–32)
Calcium: 8.5 mg/dL — ABNORMAL LOW (ref 8.9–10.3)
Chloride: 95 mmol/L — ABNORMAL LOW (ref 101–111)
Creatinine, Ser: 1.56 mg/dL — ABNORMAL HIGH (ref 0.44–1.00)
GFR calc Af Amer: 38 mL/min — ABNORMAL LOW (ref >60)
GFR calc non Af Amer: 33 mL/min — ABNORMAL LOW (ref >60)
Glucose, Bld: 279 mg/dL — ABNORMAL HIGH (ref 65–99)
Potassium: 4.1 mmol/L (ref 3.5–5.1)
Sodium: 131 mmol/L — ABNORMAL LOW (ref 135–145)

## 2017-06-19 LAB — HEMOGLOBIN AND HEMATOCRIT, BLOOD
HCT: 38.2 % (ref 36.0–46.0)
Hemoglobin: 13 g/dL (ref 12.0–15.0)

## 2017-06-19 LAB — GLUCOSE, CAPILLARY
GLUCOSE-CAPILLARY: 264 mg/dL — AB (ref 65–99)
GLUCOSE-CAPILLARY: 223 mg/dL — AB (ref 65–99)
GLUCOSE-CAPILLARY: 141 mg/dL — AB (ref 65–99)
Glucose-Capillary: 214 mg/dL — ABNORMAL HIGH (ref 65–99)

## 2017-06-19 MED ORDER — INSULIN ASPART 100 UNIT/ML ~~LOC~~ SOLN
0.0000 [IU] | Freq: Three times a day (TID) | SUBCUTANEOUS | Status: DC
  Administered 2017-06-19: 7 [IU] via SUBCUTANEOUS
  Administered 2017-06-19: 11 [IU] via SUBCUTANEOUS
  Administered 2017-06-20: 4 [IU] via SUBCUTANEOUS

## 2017-06-19 MED ORDER — INSULIN ASPART 100 UNIT/ML ~~LOC~~ SOLN
4.0000 [IU] | Freq: Three times a day (TID) | SUBCUTANEOUS | Status: DC
  Administered 2017-06-19 – 2017-06-20 (×3): 4 [IU] via SUBCUTANEOUS

## 2017-06-19 MED ORDER — INSULIN ASPART 100 UNIT/ML ~~LOC~~ SOLN
0.0000 [IU] | Freq: Every day | SUBCUTANEOUS | Status: DC
Start: 2017-06-19 — End: 2017-06-20

## 2017-06-19 NOTE — Progress Notes (Signed)
1 Day Post-Op Subjective: Patient reports she did have some pain in the flank last night.  It is somewhat better this morning.  She denies nausea, vomiting or abdominal discomfort.  Objective: Vital signs in last 24 hours: Temp:  [97.6 F (36.4 C)-98.5 F (36.9 C)] 97.7 F (36.5 C) (02/09 0419) Pulse Rate:  [64-84] 75 (02/09 0419) Resp:  [15-19] 18 (02/09 0419) BP: (94-135)/(52-68) 116/59 (02/09 0419) SpO2:  [90 %-100 %] 94 % (02/09 0419) Weight:  [83 kg (182 lb 15.7 oz)-83 kg (183 lb)] 83 kg (182 lb 15.7 oz) (02/08 1630)  Intake/Output from previous day: 02/08 0701 - 02/09 0700 In: 3165 [I.V.:3065; IV Piggyback:100] Out: 835 [Urine:675; Drains:110; Blood:50] Intake/Output this shift: No intake/output data recorded.  Physical Exam:  General:alert, cooperative and no distress GI: not done and soft, non tender, normal bowel sounds, no palpable masses, no organomegaly, no inguinal hernia Flank: No ecchymoses Port sites: No sign of infection or discharge  Lab Results: Recent Labs    06/18/17 1451 06/19/17 0447  HGB 13.3 13.0  HCT 39.4 38.2   BMET Recent Labs    06/19/17 0447  NA 131*  K 4.1  CL 95*  CO2 26  GLUCOSE 279*  BUN 22*  CREATININE 1.56*  CALCIUM 8.5*   No results for input(s): LABPT, INR in the last 72 hours. No results for input(s): LABURIN in the last 72 hours. No results found for this or any previous visit.  Studies/Results: No results found.  Assessment/Plan: Status post left partial nephrectomy: She is doing well.  She had minimal JP output and therefore this was removed.  Her urine output remains good.  Her urine is clear.  Her Foley catheter will therefore be removed.  Her creatinine bumped slightly from her baseline of 1.17-1.54 with a stable H&H of 13.0 and 38.2. We discussed discharge later today versus the morning and she would like to stay overnight.  DC JP  DC Foley  Advance to regular diet  Ambulate in hall   LOS: 1 day    Loeta Herst C 06/19/2017, 8:38 AM

## 2017-06-20 LAB — GLUCOSE, CAPILLARY: Glucose-Capillary: 160 mg/dL — ABNORMAL HIGH (ref 65–99)

## 2017-06-20 NOTE — Progress Notes (Signed)
Patient ready for D/C. Discharge paperwork given to patient and explained, printed prescription also given to patient. Patient is voiding with no difficulty, pain under control. Able to ambulate around room unassisted. Awaiting son's arrival at this time- will assist patient to exit via W/C with nursing assist.

## 2017-06-20 NOTE — Discharge Summary (Signed)
Physician Discharge Summary      Patient ID: Angela Harris MRN: [id number] DOB/AGE: December 20, 1945 72 y.o.  Admit date: 06/18/2017 Discharge date: 06/20/2017  Admission Diagnoses: LEFT RENAL MASS  Discharge Diagnoses:  Active Problems:   Renal mass   Discharged Condition: good  Hospital Course: She was admitted to the hospital in order to undergo a left partial nephrectomy on 06/18/17.  Her surgery proceeded without complication.  She did well postoperatively.  She was having minimal JP output and therefore that was removed.  Her Foley catheter was removed as well and she was still sore the day after her surgery and was observed for 24 hours longer.  She is feeling much better at this time.  She is tolerating regular diet.  Her soreness has decreased significantly.  She is therefore felt ready for discharge at this time.  Significant Diagnostic Studies: No results found.  Discharge Exam: Blood pressure (!) 119/54, pulse 87, temperature 100.1 F (37.8 C), resp. rate 20, height 5\' 4"  (1.626 m), weight 84.9 kg (187 lb 2.7 oz), SpO2 91 %.  General: Alert, oriented and in no distress. Chest: Normal respiratory effort. Cardiovascular: Regular rate and rhythm Abdomen: Soft and nontender.  Port sites without evidence of infection or discharge.   Disposition: 01-Home or Self Care  Discharge Instructions    Discharge patient   Complete by:  As directed    Discharge disposition:  01-Home or Self Care   Discharge patient date:  06/20/2017     Allergies as of 06/20/2017   No Known Allergies     Medication List    STOP taking these medications   aspirin EC 81 MG tablet   CALCIUM 600 + D PO   multivitamin with minerals Tabs tablet     TAKE these medications   acetaminophen 500 MG tablet Commonly known as:  TYLENOL Take 1,000 mg by mouth every 6 (six) hours as needed for mild pain.   amLODipine 10 MG tablet Commonly known as:  NORVASC Take 10 mg by mouth daily.   atenolol  50 MG tablet Commonly known as:  TENORMIN Take 50 mg by mouth daily.   FERREX 150 150 MG capsule Generic drug:  iron polysaccharides Take 150 mg by mouth 2 (two) times daily.   gabapentin 300 MG capsule Commonly known as:  NEURONTIN Take 300 mg by mouth 2 (two) times daily.   HYDROcodone-acetaminophen 5-325 MG tablet Commonly known as:  NORCO Take 1-2 tablets by mouth every 6 (six) hours as needed for moderate pain or severe pain.   insulin NPH-regular Human (70-30) 100 UNIT/ML injection Commonly known as:  NOVOLIN 70/30 Inject 35-40 Units into the skin 2 (two) times daily with a meal. Per sliding scale Under 100=0 units, over 100=38 units   lisinopril-hydrochlorothiazide 20-12.5 MG tablet Commonly known as:  PRINZIDE,ZESTORETIC Take 2 tablets by mouth daily.   metFORMIN 500 MG 24 hr tablet Commonly known as:  GLUCOPHAGE-XR Take 500 mg by mouth 2 (two) times daily.   ONE TOUCH ULTRA TEST test strip Generic drug:  glucose blood USE ONE STRIP TO CHECK GLUCOSE 4 TIMES DAILY   pantoprazole 40 MG tablet Commonly known as:  PROTONIX Take 40 mg by mouth 2 (two) times daily.   simvastatin 40 MG tablet Commonly known as:  ZOCOR Take 40 mg by mouth at bedtime.   venlafaxine XR 75 MG 24 hr capsule Commonly known as:  EFFEXOR-XR Take 75 mg by mouth daily with breakfast.  Follow-up Information    Alexis Frock, MD On 07/05/2017.   Specialty:  Urology Why:  at 1:30 for MD visit. Dr. Tresa Moore will call you with pathology results when available.  Contact information: [place] [address] [phone]           Signed: Claybon Jabs 06/20/2017, 6:07 AM

## 2017-06-21 NOTE — Op Note (Signed)
NAME:  Angela, Harris NO.:  MEDICAL RECORD NO.:  [id number]  LOCATION:                                 FACILITY:  PHYSICIAN:  Alexis Frock, MD          DATE OF BIRTH:  DATE OF PROCEDURE: 06/18/2017                               OPERATIVE REPORT   DIAGNOSIS:  Left renal mass.  PROCEDURES: 1. Robotic-assisted laparoscopic left partial nephrectomy. 2. Intraoperative ultrasound with interpretation.  ESTIMATED BLOOD LOSS:  50 cc.  COMPLICATION:  None.  SPECIMEN:  Left partial nephrectomy.  FINDINGS: 1. Two artery, one vein left renovascular anatomy as anticipated. 2. Approximately 80% exophytic superomedial solid renal mass by     intraoperative ultrasound.  DRAINS: 1. Jackson-Pratt drain to bulb suction. 2. Foley catheter to straight drain.  INDICATION:  Ms. [name] is a pleasant 72 year old lady with incidental left solid-enhancing renal mass on imaging for workup of diverticulitis, and diverticulosis was since resolved.  She was referred for consideration of management options.  Options including surveillance protocols versus ablative therapy versus surgical extirpation were discussed and she wished to proceed with partial nephrectomy.  Informed consent was obtained and placed in the medical record.  PROCEDURE IN DETAIL:  The patient being Angela Harris, was verified. Procedure being left robotic partial nephrectomy was confirmed. Procedure was carried out.  Time-out was performed.  Intravenous antibiotics were administered.  General endotracheal anesthesia introduced.  The patient was placed into a left side up, full flank position, employing 15 degrees of stable flexion, superior arm elevator, axillary roll, sequential compression devices, bottom leg bent, top leg straight.  She was further fashioned to the operating table using 3-inch tape over foam padding across her supraxiphoid chest and her pelvis. Sterile field was created by  prepping and draping the patient's entire left flank and abdomen using chlorhexidine gluconate.  A high-flow, low- pressure pneumoperitoneum was obtained using Veress technique in the left lower quadrant having passed the aspiration and drop test.  Next, an 8-mm robotic port was placed approximately 1 handbreadth superolateral to the umbilicus.  Laparoscopic examination of the peritoneal cavity revealed no significant adhesions and no visceral injury.  Additional ports were then placed as follows; left subcostal 8- mm robotic port, left far lateral 8-mm robotic port approximately 4 fingerbreadths superomedial to the anterior superior iliac spine; left paramedian inferior robotic port approximately 1 handbreadth superior to the pubic ramus, and two 12-mm assistant ports placed in the midline; one in the supraumbilical crease and another approximately 2 fingerbreadths above the camera port.  Robot was docked and passed through the electronic checks.  Initial attention was directed at development of the left retroperitoneum.  Incision was made lateral to the descending colon from the area of the splenic flexure towards the area of the internal ring, it was carefully swept medially.  There was substantial amount of omental, intraabdominal and retroperitoneal fat. Lateral splenic attachments were taken down allowing the spleen to rotate medially, and the anterior surface of Gerota's was released away from the posterior surface of the pancreas.  Lower pole of the kidney area was identified, placed on gentle lateral traction.  Dissection proceeded medial  to this.  The gonadal vein and ureter were encountered. The ureter was swept laterally.  Gonadal vein allowed to lie medially and dissection proceeded within this triangle towards the area of the renal hilum.  The renal hilum was somewhat complex and this was an inferior accessory renal artery, a conglomerate veins three of them as a renal vein  and an early-branching superior artery.  The superior artery complex was marked using a vessel loop as was the inferior pole accessory artery.  Next, attention was directed at identification of the mass.  Dissection proceeded down to the anterior surface of the kidney and was de-fatted superiorly.  The mass was encountered in superomedial orientation as expected.  The demarcation between the mass and the renal parenchyma was somewhat indistinct grossly; therefore, intraoperative ultrasound was performed using drop-in probe.  Intraoperative ultrasound revealed a solid mass corresponding to the area of gross mass.  The parenchymal mass margin was better delineated using intraoperative ultrasound cues and this was scored at the level of the renal parenchyma.  Next, partial nephrectomy was performed using cold scissors keeping what appeared to be a rim of normal parenchyma with the partial nephrectomy specimen.  First-layer renorrhaphy was performed using running 3-0 Vicryl oversewing several small venous sinuses.  Second-layer renorrhaphy was performed using three interrupted 0 Vicryl sutures sandwiched between Hem-O-Lok and lapper ties with a Surgicel bolster in between.  The arterial clamps were removed for total warm ischemia time of 8 minutes and 40 seconds.  Hemostasis appeared excellent at the partial nephrectomy site.  These loops were removed. The specimen was placed in EndoCatch bag.  Robot was then undocked. Closed suction drain was brought through the previous lateral most robotic port site to the area of the peritoneal cavity.  The superior most assistant port site was closed at the level of the fascia using Carter-Thomason suture passer and 0 Vicryl.  Specimen was retrieved by extending the inferior most assistant port site for distance approximately 3 cm removing the partial nephrectomy specimen, setting aside for permanent pathology.  There was a small rent in the  mass occurring during extraction.  A stitch was placed on this noted as per pathology report.  The extraction site was closed at the level of the fascia using figure-of-eight PDS x2, and Scarpa was reapproximated with running Vicryl.  All incision sites were infiltrated with dilute lyophilized Marcaine and closed at the level of the skin using subcuticular Monocryl followed by Dermabond.  Procedure was then terminated.  The patient tolerated the procedure well.  There were no immediate periprocedural complications.  The patient was taken to the postanesthesia care unit in stable condition.  Please note, first assistant, Debbrah Alar, was absolutely crucial for all portions of the robotic procedure today.  She provided invaluable retraction, vascular clamping, suctioning, suture passage; without which, this would not be possible.          ______________________________ Alexis Frock, MD     TM/MEDQ  D:  06/18/2017  T:  06/18/2017  Job:  [id number]

## 2017-07-05 DIAGNOSIS — C642 Malignant neoplasm of left kidney, except renal pelvis: Secondary | ICD-10-CM | POA: Diagnosis not present

## 2017-08-11 DIAGNOSIS — E113313 Type 2 diabetes mellitus with moderate nonproliferative diabetic retinopathy with macular edema, bilateral: Secondary | ICD-10-CM | POA: Diagnosis not present

## 2017-08-11 DIAGNOSIS — H353131 Nonexudative age-related macular degeneration, bilateral, early dry stage: Secondary | ICD-10-CM | POA: Diagnosis not present

## 2017-08-16 DIAGNOSIS — E1165 Type 2 diabetes mellitus with hyperglycemia: Secondary | ICD-10-CM | POA: Diagnosis not present

## 2017-08-16 DIAGNOSIS — E1122 Type 2 diabetes mellitus with diabetic chronic kidney disease: Secondary | ICD-10-CM | POA: Diagnosis not present

## 2017-08-16 DIAGNOSIS — C642 Malignant neoplasm of left kidney, except renal pelvis: Secondary | ICD-10-CM | POA: Diagnosis not present

## 2017-08-16 DIAGNOSIS — I10 Essential (primary) hypertension: Secondary | ICD-10-CM | POA: Diagnosis not present

## 2017-08-16 DIAGNOSIS — Z9189 Other specified personal risk factors, not elsewhere classified: Secondary | ICD-10-CM | POA: Diagnosis not present

## 2017-08-16 DIAGNOSIS — E871 Hypo-osmolality and hyponatremia: Secondary | ICD-10-CM | POA: Diagnosis not present

## 2017-08-16 DIAGNOSIS — E782 Mixed hyperlipidemia: Secondary | ICD-10-CM | POA: Diagnosis not present

## 2017-08-16 DIAGNOSIS — E876 Hypokalemia: Secondary | ICD-10-CM | POA: Diagnosis not present

## 2017-08-16 DIAGNOSIS — D500 Iron deficiency anemia secondary to blood loss (chronic): Secondary | ICD-10-CM | POA: Diagnosis not present

## 2017-08-16 DIAGNOSIS — K219 Gastro-esophageal reflux disease without esophagitis: Secondary | ICD-10-CM | POA: Diagnosis not present

## 2017-08-18 DIAGNOSIS — D500 Iron deficiency anemia secondary to blood loss (chronic): Secondary | ICD-10-CM | POA: Diagnosis not present

## 2017-08-18 DIAGNOSIS — E1122 Type 2 diabetes mellitus with diabetic chronic kidney disease: Secondary | ICD-10-CM | POA: Diagnosis not present

## 2017-08-18 DIAGNOSIS — N183 Chronic kidney disease, stage 3 (moderate): Secondary | ICD-10-CM | POA: Diagnosis not present

## 2017-08-18 DIAGNOSIS — I10 Essential (primary) hypertension: Secondary | ICD-10-CM | POA: Diagnosis not present

## 2017-08-18 DIAGNOSIS — J0100 Acute maxillary sinusitis, unspecified: Secondary | ICD-10-CM | POA: Diagnosis not present

## 2017-08-18 DIAGNOSIS — Z6829 Body mass index (BMI) 29.0-29.9, adult: Secondary | ICD-10-CM | POA: Diagnosis not present

## 2017-08-18 DIAGNOSIS — C642 Malignant neoplasm of left kidney, except renal pelvis: Secondary | ICD-10-CM | POA: Diagnosis not present

## 2017-08-18 DIAGNOSIS — E782 Mixed hyperlipidemia: Secondary | ICD-10-CM | POA: Diagnosis not present

## 2017-08-25 DIAGNOSIS — Z9189 Other specified personal risk factors, not elsewhere classified: Secondary | ICD-10-CM | POA: Diagnosis not present

## 2017-08-25 DIAGNOSIS — I10 Essential (primary) hypertension: Secondary | ICD-10-CM | POA: Diagnosis not present

## 2017-08-25 DIAGNOSIS — K210 Gastro-esophageal reflux disease with esophagitis: Secondary | ICD-10-CM | POA: Diagnosis not present

## 2017-08-25 DIAGNOSIS — E876 Hypokalemia: Secondary | ICD-10-CM | POA: Diagnosis not present

## 2017-08-25 DIAGNOSIS — N183 Chronic kidney disease, stage 3 (moderate): Secondary | ICD-10-CM | POA: Diagnosis not present

## 2017-08-25 DIAGNOSIS — E871 Hypo-osmolality and hyponatremia: Secondary | ICD-10-CM | POA: Diagnosis not present

## 2017-09-02 DIAGNOSIS — M85852 Other specified disorders of bone density and structure, left thigh: Secondary | ICD-10-CM | POA: Diagnosis not present

## 2017-09-15 DIAGNOSIS — H02831 Dermatochalasis of right upper eyelid: Secondary | ICD-10-CM | POA: Diagnosis not present

## 2017-09-15 DIAGNOSIS — H02834 Dermatochalasis of left upper eyelid: Secondary | ICD-10-CM | POA: Diagnosis not present

## 2017-09-18 DIAGNOSIS — Z6830 Body mass index (BMI) 30.0-30.9, adult: Secondary | ICD-10-CM | POA: Diagnosis not present

## 2017-09-18 DIAGNOSIS — K5732 Diverticulitis of large intestine without perforation or abscess without bleeding: Secondary | ICD-10-CM | POA: Diagnosis not present

## 2017-10-28 DIAGNOSIS — H02834 Dermatochalasis of left upper eyelid: Secondary | ICD-10-CM | POA: Diagnosis not present

## 2017-10-28 DIAGNOSIS — H02831 Dermatochalasis of right upper eyelid: Secondary | ICD-10-CM | POA: Diagnosis not present

## 2017-12-07 DIAGNOSIS — E782 Mixed hyperlipidemia: Secondary | ICD-10-CM | POA: Diagnosis not present

## 2017-12-07 DIAGNOSIS — I10 Essential (primary) hypertension: Secondary | ICD-10-CM | POA: Diagnosis not present

## 2017-12-07 DIAGNOSIS — F331 Major depressive disorder, recurrent, moderate: Secondary | ICD-10-CM | POA: Diagnosis not present

## 2017-12-07 DIAGNOSIS — K219 Gastro-esophageal reflux disease without esophagitis: Secondary | ICD-10-CM | POA: Diagnosis not present

## 2017-12-27 DIAGNOSIS — Z6829 Body mass index (BMI) 29.0-29.9, adult: Secondary | ICD-10-CM | POA: Diagnosis not present

## 2017-12-27 DIAGNOSIS — R300 Dysuria: Secondary | ICD-10-CM | POA: Diagnosis not present

## 2017-12-27 DIAGNOSIS — K5732 Diverticulitis of large intestine without perforation or abscess without bleeding: Secondary | ICD-10-CM | POA: Diagnosis not present

## 2017-12-29 DIAGNOSIS — Z9189 Other specified personal risk factors, not elsewhere classified: Secondary | ICD-10-CM | POA: Diagnosis not present

## 2017-12-29 DIAGNOSIS — B0229 Other postherpetic nervous system involvement: Secondary | ICD-10-CM | POA: Diagnosis not present

## 2017-12-29 DIAGNOSIS — E1142 Type 2 diabetes mellitus with diabetic polyneuropathy: Secondary | ICD-10-CM | POA: Diagnosis not present

## 2017-12-29 DIAGNOSIS — E1122 Type 2 diabetes mellitus with diabetic chronic kidney disease: Secondary | ICD-10-CM | POA: Diagnosis not present

## 2017-12-29 DIAGNOSIS — E1165 Type 2 diabetes mellitus with hyperglycemia: Secondary | ICD-10-CM | POA: Diagnosis not present

## 2017-12-29 DIAGNOSIS — I10 Essential (primary) hypertension: Secondary | ICD-10-CM | POA: Diagnosis not present

## 2017-12-29 DIAGNOSIS — E876 Hypokalemia: Secondary | ICD-10-CM | POA: Diagnosis not present

## 2017-12-29 DIAGNOSIS — K210 Gastro-esophageal reflux disease with esophagitis: Secondary | ICD-10-CM | POA: Diagnosis not present

## 2017-12-29 DIAGNOSIS — E782 Mixed hyperlipidemia: Secondary | ICD-10-CM | POA: Diagnosis not present

## 2017-12-29 DIAGNOSIS — E871 Hypo-osmolality and hyponatremia: Secondary | ICD-10-CM | POA: Diagnosis not present

## 2017-12-31 DIAGNOSIS — Z6829 Body mass index (BMI) 29.0-29.9, adult: Secondary | ICD-10-CM | POA: Diagnosis not present

## 2017-12-31 DIAGNOSIS — R0602 Shortness of breath: Secondary | ICD-10-CM | POA: Diagnosis not present

## 2017-12-31 DIAGNOSIS — Z0001 Encounter for general adult medical examination with abnormal findings: Secondary | ICD-10-CM | POA: Diagnosis not present

## 2017-12-31 DIAGNOSIS — I10 Essential (primary) hypertension: Secondary | ICD-10-CM | POA: Diagnosis not present

## 2017-12-31 DIAGNOSIS — E1169 Type 2 diabetes mellitus with other specified complication: Secondary | ICD-10-CM | POA: Diagnosis not present

## 2018-01-05 DIAGNOSIS — N184 Chronic kidney disease, stage 4 (severe): Secondary | ICD-10-CM | POA: Diagnosis not present

## 2018-01-12 DIAGNOSIS — N184 Chronic kidney disease, stage 4 (severe): Secondary | ICD-10-CM | POA: Diagnosis not present

## 2018-01-12 DIAGNOSIS — E871 Hypo-osmolality and hyponatremia: Secondary | ICD-10-CM | POA: Diagnosis not present

## 2018-01-12 DIAGNOSIS — E1122 Type 2 diabetes mellitus with diabetic chronic kidney disease: Secondary | ICD-10-CM | POA: Diagnosis not present

## 2018-01-12 DIAGNOSIS — K210 Gastro-esophageal reflux disease with esophagitis: Secondary | ICD-10-CM | POA: Diagnosis not present

## 2018-01-14 ENCOUNTER — Encounter: Payer: Self-pay | Admitting: Orthopedic Surgery

## 2018-01-14 ENCOUNTER — Ambulatory Visit (INDEPENDENT_AMBULATORY_CARE_PROVIDER_SITE_OTHER): Payer: Medicare Other | Admitting: Orthopedic Surgery

## 2018-01-14 VITALS — BP 149/78 | HR 68 | Ht 66.0 in | Wt 183.0 lb

## 2018-01-14 DIAGNOSIS — M65341 Trigger finger, right ring finger: Secondary | ICD-10-CM | POA: Diagnosis not present

## 2018-01-14 NOTE — Progress Notes (Signed)
Chief Complaint  Patient presents with  . Hand Pain    Right hand ring finger    Right ring finger triggering    Recurrent , previous injection in December did well but pain burning has returned over the A1 pulley right ring finger.  We briefly discussed possible ejection versus surgery and we decided to go with the injection   Trigger finger injection   Diagnosis  Right ring finger  Procedure injection A1 pulley Medications lidocaine 1% 1 mL and Depo-Medrol 40 mg 1 mL Skin prep alcohol and ethyl chloride Verbal consent was obtained Timeout confirmed the injection site   After cleaning the skin with alcohol and anesthetizing the skin with ethyl chloride the A1 pulley was palpated and the injection was performed without complication       Encounter Diagnosis  Name Primary?  . Trigger ring finger of right hand Yes    FU AS NEEDED

## 2018-02-02 ENCOUNTER — Ambulatory Visit (HOSPITAL_COMMUNITY)
Admission: RE | Admit: 2018-02-02 | Discharge: 2018-02-02 | Disposition: A | Discharge status: Home / Self Care | Payer: Medicare Other | Source: Ambulatory Visit | Attending: Urology | Admitting: Urology

## 2018-02-02 ENCOUNTER — Other Ambulatory Visit: Payer: Self-pay | Admitting: Urology

## 2018-02-02 DIAGNOSIS — C642 Malignant neoplasm of left kidney, except renal pelvis: Secondary | ICD-10-CM | POA: Diagnosis not present

## 2018-02-02 DIAGNOSIS — Z85520 Personal history of malignant carcinoid tumor of kidney: Secondary | ICD-10-CM | POA: Diagnosis not present

## 2018-02-04 DIAGNOSIS — C642 Malignant neoplasm of left kidney, except renal pelvis: Secondary | ICD-10-CM | POA: Diagnosis not present

## 2018-02-14 DIAGNOSIS — N183 Chronic kidney disease, stage 3 (moderate): Secondary | ICD-10-CM | POA: Diagnosis not present

## 2018-02-14 DIAGNOSIS — C642 Malignant neoplasm of left kidney, except renal pelvis: Secondary | ICD-10-CM | POA: Diagnosis not present

## 2018-02-15 DIAGNOSIS — M7731 Calcaneal spur, right foot: Secondary | ICD-10-CM | POA: Diagnosis not present

## 2018-02-15 DIAGNOSIS — M722 Plantar fascial fibromatosis: Secondary | ICD-10-CM | POA: Diagnosis not present

## 2018-02-21 DIAGNOSIS — M722 Plantar fascial fibromatosis: Secondary | ICD-10-CM | POA: Diagnosis not present

## 2018-02-24 DIAGNOSIS — Z23 Encounter for immunization: Secondary | ICD-10-CM | POA: Diagnosis not present

## 2018-03-07 DIAGNOSIS — E1122 Type 2 diabetes mellitus with diabetic chronic kidney disease: Secondary | ICD-10-CM | POA: Diagnosis not present

## 2018-03-07 DIAGNOSIS — K5732 Diverticulitis of large intestine without perforation or abscess without bleeding: Secondary | ICD-10-CM | POA: Diagnosis not present

## 2018-03-07 DIAGNOSIS — Z6830 Body mass index (BMI) 30.0-30.9, adult: Secondary | ICD-10-CM | POA: Diagnosis not present

## 2018-03-09 DIAGNOSIS — I10 Essential (primary) hypertension: Secondary | ICD-10-CM | POA: Diagnosis not present

## 2018-03-09 DIAGNOSIS — E782 Mixed hyperlipidemia: Secondary | ICD-10-CM | POA: Diagnosis not present

## 2018-03-09 DIAGNOSIS — K219 Gastro-esophageal reflux disease without esophagitis: Secondary | ICD-10-CM | POA: Diagnosis not present

## 2018-03-09 DIAGNOSIS — M722 Plantar fascial fibromatosis: Secondary | ICD-10-CM | POA: Diagnosis not present

## 2018-03-09 DIAGNOSIS — M71571 Other bursitis, not elsewhere classified, right ankle and foot: Secondary | ICD-10-CM | POA: Diagnosis not present

## 2018-03-21 DIAGNOSIS — N183 Chronic kidney disease, stage 3 (moderate): Secondary | ICD-10-CM | POA: Diagnosis not present

## 2018-03-21 DIAGNOSIS — I509 Heart failure, unspecified: Secondary | ICD-10-CM | POA: Diagnosis not present

## 2018-03-21 DIAGNOSIS — I10 Essential (primary) hypertension: Secondary | ICD-10-CM | POA: Diagnosis not present

## 2018-03-24 DIAGNOSIS — Z79899 Other long term (current) drug therapy: Secondary | ICD-10-CM | POA: Diagnosis not present

## 2018-03-24 DIAGNOSIS — D519 Vitamin B12 deficiency anemia, unspecified: Secondary | ICD-10-CM | POA: Diagnosis not present

## 2018-03-24 DIAGNOSIS — I10 Essential (primary) hypertension: Secondary | ICD-10-CM | POA: Diagnosis not present

## 2018-03-24 DIAGNOSIS — Z1159 Encounter for screening for other viral diseases: Secondary | ICD-10-CM | POA: Diagnosis not present

## 2018-03-24 DIAGNOSIS — N183 Chronic kidney disease, stage 3 (moderate): Secondary | ICD-10-CM | POA: Diagnosis not present

## 2018-03-24 DIAGNOSIS — R809 Proteinuria, unspecified: Secondary | ICD-10-CM | POA: Diagnosis not present

## 2018-03-24 DIAGNOSIS — D509 Iron deficiency anemia, unspecified: Secondary | ICD-10-CM | POA: Diagnosis not present

## 2018-03-24 DIAGNOSIS — E559 Vitamin D deficiency, unspecified: Secondary | ICD-10-CM | POA: Diagnosis not present

## 2018-04-21 DIAGNOSIS — R809 Proteinuria, unspecified: Secondary | ICD-10-CM | POA: Diagnosis not present

## 2018-04-21 DIAGNOSIS — N183 Chronic kidney disease, stage 3 (moderate): Secondary | ICD-10-CM | POA: Diagnosis not present

## 2018-04-21 DIAGNOSIS — E559 Vitamin D deficiency, unspecified: Secondary | ICD-10-CM | POA: Diagnosis not present

## 2018-04-21 DIAGNOSIS — E1129 Type 2 diabetes mellitus with other diabetic kidney complication: Secondary | ICD-10-CM | POA: Diagnosis not present

## 2018-04-25 DIAGNOSIS — N184 Chronic kidney disease, stage 4 (severe): Secondary | ICD-10-CM | POA: Diagnosis not present

## 2018-04-25 DIAGNOSIS — E782 Mixed hyperlipidemia: Secondary | ICD-10-CM | POA: Diagnosis not present

## 2018-04-25 DIAGNOSIS — I10 Essential (primary) hypertension: Secondary | ICD-10-CM | POA: Diagnosis not present

## 2018-04-25 DIAGNOSIS — R5383 Other fatigue: Secondary | ICD-10-CM | POA: Diagnosis not present

## 2018-04-25 DIAGNOSIS — E1122 Type 2 diabetes mellitus with diabetic chronic kidney disease: Secondary | ICD-10-CM | POA: Diagnosis not present

## 2018-04-25 DIAGNOSIS — K210 Gastro-esophageal reflux disease with esophagitis: Secondary | ICD-10-CM | POA: Diagnosis not present

## 2018-04-25 DIAGNOSIS — E876 Hypokalemia: Secondary | ICD-10-CM | POA: Diagnosis not present

## 2018-04-28 DIAGNOSIS — Z6832 Body mass index (BMI) 32.0-32.9, adult: Secondary | ICD-10-CM | POA: Diagnosis not present

## 2018-04-28 DIAGNOSIS — E1122 Type 2 diabetes mellitus with diabetic chronic kidney disease: Secondary | ICD-10-CM | POA: Diagnosis not present

## 2018-04-28 DIAGNOSIS — N183 Chronic kidney disease, stage 3 (moderate): Secondary | ICD-10-CM | POA: Diagnosis not present

## 2018-04-28 DIAGNOSIS — D500 Iron deficiency anemia secondary to blood loss (chronic): Secondary | ICD-10-CM | POA: Diagnosis not present

## 2018-04-28 DIAGNOSIS — E782 Mixed hyperlipidemia: Secondary | ICD-10-CM | POA: Diagnosis not present

## 2018-04-28 DIAGNOSIS — J329 Chronic sinusitis, unspecified: Secondary | ICD-10-CM | POA: Diagnosis not present

## 2018-04-28 DIAGNOSIS — C642 Malignant neoplasm of left kidney, except renal pelvis: Secondary | ICD-10-CM | POA: Diagnosis not present

## 2018-04-28 DIAGNOSIS — I10 Essential (primary) hypertension: Secondary | ICD-10-CM | POA: Diagnosis not present

## 2018-05-13 DIAGNOSIS — I129 Hypertensive chronic kidney disease with stage 1 through stage 4 chronic kidney disease, or unspecified chronic kidney disease: Secondary | ICD-10-CM | POA: Diagnosis not present

## 2018-05-13 DIAGNOSIS — N183 Chronic kidney disease, stage 3 (moderate): Secondary | ICD-10-CM | POA: Diagnosis not present

## 2018-05-13 DIAGNOSIS — Z79899 Other long term (current) drug therapy: Secondary | ICD-10-CM | POA: Diagnosis not present

## 2018-05-28 DIAGNOSIS — K5792 Diverticulitis of intestine, part unspecified, without perforation or abscess without bleeding: Secondary | ICD-10-CM | POA: Diagnosis not present

## 2018-05-28 DIAGNOSIS — Z6830 Body mass index (BMI) 30.0-30.9, adult: Secondary | ICD-10-CM | POA: Diagnosis not present

## 2018-05-30 DIAGNOSIS — K5730 Diverticulosis of large intestine without perforation or abscess without bleeding: Secondary | ICD-10-CM | POA: Diagnosis not present

## 2018-05-30 DIAGNOSIS — R110 Nausea: Secondary | ICD-10-CM | POA: Diagnosis not present

## 2018-05-30 DIAGNOSIS — R1032 Left lower quadrant pain: Secondary | ICD-10-CM | POA: Diagnosis not present

## 2018-05-30 DIAGNOSIS — E860 Dehydration: Secondary | ICD-10-CM | POA: Diagnosis not present

## 2018-05-30 DIAGNOSIS — R109 Unspecified abdominal pain: Secondary | ICD-10-CM | POA: Diagnosis not present

## 2018-05-31 ENCOUNTER — Ambulatory Visit (INDEPENDENT_AMBULATORY_CARE_PROVIDER_SITE_OTHER): Payer: Medicare Other | Admitting: Internal Medicine

## 2018-05-31 ENCOUNTER — Encounter (INDEPENDENT_AMBULATORY_CARE_PROVIDER_SITE_OTHER): Payer: Self-pay | Admitting: Internal Medicine

## 2018-05-31 VITALS — BP 151/75 | HR 72 | Temp 98.0°F | Ht 64.0 in | Wt 184.0 lb

## 2018-05-31 DIAGNOSIS — K5732 Diverticulitis of large intestine without perforation or abscess without bleeding: Secondary | ICD-10-CM | POA: Diagnosis not present

## 2018-05-31 MED ORDER — AMOXICILLIN-POT CLAVULANATE 875-125 MG PO TABS: 1.0000 | ORAL_TABLET | Freq: Two times a day (BID) | ORAL | 0 refills | Status: DC

## 2018-05-31 NOTE — Progress Notes (Signed)
Subjective:    Patient ID: Angela Harris, female    DOB: [birth year], 73 y.o.   MRN: [id number]  HPI Referred by Dr. Hinda Lenis for change in stools. Her last colonoscopy was in January of 2018 (heme positive stool) Dr. Anthony Sar and was normal (epic).  She tells me she keeps having diverticulitis. She says her stools have changed. She was seen in the ED at [facility] 05/30/2017. She underwent a CT scan and was which revealed uncomplicated diverticulitis.  Given an Rx for Flagyl 500mg  TID x 7 days and Cipro 500mg  BID x 7 days. She tells me she has had about 4-5 bouts of diverticulitis over the year.  She is on Augmentin covered for 7 days given by Kassie Mends PA-C at [facility]. Pain LLQ x 6 days.  Diarrhea since last Wednesday.  Stools are watery, and brown. Stools are skinner than they use to be. Change in stool x 3 weeks.  Appetite is not good.  Patient also underwent an EGD with snare technique removal of polyps  05/29/2016 by Dr. Anthony Sar. Stomach: A medium sized polypoid shaped sessile polyp with a friable surface was found in the gastric fundus. A polypectomy was performed. The resection was incomplete. Hemostasis was adequate. Biopsy: Hyperplastic polyp.   05/30/2017 H and H 15.5 and 47.2  Hx of CKD Hx of diabetes, hypertension 06/18/2017: XI robotic assisted partial nephrectomy for left renal mass.  Biopsy: Renal cell carcinoma.  03/24/2018 TIBC 397, Iron 79, Iron sat 20.  ALCA 77, Ferritin 24, H and H [id number] and 44.   Review of Systems Past Medical History:  Diagnosis Date  . Anemia   . CKD (chronic kidney disease) stage 3, GFR 30-59 ml/min (HCC)   . Depression   . Dysrhythmia    LBBB  . Essential hypertension   . GERD (gastroesophageal reflux disease)   . History of TIA (transient ischemic attack) 2015  . Hyperlipidemia   . Meniere disease    pt denies  . Peripheral neuropathy   . PONV (postoperative nausea and vomiting)   . Stroke [facility]) [address]   "mini-stroke". No deficits from this    . Type 2 diabetes mellitus ([place])     Past Surgical History:  Procedure Laterality Date  . ABDOMINAL HYSTERECTOMY    . BACK SURGERY     ruptured disc lower back  . CARPAL TUNNEL RELEASE Right 08/20/2016   Procedure: CARPAL TUNNEL RELEASE;  Surgeon: Carole Civil, MD;  Location: [facility];  Service: Orthopedics;  Laterality: Right;  . CATARACT EXTRACTION, BILATERAL    . CESAREAN SECTION    . CHOLECYSTECTOMY    . Closed reduction right wrist fracture Right   . ECTOPIC PREGNANCY SURGERY    . ORIF WRIST FRACTURE Right 02/26/2016   Procedure: OPEN REDUCTION INTERNAL FIXATION (ORIF) WRIST FRACTURE;  Surgeon: Carole Civil, MD;  Location: [facility];  Service: Orthopedics;  Laterality: Right;  TIME 12:30  . ROBOTIC ASSITED PARTIAL NEPHRECTOMY Left 06/18/2017   Procedure: XI ROBOTIC ASSITED PARTIAL NEPHRECTOMY;  Surgeon: Alexis Frock, MD;  Location: [facility];  Service: Urology;  Laterality: Left;  . TONSILLECTOMY      No Known Allergies  Current Outpatient Medications on File Prior to Visit  Medication Sig Dispense Refill  . acetaminophen (TYLENOL) 500 MG tablet Take 1,000 mg by mouth every 6 (six) hours as needed for mild pain.    Marland Kitchen amLODipine (NORVASC) 10 MG tablet Take 10 mg by mouth daily.    Marland Kitchen aspirin EC  81 MG tablet Take 81 mg by mouth daily.    Marland Kitchen atenolol (TENORMIN) 50 MG tablet Take 50 mg by mouth daily.    . Calcium Carb-Cholecalciferol [phone] MG-UNIT CAPS Take by mouth.    . FERREX 150 150 MG capsule Take 150 mg by mouth 2 (two) times daily.     Marland Kitchen gabapentin (NEURONTIN) 300 MG capsule Take 300 mg by mouth 2 (two) times daily.    . insulin NPH-regular Human (NOVOLIN 70/30) (70-30) 100 UNIT/ML injection Inject 35-40 Units into the skin 2 (two) times daily with a meal. Per sliding scale 35 units am and 45 in pm.    . lisinopril (PRINIVIL,ZESTRIL) 40 MG tablet Take 40 mg by mouth daily.    . pantoprazole (PROTONIX) 40 MG tablet Take 40 mg by mouth 2 (two) times daily.     .  simvastatin (ZOCOR) 40 MG tablet Take 40 mg by mouth at bedtime.     Marland Kitchen venlafaxine XR (EFFEXOR-XR) 75 MG 24 hr capsule Take 75 mg by mouth daily with breakfast.    . ONE TOUCH ULTRA TEST test strip USE ONE STRIP TO CHECK GLUCOSE 4 TIMES DAILY 150 each 5   No current facility-administered medications on file prior to visit.         Objective:   Physical Exam Alert and oriented. Skin warm and dry. Oral mucosa is moist.   . Sclera anicteric, conjunctivae is pink. Thyroid not enlarged. No cervical lymphadenopathy. Lungs clear. Heart regular rate and rhythm.  Abdomen is soft. Bowel sounds are positive. No hepatomegaly. No abdominal masses felt. T enderness LLQ.  No edema to lower extremities.  .        Assessment & Plan:  Diverticulitis. She will continue the Augmentin for another 10 days. Will get records from [facility] before proceeding with a colonoscopy.  Biopsy of polyp in gastric fundus was hyperplastic.Will discuss with Dr. Laural Golden

## 2018-05-31 NOTE — Patient Instructions (Addendum)
Will get records from [facility] Further recommendations to follow.  Diverticulitis  Diverticulitis is when small pockets in your large intestine (colon) get infected or swollen. This causes stomach pain and watery poop (diarrhea). These pouches are called diverticula. They form in people who have a condition called diverticulosis. Follow these instructions at home: Medicines  Take over-the-counter and prescription medicines only as told by your doctor. These include: ? Antibiotics. ? Pain medicines. ? Fiber pills. ? Probiotics. ? Stool softeners.  Do not drive or use heavy machinery while taking prescription pain medicine.  If you were prescribed an antibiotic, take it as told. Do not stop taking it even if you feel better. General instructions   Follow a diet as told by your doctor.  When you feel better, your doctor may tell you to change your diet. You may need to eat a lot of fiber. Fiber makes it easier to poop (have bowel movements). Healthy foods with fiber include: ? Berries. ? Beans. ? Lentils. ? Green vegetables.  Exercise 3 or more times a week. Aim for 30 minutes each time. Exercise enough to sweat and make your heart beat faster.  Keep all follow-up visits as told. This is important. You may need to have an exam of the large intestine. This is called a colonoscopy. Contact a doctor if:  Your pain does not get better.  You have a hard time eating or drinking.  You are not pooping like normal. Get help right away if:  Your pain gets worse.  Your problems do not get better.  Your problems get worse very fast.  You have a fever.  You throw up (vomit) more than one time.  You have poop that is: ? Bloody. ? Black. ? Tarry. Summary  Diverticulitis is when small pockets in your large intestine (colon) get infected or swollen.  Take medicines only as told by your doctor.  Follow a diet as told by your doctor. This information is not intended to replace  advice given to you by your health care provider. Make sure you discuss any questions you have with your health care provider. Document Released: 10/14/2007 Document Revised: 05/14/2016 Document Reviewed: 05/14/2016 Elsevier Interactive Patient Education  2019 [employer].

## 2018-06-03 ENCOUNTER — Telehealth (INDEPENDENT_AMBULATORY_CARE_PROVIDER_SITE_OTHER): Payer: Self-pay | Admitting: Internal Medicine

## 2018-06-03 ENCOUNTER — Other Ambulatory Visit (INDEPENDENT_AMBULATORY_CARE_PROVIDER_SITE_OTHER): Payer: Self-pay | Admitting: Internal Medicine

## 2018-06-03 DIAGNOSIS — R195 Other fecal abnormalities: Secondary | ICD-10-CM

## 2018-06-03 DIAGNOSIS — K5732 Diverticulitis of large intestine without perforation or abscess without bleeding: Secondary | ICD-10-CM

## 2018-06-03 NOTE — Telephone Encounter (Signed)
err

## 2018-06-03 NOTE — Telephone Encounter (Signed)
Patient does not need an EGD since the polyp in her stomach was hyperplastic. She will need a colonoscopy and this has been ordered. I have spoken with patient.

## 2018-06-03 NOTE — Telephone Encounter (Signed)
Angela Harris, colonoscopy. She is on iron and ASA. I have talked with patient. Also told her u would call next week.

## 2018-06-06 ENCOUNTER — Telehealth (INDEPENDENT_AMBULATORY_CARE_PROVIDER_SITE_OTHER): Payer: Self-pay | Admitting: Internal Medicine

## 2018-06-06 ENCOUNTER — Ambulatory Visit (INDEPENDENT_AMBULATORY_CARE_PROVIDER_SITE_OTHER): Payer: Medicare Other | Admitting: Internal Medicine

## 2018-06-06 DIAGNOSIS — K5732 Diverticulitis of large intestine without perforation or abscess without bleeding: Secondary | ICD-10-CM | POA: Insufficient documentation

## 2018-06-06 DIAGNOSIS — R195 Other fecal abnormalities: Secondary | ICD-10-CM | POA: Insufficient documentation

## 2018-06-06 NOTE — Telephone Encounter (Signed)
Angela Harris, Colonoscopy needs to be at least 8 weeks out. She has diverticulitis

## 2018-06-06 NOTE — Telephone Encounter (Signed)
TCS sch'd 08/11/18, patient aware

## 2018-06-06 NOTE — Telephone Encounter (Signed)
TCS sch'd 08/11/18

## 2018-06-09 DIAGNOSIS — I10 Essential (primary) hypertension: Secondary | ICD-10-CM | POA: Diagnosis not present

## 2018-06-09 DIAGNOSIS — F331 Major depressive disorder, recurrent, moderate: Secondary | ICD-10-CM | POA: Diagnosis not present

## 2018-06-09 DIAGNOSIS — E782 Mixed hyperlipidemia: Secondary | ICD-10-CM | POA: Diagnosis not present

## 2018-07-08 DIAGNOSIS — E782 Mixed hyperlipidemia: Secondary | ICD-10-CM | POA: Diagnosis not present

## 2018-07-08 DIAGNOSIS — F331 Major depressive disorder, recurrent, moderate: Secondary | ICD-10-CM | POA: Diagnosis not present

## 2018-07-12 ENCOUNTER — Telehealth (INDEPENDENT_AMBULATORY_CARE_PROVIDER_SITE_OTHER): Payer: Self-pay | Admitting: *Deleted

## 2018-07-12 ENCOUNTER — Encounter (INDEPENDENT_AMBULATORY_CARE_PROVIDER_SITE_OTHER): Payer: Self-pay | Admitting: *Deleted

## 2018-07-12 DIAGNOSIS — R112 Nausea with vomiting, unspecified: Secondary | ICD-10-CM | POA: Diagnosis not present

## 2018-07-12 DIAGNOSIS — E119 Type 2 diabetes mellitus without complications: Secondary | ICD-10-CM | POA: Diagnosis not present

## 2018-07-12 DIAGNOSIS — Z794 Long term (current) use of insulin: Secondary | ICD-10-CM | POA: Diagnosis not present

## 2018-07-12 DIAGNOSIS — R197 Diarrhea, unspecified: Secondary | ICD-10-CM | POA: Diagnosis not present

## 2018-07-12 DIAGNOSIS — K219 Gastro-esophageal reflux disease without esophagitis: Secondary | ICD-10-CM | POA: Diagnosis not present

## 2018-07-12 DIAGNOSIS — Z79899 Other long term (current) drug therapy: Secondary | ICD-10-CM | POA: Diagnosis not present

## 2018-07-12 DIAGNOSIS — Z8673 Personal history of transient ischemic attack (TIA), and cerebral infarction without residual deficits: Secondary | ICD-10-CM | POA: Diagnosis not present

## 2018-07-12 DIAGNOSIS — R6883 Chills (without fever): Secondary | ICD-10-CM | POA: Diagnosis not present

## 2018-07-12 DIAGNOSIS — R109 Unspecified abdominal pain: Secondary | ICD-10-CM | POA: Diagnosis not present

## 2018-07-12 NOTE — Telephone Encounter (Signed)
Patient needs trilyte 

## 2018-07-13 MED ORDER — PEG 3350-KCL-NA BICARB-NACL 420 G PO SOLR: 4000.0000 mL | Freq: Once | ORAL | 0 refills | Status: AC

## 2018-07-29 ENCOUNTER — Telehealth (INDEPENDENT_AMBULATORY_CARE_PROVIDER_SITE_OTHER): Payer: Self-pay | Admitting: *Deleted

## 2018-07-29 NOTE — Telephone Encounter (Signed)
Patient called -- wants to cancel TCS due to coronavirus

## 2018-08-01 NOTE — Telephone Encounter (Signed)
noted 

## 2018-08-11 ENCOUNTER — Ambulatory Visit (HOSPITAL_COMMUNITY): Admit: 2018-08-11 | Payer: Medicare Other | Admitting: Internal Medicine

## 2018-08-11 ENCOUNTER — Encounter (HOSPITAL_COMMUNITY): Payer: Self-pay

## 2018-08-11 SURGERY — COLONOSCOPY
Anesthesia: Moderate Sedation

## 2018-08-23 DIAGNOSIS — K210 Gastro-esophageal reflux disease with esophagitis: Secondary | ICD-10-CM | POA: Diagnosis not present

## 2018-08-23 DIAGNOSIS — N184 Chronic kidney disease, stage 4 (severe): Secondary | ICD-10-CM | POA: Diagnosis not present

## 2018-08-23 DIAGNOSIS — I10 Essential (primary) hypertension: Secondary | ICD-10-CM | POA: Diagnosis not present

## 2018-08-23 DIAGNOSIS — E871 Hypo-osmolality and hyponatremia: Secondary | ICD-10-CM | POA: Diagnosis not present

## 2018-08-23 DIAGNOSIS — E782 Mixed hyperlipidemia: Secondary | ICD-10-CM | POA: Diagnosis not present

## 2018-08-23 DIAGNOSIS — E876 Hypokalemia: Secondary | ICD-10-CM | POA: Diagnosis not present

## 2018-08-23 DIAGNOSIS — E1165 Type 2 diabetes mellitus with hyperglycemia: Secondary | ICD-10-CM | POA: Diagnosis not present

## 2018-08-23 DIAGNOSIS — D500 Iron deficiency anemia secondary to blood loss (chronic): Secondary | ICD-10-CM | POA: Diagnosis not present

## 2018-08-25 DIAGNOSIS — Z6829 Body mass index (BMI) 29.0-29.9, adult: Secondary | ICD-10-CM | POA: Diagnosis not present

## 2018-08-25 DIAGNOSIS — C642 Malignant neoplasm of left kidney, except renal pelvis: Secondary | ICD-10-CM | POA: Diagnosis not present

## 2018-08-25 DIAGNOSIS — E1122 Type 2 diabetes mellitus with diabetic chronic kidney disease: Secondary | ICD-10-CM | POA: Diagnosis not present

## 2018-08-25 DIAGNOSIS — D500 Iron deficiency anemia secondary to blood loss (chronic): Secondary | ICD-10-CM | POA: Diagnosis not present

## 2018-08-25 DIAGNOSIS — E782 Mixed hyperlipidemia: Secondary | ICD-10-CM | POA: Diagnosis not present

## 2018-08-25 DIAGNOSIS — I10 Essential (primary) hypertension: Secondary | ICD-10-CM | POA: Diagnosis not present

## 2018-08-25 DIAGNOSIS — K219 Gastro-esophageal reflux disease without esophagitis: Secondary | ICD-10-CM | POA: Diagnosis not present

## 2018-08-25 DIAGNOSIS — F331 Major depressive disorder, recurrent, moderate: Secondary | ICD-10-CM | POA: Diagnosis not present

## 2018-09-08 DIAGNOSIS — E782 Mixed hyperlipidemia: Secondary | ICD-10-CM | POA: Diagnosis not present

## 2018-09-08 DIAGNOSIS — E1122 Type 2 diabetes mellitus with diabetic chronic kidney disease: Secondary | ICD-10-CM | POA: Diagnosis not present

## 2018-10-05 DIAGNOSIS — Z6829 Body mass index (BMI) 29.0-29.9, adult: Secondary | ICD-10-CM | POA: Diagnosis not present

## 2018-10-05 DIAGNOSIS — K5792 Diverticulitis of intestine, part unspecified, without perforation or abscess without bleeding: Secondary | ICD-10-CM | POA: Diagnosis not present

## 2018-10-05 DIAGNOSIS — D649 Anemia, unspecified: Secondary | ICD-10-CM | POA: Diagnosis not present

## 2018-10-05 DIAGNOSIS — K5730 Diverticulosis of large intestine without perforation or abscess without bleeding: Secondary | ICD-10-CM | POA: Diagnosis not present

## 2018-10-05 DIAGNOSIS — R1032 Left lower quadrant pain: Secondary | ICD-10-CM | POA: Diagnosis not present

## 2018-10-05 DIAGNOSIS — D519 Vitamin B12 deficiency anemia, unspecified: Secondary | ICD-10-CM | POA: Diagnosis not present

## 2018-10-05 DIAGNOSIS — D529 Folate deficiency anemia, unspecified: Secondary | ICD-10-CM | POA: Diagnosis not present

## 2018-10-08 DIAGNOSIS — E1122 Type 2 diabetes mellitus with diabetic chronic kidney disease: Secondary | ICD-10-CM | POA: Diagnosis not present

## 2018-10-08 DIAGNOSIS — E782 Mixed hyperlipidemia: Secondary | ICD-10-CM | POA: Diagnosis not present

## 2018-11-02 DIAGNOSIS — K5792 Diverticulitis of intestine, part unspecified, without perforation or abscess without bleeding: Secondary | ICD-10-CM | POA: Diagnosis not present

## 2018-11-02 DIAGNOSIS — K5732 Diverticulitis of large intestine without perforation or abscess without bleeding: Secondary | ICD-10-CM | POA: Diagnosis not present

## 2018-11-08 DIAGNOSIS — I10 Essential (primary) hypertension: Secondary | ICD-10-CM | POA: Diagnosis not present

## 2018-11-08 DIAGNOSIS — E1165 Type 2 diabetes mellitus with hyperglycemia: Secondary | ICD-10-CM | POA: Diagnosis not present

## 2018-11-08 DIAGNOSIS — E7800 Pure hypercholesterolemia, unspecified: Secondary | ICD-10-CM | POA: Diagnosis not present

## 2018-12-09 DIAGNOSIS — I10 Essential (primary) hypertension: Secondary | ICD-10-CM | POA: Diagnosis not present

## 2018-12-09 DIAGNOSIS — E7800 Pure hypercholesterolemia, unspecified: Secondary | ICD-10-CM | POA: Diagnosis not present

## 2019-01-09 DIAGNOSIS — E1165 Type 2 diabetes mellitus with hyperglycemia: Secondary | ICD-10-CM | POA: Diagnosis not present

## 2019-01-09 DIAGNOSIS — I10 Essential (primary) hypertension: Secondary | ICD-10-CM | POA: Diagnosis not present

## 2019-01-09 DIAGNOSIS — E782 Mixed hyperlipidemia: Secondary | ICD-10-CM | POA: Diagnosis not present

## 2019-01-10 DIAGNOSIS — I10 Essential (primary) hypertension: Secondary | ICD-10-CM | POA: Diagnosis not present

## 2019-01-10 DIAGNOSIS — E871 Hypo-osmolality and hyponatremia: Secondary | ICD-10-CM | POA: Diagnosis not present

## 2019-01-10 DIAGNOSIS — E876 Hypokalemia: Secondary | ICD-10-CM | POA: Diagnosis not present

## 2019-01-10 DIAGNOSIS — R5383 Other fatigue: Secondary | ICD-10-CM | POA: Diagnosis not present

## 2019-01-10 DIAGNOSIS — N184 Chronic kidney disease, stage 4 (severe): Secondary | ICD-10-CM | POA: Diagnosis not present

## 2019-01-10 DIAGNOSIS — E1122 Type 2 diabetes mellitus with diabetic chronic kidney disease: Secondary | ICD-10-CM | POA: Diagnosis not present

## 2019-01-10 DIAGNOSIS — E1142 Type 2 diabetes mellitus with diabetic polyneuropathy: Secondary | ICD-10-CM | POA: Diagnosis not present

## 2019-01-10 DIAGNOSIS — K210 Gastro-esophageal reflux disease with esophagitis: Secondary | ICD-10-CM | POA: Diagnosis not present

## 2019-01-10 DIAGNOSIS — E1165 Type 2 diabetes mellitus with hyperglycemia: Secondary | ICD-10-CM | POA: Diagnosis not present

## 2019-01-10 DIAGNOSIS — E782 Mixed hyperlipidemia: Secondary | ICD-10-CM | POA: Diagnosis not present

## 2019-01-17 DIAGNOSIS — C642 Malignant neoplasm of left kidney, except renal pelvis: Secondary | ICD-10-CM | POA: Diagnosis not present

## 2019-01-17 DIAGNOSIS — Z23 Encounter for immunization: Secondary | ICD-10-CM | POA: Diagnosis not present

## 2019-01-17 DIAGNOSIS — I10 Essential (primary) hypertension: Secondary | ICD-10-CM | POA: Diagnosis not present

## 2019-01-17 DIAGNOSIS — F331 Major depressive disorder, recurrent, moderate: Secondary | ICD-10-CM | POA: Diagnosis not present

## 2019-01-17 DIAGNOSIS — Z6829 Body mass index (BMI) 29.0-29.9, adult: Secondary | ICD-10-CM | POA: Diagnosis not present

## 2019-01-17 DIAGNOSIS — D500 Iron deficiency anemia secondary to blood loss (chronic): Secondary | ICD-10-CM | POA: Diagnosis not present

## 2019-01-17 DIAGNOSIS — E1122 Type 2 diabetes mellitus with diabetic chronic kidney disease: Secondary | ICD-10-CM | POA: Diagnosis not present

## 2019-01-17 DIAGNOSIS — Z0001 Encounter for general adult medical examination with abnormal findings: Secondary | ICD-10-CM | POA: Diagnosis not present

## 2019-02-08 DIAGNOSIS — E782 Mixed hyperlipidemia: Secondary | ICD-10-CM | POA: Diagnosis not present

## 2019-02-08 DIAGNOSIS — I10 Essential (primary) hypertension: Secondary | ICD-10-CM | POA: Diagnosis not present

## 2019-02-10 ENCOUNTER — Other Ambulatory Visit: Payer: Self-pay

## 2019-02-10 ENCOUNTER — Ambulatory Visit (HOSPITAL_COMMUNITY)
Admission: RE | Admit: 2019-02-10 | Discharge: 2019-02-10 | Disposition: A | Discharge status: Home / Self Care | Payer: Medicare Other | Source: Ambulatory Visit | Attending: Anesthesiology | Admitting: Anesthesiology

## 2019-02-10 ENCOUNTER — Other Ambulatory Visit (HOSPITAL_COMMUNITY): Payer: Self-pay | Admitting: Anesthesiology

## 2019-02-10 DIAGNOSIS — C642 Malignant neoplasm of left kidney, except renal pelvis: Secondary | ICD-10-CM

## 2019-02-10 DIAGNOSIS — R918 Other nonspecific abnormal finding of lung field: Secondary | ICD-10-CM | POA: Diagnosis not present

## 2019-02-10 DIAGNOSIS — C649 Malignant neoplasm of unspecified kidney, except renal pelvis: Secondary | ICD-10-CM | POA: Diagnosis not present

## 2019-02-15 DIAGNOSIS — K5730 Diverticulosis of large intestine without perforation or abscess without bleeding: Secondary | ICD-10-CM | POA: Diagnosis not present

## 2019-02-15 DIAGNOSIS — C649 Malignant neoplasm of unspecified kidney, except renal pelvis: Secondary | ICD-10-CM | POA: Diagnosis not present

## 2019-02-15 DIAGNOSIS — C642 Malignant neoplasm of left kidney, except renal pelvis: Secondary | ICD-10-CM | POA: Diagnosis not present

## 2019-02-28 DIAGNOSIS — C642 Malignant neoplasm of left kidney, except renal pelvis: Secondary | ICD-10-CM | POA: Diagnosis not present

## 2019-02-28 DIAGNOSIS — C641 Malignant neoplasm of right kidney, except renal pelvis: Secondary | ICD-10-CM | POA: Diagnosis not present

## 2019-03-10 DIAGNOSIS — E782 Mixed hyperlipidemia: Secondary | ICD-10-CM | POA: Diagnosis not present

## 2019-03-10 DIAGNOSIS — I10 Essential (primary) hypertension: Secondary | ICD-10-CM | POA: Diagnosis not present

## 2019-05-16 ENCOUNTER — Encounter (INDEPENDENT_AMBULATORY_CARE_PROVIDER_SITE_OTHER): Payer: Self-pay

## 2019-05-24 DIAGNOSIS — E782 Mixed hyperlipidemia: Secondary | ICD-10-CM | POA: Diagnosis not present

## 2019-05-24 DIAGNOSIS — E1122 Type 2 diabetes mellitus with diabetic chronic kidney disease: Secondary | ICD-10-CM | POA: Diagnosis not present

## 2019-05-24 DIAGNOSIS — F331 Major depressive disorder, recurrent, moderate: Secondary | ICD-10-CM | POA: Diagnosis not present

## 2019-05-24 DIAGNOSIS — C642 Malignant neoplasm of left kidney, except renal pelvis: Secondary | ICD-10-CM | POA: Diagnosis not present

## 2019-05-24 DIAGNOSIS — Z6830 Body mass index (BMI) 30.0-30.9, adult: Secondary | ICD-10-CM | POA: Diagnosis not present

## 2019-05-24 DIAGNOSIS — D500 Iron deficiency anemia secondary to blood loss (chronic): Secondary | ICD-10-CM | POA: Diagnosis not present

## 2019-05-24 DIAGNOSIS — K625 Hemorrhage of anus and rectum: Secondary | ICD-10-CM | POA: Diagnosis not present

## 2019-05-24 DIAGNOSIS — I10 Essential (primary) hypertension: Secondary | ICD-10-CM | POA: Diagnosis not present

## 2019-05-31 DIAGNOSIS — K625 Hemorrhage of anus and rectum: Secondary | ICD-10-CM | POA: Diagnosis not present

## 2019-06-05 ENCOUNTER — Encounter (INDEPENDENT_AMBULATORY_CARE_PROVIDER_SITE_OTHER): Payer: Self-pay

## 2019-06-06 DIAGNOSIS — Z01818 Encounter for other preprocedural examination: Secondary | ICD-10-CM | POA: Diagnosis not present

## 2019-06-08 DIAGNOSIS — D127 Benign neoplasm of rectosigmoid junction: Secondary | ICD-10-CM | POA: Diagnosis not present

## 2019-06-08 DIAGNOSIS — G629 Polyneuropathy, unspecified: Secondary | ICD-10-CM | POA: Diagnosis not present

## 2019-06-08 DIAGNOSIS — Z79899 Other long term (current) drug therapy: Secondary | ICD-10-CM | POA: Diagnosis not present

## 2019-06-08 DIAGNOSIS — Z794 Long term (current) use of insulin: Secondary | ICD-10-CM | POA: Diagnosis not present

## 2019-06-08 DIAGNOSIS — K219 Gastro-esophageal reflux disease without esophagitis: Secondary | ICD-10-CM | POA: Diagnosis not present

## 2019-06-08 DIAGNOSIS — Z8673 Personal history of transient ischemic attack (TIA), and cerebral infarction without residual deficits: Secondary | ICD-10-CM | POA: Diagnosis not present

## 2019-06-08 DIAGNOSIS — E119 Type 2 diabetes mellitus without complications: Secondary | ICD-10-CM | POA: Diagnosis not present

## 2019-06-08 DIAGNOSIS — K625 Hemorrhage of anus and rectum: Secondary | ICD-10-CM | POA: Diagnosis not present

## 2019-06-08 DIAGNOSIS — Z905 Acquired absence of kidney: Secondary | ICD-10-CM | POA: Diagnosis not present

## 2019-06-08 DIAGNOSIS — Z90710 Acquired absence of both cervix and uterus: Secondary | ICD-10-CM | POA: Diagnosis not present

## 2019-06-08 DIAGNOSIS — Z9049 Acquired absence of other specified parts of digestive tract: Secondary | ICD-10-CM | POA: Diagnosis not present

## 2019-06-08 DIAGNOSIS — K635 Polyp of colon: Secondary | ICD-10-CM | POA: Diagnosis not present

## 2019-06-08 DIAGNOSIS — Z7982 Long term (current) use of aspirin: Secondary | ICD-10-CM | POA: Diagnosis not present

## 2019-06-08 DIAGNOSIS — K5730 Diverticulosis of large intestine without perforation or abscess without bleeding: Secondary | ICD-10-CM | POA: Diagnosis not present

## 2019-06-08 DIAGNOSIS — D122 Benign neoplasm of ascending colon: Secondary | ICD-10-CM | POA: Diagnosis not present

## 2019-06-08 DIAGNOSIS — D126 Benign neoplasm of colon, unspecified: Secondary | ICD-10-CM | POA: Diagnosis not present

## 2019-06-08 DIAGNOSIS — I10 Essential (primary) hypertension: Secondary | ICD-10-CM | POA: Diagnosis not present

## 2019-06-09 DIAGNOSIS — F331 Major depressive disorder, recurrent, moderate: Secondary | ICD-10-CM | POA: Diagnosis not present

## 2019-06-09 DIAGNOSIS — E7849 Other hyperlipidemia: Secondary | ICD-10-CM | POA: Diagnosis not present

## 2019-06-13 DIAGNOSIS — D519 Vitamin B12 deficiency anemia, unspecified: Secondary | ICD-10-CM | POA: Diagnosis not present

## 2019-06-13 DIAGNOSIS — D649 Anemia, unspecified: Secondary | ICD-10-CM | POA: Diagnosis not present

## 2019-06-13 DIAGNOSIS — D529 Folate deficiency anemia, unspecified: Secondary | ICD-10-CM | POA: Diagnosis not present

## 2019-06-15 ENCOUNTER — Ambulatory Visit (INDEPENDENT_AMBULATORY_CARE_PROVIDER_SITE_OTHER): Payer: Medicare Other | Admitting: Gastroenterology

## 2019-06-21 DIAGNOSIS — K5732 Diverticulitis of large intestine without perforation or abscess without bleeding: Secondary | ICD-10-CM | POA: Diagnosis not present

## 2019-06-21 DIAGNOSIS — K625 Hemorrhage of anus and rectum: Secondary | ICD-10-CM | POA: Diagnosis not present

## 2019-06-21 DIAGNOSIS — K439 Ventral hernia without obstruction or gangrene: Secondary | ICD-10-CM | POA: Diagnosis not present

## 2019-06-22 DIAGNOSIS — Z6830 Body mass index (BMI) 30.0-30.9, adult: Secondary | ICD-10-CM | POA: Diagnosis not present

## 2019-06-22 DIAGNOSIS — K5792 Diverticulitis of intestine, part unspecified, without perforation or abscess without bleeding: Secondary | ICD-10-CM | POA: Diagnosis not present

## 2019-06-22 DIAGNOSIS — R1032 Left lower quadrant pain: Secondary | ICD-10-CM | POA: Diagnosis not present

## 2019-07-07 DIAGNOSIS — N184 Chronic kidney disease, stage 4 (severe): Secondary | ICD-10-CM | POA: Diagnosis not present

## 2019-07-07 DIAGNOSIS — I129 Hypertensive chronic kidney disease with stage 1 through stage 4 chronic kidney disease, or unspecified chronic kidney disease: Secondary | ICD-10-CM | POA: Diagnosis not present

## 2019-07-07 DIAGNOSIS — E1122 Type 2 diabetes mellitus with diabetic chronic kidney disease: Secondary | ICD-10-CM | POA: Diagnosis not present

## 2019-07-07 DIAGNOSIS — E7849 Other hyperlipidemia: Secondary | ICD-10-CM | POA: Diagnosis not present

## 2019-07-11 ENCOUNTER — Other Ambulatory Visit: Payer: Self-pay | Admitting: Urology

## 2019-07-11 DIAGNOSIS — C649 Malignant neoplasm of unspecified kidney, except renal pelvis: Secondary | ICD-10-CM

## 2019-07-18 DIAGNOSIS — Z23 Encounter for immunization: Secondary | ICD-10-CM | POA: Diagnosis not present

## 2019-07-25 ENCOUNTER — Encounter (INDEPENDENT_AMBULATORY_CARE_PROVIDER_SITE_OTHER): Payer: Self-pay | Admitting: Gastroenterology

## 2019-08-03 ENCOUNTER — Ambulatory Visit
Admission: RE | Admit: 2019-08-03 | Discharge: 2019-08-03 | Disposition: A | Discharge status: Home / Self Care | Payer: Medicare Other | Source: Ambulatory Visit | Attending: Urology | Admitting: Urology

## 2019-08-03 ENCOUNTER — Other Ambulatory Visit: Payer: Self-pay

## 2019-08-03 DIAGNOSIS — Z905 Acquired absence of kidney: Secondary | ICD-10-CM | POA: Diagnosis not present

## 2019-08-03 DIAGNOSIS — Z23 Encounter for immunization: Secondary | ICD-10-CM | POA: Diagnosis not present

## 2019-08-03 DIAGNOSIS — C649 Malignant neoplasm of unspecified kidney, except renal pelvis: Secondary | ICD-10-CM

## 2019-08-03 MED ORDER — GADOBENATE DIMEGLUMINE 529 MG/ML IV SOLN
8.0000 mL | Freq: Once | INTRAVENOUS | Status: AC | PRN
  Administered 2019-08-03: 8 mL via INTRAVENOUS

## 2019-08-09 DIAGNOSIS — E7849 Other hyperlipidemia: Secondary | ICD-10-CM | POA: Diagnosis not present

## 2019-08-09 DIAGNOSIS — I10 Essential (primary) hypertension: Secondary | ICD-10-CM | POA: Diagnosis not present

## 2019-08-15 DIAGNOSIS — C642 Malignant neoplasm of left kidney, except renal pelvis: Secondary | ICD-10-CM | POA: Diagnosis not present

## 2019-08-15 DIAGNOSIS — D4101 Neoplasm of uncertain behavior of right kidney: Secondary | ICD-10-CM | POA: Diagnosis not present

## 2019-09-08 DIAGNOSIS — I10 Essential (primary) hypertension: Secondary | ICD-10-CM | POA: Diagnosis not present

## 2019-09-08 DIAGNOSIS — E7849 Other hyperlipidemia: Secondary | ICD-10-CM | POA: Diagnosis not present

## 2019-09-15 DIAGNOSIS — K5732 Diverticulitis of large intestine without perforation or abscess without bleeding: Secondary | ICD-10-CM | POA: Diagnosis not present

## 2019-09-27 DIAGNOSIS — I10 Essential (primary) hypertension: Secondary | ICD-10-CM | POA: Diagnosis not present

## 2019-09-27 DIAGNOSIS — E1142 Type 2 diabetes mellitus with diabetic polyneuropathy: Secondary | ICD-10-CM | POA: Diagnosis not present

## 2019-09-27 DIAGNOSIS — E871 Hypo-osmolality and hyponatremia: Secondary | ICD-10-CM | POA: Diagnosis not present

## 2019-09-27 DIAGNOSIS — N184 Chronic kidney disease, stage 4 (severe): Secondary | ICD-10-CM | POA: Diagnosis not present

## 2019-09-27 DIAGNOSIS — E782 Mixed hyperlipidemia: Secondary | ICD-10-CM | POA: Diagnosis not present

## 2019-09-27 DIAGNOSIS — E1165 Type 2 diabetes mellitus with hyperglycemia: Secondary | ICD-10-CM | POA: Diagnosis not present

## 2019-09-27 DIAGNOSIS — R5383 Other fatigue: Secondary | ICD-10-CM | POA: Diagnosis not present

## 2019-09-27 DIAGNOSIS — K219 Gastro-esophageal reflux disease without esophagitis: Secondary | ICD-10-CM | POA: Diagnosis not present

## 2019-10-02 DIAGNOSIS — F331 Major depressive disorder, recurrent, moderate: Secondary | ICD-10-CM | POA: Diagnosis not present

## 2019-10-02 DIAGNOSIS — E1122 Type 2 diabetes mellitus with diabetic chronic kidney disease: Secondary | ICD-10-CM | POA: Diagnosis not present

## 2019-10-02 DIAGNOSIS — I10 Essential (primary) hypertension: Secondary | ICD-10-CM | POA: Diagnosis not present

## 2019-10-02 DIAGNOSIS — D500 Iron deficiency anemia secondary to blood loss (chronic): Secondary | ICD-10-CM | POA: Diagnosis not present

## 2019-10-02 DIAGNOSIS — E782 Mixed hyperlipidemia: Secondary | ICD-10-CM | POA: Diagnosis not present

## 2019-10-02 DIAGNOSIS — K219 Gastro-esophageal reflux disease without esophagitis: Secondary | ICD-10-CM | POA: Diagnosis not present

## 2019-10-02 DIAGNOSIS — Z1331 Encounter for screening for depression: Secondary | ICD-10-CM | POA: Diagnosis not present

## 2019-10-02 DIAGNOSIS — Z1389 Encounter for screening for other disorder: Secondary | ICD-10-CM | POA: Diagnosis not present

## 2019-10-30 ENCOUNTER — Other Ambulatory Visit (INDEPENDENT_AMBULATORY_CARE_PROVIDER_SITE_OTHER): Payer: Self-pay | Admitting: *Deleted

## 2019-10-30 ENCOUNTER — Encounter (INDEPENDENT_AMBULATORY_CARE_PROVIDER_SITE_OTHER): Payer: Self-pay | Admitting: *Deleted

## 2019-10-30 ENCOUNTER — Ambulatory Visit (INDEPENDENT_AMBULATORY_CARE_PROVIDER_SITE_OTHER): Payer: Medicare Other | Admitting: Gastroenterology

## 2019-10-30 ENCOUNTER — Encounter (INDEPENDENT_AMBULATORY_CARE_PROVIDER_SITE_OTHER): Payer: Self-pay | Admitting: Gastroenterology

## 2019-10-30 ENCOUNTER — Other Ambulatory Visit: Payer: Self-pay

## 2019-10-30 VITALS — BP 136/79 | HR 79 | Temp 99.2°F | Wt 182.5 lb

## 2019-10-30 DIAGNOSIS — K219 Gastro-esophageal reflux disease without esophagitis: Secondary | ICD-10-CM

## 2019-10-30 DIAGNOSIS — R110 Nausea: Secondary | ICD-10-CM

## 2019-10-30 DIAGNOSIS — E1122 Type 2 diabetes mellitus with diabetic chronic kidney disease: Secondary | ICD-10-CM

## 2019-10-30 DIAGNOSIS — R1310 Dysphagia, unspecified: Secondary | ICD-10-CM

## 2019-10-30 DIAGNOSIS — N183 Chronic kidney disease, stage 3 unspecified: Secondary | ICD-10-CM

## 2019-10-30 DIAGNOSIS — Z794 Long term (current) use of insulin: Secondary | ICD-10-CM

## 2019-10-30 NOTE — Progress Notes (Signed)
Patient profile: Angela Harris is a 74 y.o. female seen for evaluation of diverticulitis. Last seen in clinic 05/2018.     History of Present Illness: Angela Harris reports rectal bleeding in Dec 2020, had colonoscopy in Jan 2021 w/ Dr Ladona Horns for evaluation and has not had bleeding since.    Does report today nausea ongoing at least a few months, also reports belching. Eating doesn't affect the nausea which is morst prominent in mornings. Nausea can last all day or resolve within a few hours. Rare GERD symptoms w/ nausa. Also reports dysphagia in lower esopahgeal area, can be all substances, tries to eat slowly to avoid. No dysphagia to pills. Rare liquid dyspahgia.    She denies any lower abd pain. Moving her stools daily without constipation/diarrhea. Has not had further bleeding since colonoscopy or dark stools. Does report diverticulitis in March 2021, symptoms resolved w/ antibiotic treatment.  Non smoker, no alcohol, no nsaids.   Wt Readings from Last 3 Encounters:  10/30/19 182 lb 8 oz (82.8 kg)  05/31/18 184 lb (83.5 kg)  01/14/18 183 lb (83 kg)     Last Colonoscopy: 2018-normal   05/2019-multiple polyps 3-40mm ascending, 32mm polyp rectosigmoid, 5mm polyp at 40cm. Moderate diverticulosis sigmoid.   Last Endoscopy:    Past Medical History:  Past Medical History:  Diagnosis Date   Anemia    CKD (chronic kidney disease) stage 3, GFR 30-59 ml/min    Depression    Dysrhythmia    LBBB   Essential hypertension    GERD (gastroesophageal reflux disease)    History of TIA (transient ischemic attack) 2015   Hyperlipidemia    Meniere disease    pt denies   Peripheral neuropathy    PONV (postoperative nausea and vomiting)    Stroke ([place]) [address]   "mini-stroke". No deficits from this   Type 2 diabetes mellitus ([place])     Problem List: Patient Active Problem List   Diagnosis Date Noted   Diverticulitis of colon 06/06/2018   Change in stool  06/06/2018   Renal mass 06/18/2017   Trigger ring finger of right hand 04/12/2017   Carpal tunnel syndrome of right wrist    Closed fracture of lower end of right radius with malunion    Type 2 diabetes mellitus with stage 3 chronic kidney disease, with long-term current use of insulin ([place]) 01/06/2016   Type 2 diabetes mellitus with vascular disease ([place]) 11/06/2015   Hyperlipidemia 11/06/2015   Essential hypertension, benign 11/06/2015    Past Surgical History: Past Surgical History:  Procedure Laterality Date   ABDOMINAL HYSTERECTOMY     BACK SURGERY     ruptured disc lower back   CARPAL TUNNEL RELEASE Right 08/20/2016   Procedure: CARPAL TUNNEL RELEASE;  Surgeon: Carole Civil, MD;  Location: [facility];  Service: Orthopedics;  Laterality: Right;   CATARACT EXTRACTION, BILATERAL     CESAREAN SECTION     CHOLECYSTECTOMY     Closed reduction right wrist fracture Right    ECTOPIC PREGNANCY SURGERY     ORIF WRIST FRACTURE Right 02/26/2016   Procedure: OPEN REDUCTION INTERNAL FIXATION (ORIF) WRIST FRACTURE;  Surgeon: Carole Civil, MD;  Location: [facility];  Service: Orthopedics;  Laterality: Right;  TIME 12:30   ROBOTIC ASSITED PARTIAL NEPHRECTOMY Left 06/18/2017   Procedure: XI ROBOTIC ASSITED PARTIAL NEPHRECTOMY;  Surgeon: Alexis Frock, MD;  Location: [facility];  Service: Urology;  Laterality: Left;   TONSILLECTOMY      Allergies:  No Known Allergies    Home Medications:  Current Outpatient Medications:    acetaminophen (TYLENOL) 500 MG tablet, Take 1,000 mg by mouth every 6 (six) hours as needed for mild pain., Disp: , Rfl:    amLODipine (NORVASC) 10 MG tablet, Take 10 mg by mouth daily., Disp: , Rfl:    aspirin EC 81 MG tablet, Take 81 mg by mouth daily., Disp: , Rfl:    atenolol (TENORMIN) 50 MG tablet, Take 50 mg by mouth daily., Disp: , Rfl:    Calcium Carb-Cholecalciferol [phone] MG-UNIT CAPS, Take by mouth daily. , Disp: , Rfl:    gabapentin  (NEURONTIN) 300 MG capsule, Take 300 mg by mouth 2 (two) times daily., Disp: , Rfl:    insulin degludec (TRESIBA FLEXTOUCH) 200 UNIT/ML FlexTouch Pen, Inject 90 Units into the skin daily., Disp: , Rfl:    lisinopril (PRINIVIL,ZESTRIL) 40 MG tablet, Take 40 mg by mouth daily., Disp: , Rfl:    Multiple Vitamins-Minerals (MULTIVITAMIN ADULTS PO), Take by mouth daily., Disp: , Rfl:    ONE TOUCH ULTRA TEST test strip, USE ONE STRIP TO CHECK GLUCOSE 4 TIMES DAILY, Disp: 150 each, Rfl: 5   pantoprazole (PROTONIX) 40 MG tablet, Take 40 mg by mouth 2 (two) times daily. , Disp: , Rfl:    simvastatin (ZOCOR) 40 MG tablet, Take 40 mg by mouth at bedtime. , Disp: , Rfl:    venlafaxine XR (EFFEXOR-XR) 75 MG 24 hr capsule, Take 75 mg by mouth daily with breakfast., Disp: , Rfl:    Family History: family history includes Alcoholism in her brother, father, and sister; Alzheimer's disease in her mother; Hypertension in her brother, father, mother, and sister; Thyroid disease in her mother.    Social History:   reports that she has never smoked. She has never used smokeless tobacco. She reports that she does not drink alcohol and does not use drugs.   Review of Systems: Constitutional: Denies weight loss/weight gain  Eyes: No changes in vision. ENT: No oral lesions, sore throat.  GI: see HPI.  Heme/Lymph: No easy bruising.  CV: No chest pain.  GU: No hematuria.  Integumentary: No rashes.  Neuro: No headaches.  Psych: No depression/anxiety.  Endocrine: No heat/cold intolerance.  Allergic/Immunologic: No urticaria.  Resp: No cough, SOB.  Musculoskeletal: No joint swelling.    Physical Examination: BP 136/79 (BP Location: Right Arm, Patient Position: Sitting, Cuff Size: Large)    Pulse 79    Temp 99.2 F (37.3 C) (Oral)    Wt 182 lb 8 oz (82.8 kg)    BMI 31.33 kg/m  Gen: NAD, alert and oriented x 4 HEENT: PEERLA, EOMI, Neck: supple, no JVD Chest: CTA bilaterally, no wheezes, crackles, or other  adventitious sounds CV: RRR, no m/g/c/r Abd: soft, NT, ND, +BS in all four quadrants; no HSM, guarding, ridigity, or rebound tenderness Ext: no edema, well perfused with 2+ pulses, Skin: no rash or lesions noted on observed skin Lymph: no noted LAD  Data Reviewed:   MRCP 07/2019-IMPRESSION: 1. Status post superior pole partial left nephrectomy without evidence of recurrent or metastatic disease in the abdomen. 2. Subcentimeter fluid signal lesions in the right kidney, likely simple cysts, without evidence of solid mass or suspicious contrast enhancement. 3.  Status post cholecystectomy.  Labs 06/2019-low % sat, normal Hgb,   Assessment/Plan: Ms. Lozoya is a 74 y.o. female seen today for GERD and dysphagia.  We will schedule endoscopy.  She also has some mild nonspecific nausea which  she will keep a journal to look for triggers (also has DM so consider gastroparesis).  She is on PPI twice daily.  Will check basic labs with CMP and thyroid.  She had a recent CBC without anemia (Hgb 13.9).  2.  Diverticulitis-multiple episodes in past several years.  Uncomplicated-last episode March 2021 resolved quickly with antibiotics. continue adequate fiber and fluid intake.  If symptoms become more frequent would consider surgical resection  Patient denies CP, SOB, and use of blood thinners. I discussed the risks and benefits of procedure including bleeding, perforation, infection, missed lesions, medication reactions and possible hospitalization or surgery if complications. All questions answered.    Sarye was seen today for follow-up.  Diagnoses and all orders for this visit:  Chronic GERD -     TSH + free T4 -     COMPLETE METABOLIC PANEL WITH GFR  Nausea without vomiting -     TSH + free T4 -     COMPLETE METABOLIC PANEL WITH GFR  Dysphagia, unspecified type  Type 2 diabetes mellitus with stage 3 chronic kidney disease, with long-term current use of insulin, unspecified whether  stage 3a or 3b CKD ([place])       I personally performed the service, non-incident to. (WP)  Laurine Blazer, [facility]

## 2019-10-30 NOTE — Patient Instructions (Addendum)
Scheduling endoscopy for evaluation and checking labs today.  Keep nausea journal

## 2019-10-31 ENCOUNTER — Other Ambulatory Visit (HOSPITAL_COMMUNITY)
Admission: RE | Admit: 2019-10-31 | Discharge: 2019-10-31 | Disposition: A | Discharge status: Home / Self Care | Payer: Medicare Other | Source: Ambulatory Visit | Attending: Internal Medicine | Admitting: Internal Medicine

## 2019-10-31 DIAGNOSIS — Z01812 Encounter for preprocedural laboratory examination: Secondary | ICD-10-CM | POA: Diagnosis present

## 2019-10-31 DIAGNOSIS — Z20822 Contact with and (suspected) exposure to covid-19: Secondary | ICD-10-CM | POA: Diagnosis not present

## 2019-10-31 LAB — COMPLETE METABOLIC PANEL WITH GFR
AG Ratio: 2 (calc) (ref 1.0–2.5)
ALT: 25 U/L (ref 6–29)
AST: 25 U/L (ref 10–35)
Albumin: 4.3 g/dL (ref 3.6–5.1)
Alkaline phosphatase (APISO): 92 U/L (ref 37–153)
BUN/Creatinine Ratio: 13 (calc) (ref 6–22)
BUN: 17 mg/dL (ref 7–25)
CO2: 25 mmol/L (ref 20–32)
Calcium: 9.7 mg/dL (ref 8.6–10.4)
Chloride: 103 mmol/L (ref 98–110)
Creat: 1.31 mg/dL — ABNORMAL HIGH (ref 0.60–0.93)
GFR, Est African American: 47 mL/min/{1.73_m2} — ABNORMAL LOW (ref >=60)
GFR, Est Non African American: 41 mL/min/{1.73_m2} — ABNORMAL LOW (ref >=60)
Globulin: 2.1 g/dL (calc) (ref 1.9–3.7)
Glucose, Bld: 82 mg/dL (ref 65–139)
Potassium: 3.8 mmol/L (ref 3.5–5.3)
Sodium: 140 mmol/L (ref 135–146)
Total Bilirubin: 0.5 mg/dL (ref 0.2–1.2)
Total Protein: 6.4 g/dL (ref 6.1–8.1)

## 2019-10-31 LAB — SARS CORONAVIRUS 2 (TAT 6-24 HRS): SARS Coronavirus 2: NEGATIVE

## 2019-10-31 LAB — TSH+FREE T4: TSH W/REFLEX TO FT4: 2.35 mIU/L (ref 0.40–4.50)

## 2019-11-02 ENCOUNTER — Encounter (HOSPITAL_COMMUNITY)
Admission: RE | Disposition: A | Discharge status: Home / Self Care | Payer: Self-pay | Source: Home / Self Care | Attending: Internal Medicine

## 2019-11-02 ENCOUNTER — Encounter (HOSPITAL_COMMUNITY): Payer: Self-pay | Admitting: Internal Medicine

## 2019-11-02 ENCOUNTER — Other Ambulatory Visit: Payer: Self-pay

## 2019-11-02 ENCOUNTER — Ambulatory Visit (HOSPITAL_COMMUNITY)
Admission: RE | Admit: 2019-11-02 | Discharge: 2019-11-02 | Disposition: A | Discharge status: Home / Self Care | Payer: Medicare Other | Attending: Internal Medicine | Admitting: Internal Medicine

## 2019-11-02 DIAGNOSIS — Z905 Acquired absence of kidney: Secondary | ICD-10-CM | POA: Insufficient documentation

## 2019-11-02 DIAGNOSIS — K219 Gastro-esophageal reflux disease without esophagitis: Secondary | ICD-10-CM

## 2019-11-02 DIAGNOSIS — Z8673 Personal history of transient ischemic attack (TIA), and cerebral infarction without residual deficits: Secondary | ICD-10-CM | POA: Insufficient documentation

## 2019-11-02 DIAGNOSIS — E1142 Type 2 diabetes mellitus with diabetic polyneuropathy: Secondary | ICD-10-CM | POA: Diagnosis not present

## 2019-11-02 DIAGNOSIS — K2970 Gastritis, unspecified, without bleeding: Secondary | ICD-10-CM | POA: Insufficient documentation

## 2019-11-02 DIAGNOSIS — F329 Major depressive disorder, single episode, unspecified: Secondary | ICD-10-CM | POA: Insufficient documentation

## 2019-11-02 DIAGNOSIS — K317 Polyp of stomach and duodenum: Secondary | ICD-10-CM | POA: Diagnosis not present

## 2019-11-02 DIAGNOSIS — Z8249 Family history of ischemic heart disease and other diseases of the circulatory system: Secondary | ICD-10-CM | POA: Diagnosis not present

## 2019-11-02 DIAGNOSIS — N183 Chronic kidney disease, stage 3 unspecified: Secondary | ICD-10-CM | POA: Insufficient documentation

## 2019-11-02 DIAGNOSIS — E1122 Type 2 diabetes mellitus with diabetic chronic kidney disease: Secondary | ICD-10-CM | POA: Diagnosis not present

## 2019-11-02 DIAGNOSIS — K259 Gastric ulcer, unspecified as acute or chronic, without hemorrhage or perforation: Secondary | ICD-10-CM | POA: Diagnosis not present

## 2019-11-02 DIAGNOSIS — R1314 Dysphagia, pharyngoesophageal phase: Secondary | ICD-10-CM | POA: Diagnosis present

## 2019-11-02 DIAGNOSIS — Z794 Long term (current) use of insulin: Secondary | ICD-10-CM | POA: Insufficient documentation

## 2019-11-02 DIAGNOSIS — E785 Hyperlipidemia, unspecified: Secondary | ICD-10-CM | POA: Diagnosis not present

## 2019-11-02 DIAGNOSIS — I129 Hypertensive chronic kidney disease with stage 1 through stage 4 chronic kidney disease, or unspecified chronic kidney disease: Secondary | ICD-10-CM | POA: Diagnosis not present

## 2019-11-02 DIAGNOSIS — R110 Nausea: Secondary | ICD-10-CM

## 2019-11-02 DIAGNOSIS — I447 Left bundle-branch block, unspecified: Secondary | ICD-10-CM | POA: Diagnosis not present

## 2019-11-02 DIAGNOSIS — Z7982 Long term (current) use of aspirin: Secondary | ICD-10-CM | POA: Insufficient documentation

## 2019-11-02 DIAGNOSIS — K449 Diaphragmatic hernia without obstruction or gangrene: Secondary | ICD-10-CM

## 2019-11-02 DIAGNOSIS — Z79899 Other long term (current) drug therapy: Secondary | ICD-10-CM | POA: Diagnosis not present

## 2019-11-02 DIAGNOSIS — R1310 Dysphagia, unspecified: Secondary | ICD-10-CM

## 2019-11-02 HISTORY — PX: BIOPSY: SHX5522

## 2019-11-02 HISTORY — PX: ESOPHAGEAL DILATION: SHX303

## 2019-11-02 HISTORY — PX: ESOPHAGOGASTRODUODENOSCOPY: SHX5428

## 2019-11-02 LAB — GLUCOSE, CAPILLARY
Glucose-Capillary: 73 mg/dL (ref 70–99)
Glucose-Capillary: 105 mg/dL — ABNORMAL HIGH (ref 70–99)

## 2019-11-02 SURGERY — EGD (ESOPHAGOGASTRODUODENOSCOPY)
Anesthesia: Moderate Sedation

## 2019-11-02 MED ORDER — MEPERIDINE HCL 50 MG/ML IJ SOLN
INTRAMUSCULAR | Status: AC
  Filled 2019-11-02: qty 1

## 2019-11-02 MED ORDER — MIDAZOLAM HCL 5 MG/5ML IJ SOLN
INTRAMUSCULAR | Status: DC | PRN
  Administered 2019-11-02: 1 mg via INTRAVENOUS
  Administered 2019-11-02 (×2): 2 mg via INTRAVENOUS
  Administered 2019-11-02: 1 mg via INTRAVENOUS

## 2019-11-02 MED ORDER — MIDAZOLAM HCL 5 MG/5ML IJ SOLN
INTRAMUSCULAR | Status: AC
  Filled 2019-11-02: qty 10

## 2019-11-02 MED ORDER — STERILE WATER FOR IRRIGATION IR SOLN
Status: DC | PRN
  Administered 2019-11-02: 1.5 mL

## 2019-11-02 MED ORDER — ONDANSETRON HCL 4 MG PO TABS: 4.0000 mg | ORAL_TABLET | Freq: Two times a day (BID) | ORAL | 0 refills | Status: DC | PRN

## 2019-11-02 MED ORDER — LIDOCAINE VISCOUS HCL 2 % MT SOLN
OROMUCOSAL | Status: AC
  Filled 2019-11-02: qty 15

## 2019-11-02 MED ORDER — LIDOCAINE VISCOUS HCL 2 % MT SOLN
OROMUCOSAL | Status: DC | PRN
  Administered 2019-11-02: 4 mL via OROMUCOSAL

## 2019-11-02 MED ORDER — SODIUM CHLORIDE 0.9 % IV SOLN: INTRAVENOUS | Status: DC

## 2019-11-02 MED ORDER — MEPERIDINE HCL 50 MG/ML IJ SOLN
INTRAMUSCULAR | Status: DC | PRN
  Administered 2019-11-02 (×2): 25 mg via INTRAVENOUS

## 2019-11-02 MED ORDER — DEXTROSE-NACL 5-0.9 % IV SOLN: INTRAVENOUS | Status: DC

## 2019-11-02 NOTE — Discharge Instructions (Signed)
Resume aspirin on 11/05/2019.  Do not take OTC NSAIDs such as Advil or Aleve. Resume other medications as before. Resume usual diet. No driving for 24 hours. Physician will call with biopsy results and further recommendations.     Upper Endoscopy, Adult, Care After This sheet gives you information about how to care for yourself after your procedure. Your health care provider may also give you more specific instructions. If you have problems or questions, contact your health care provider. What can I expect after the procedure? After the procedure, it is common to have:  A sore throat.  Mild stomach pain or discomfort.  Bloating.  Nausea. Follow these instructions at home:   Follow instructions from your health care provider about what to eat or drink after your procedure.  Return to your normal activities as told by your health care provider. Ask your health care provider what activities are safe for you.  Take over-the-counter and prescription medicines only as told by your health care provider.  Do not drive for 24 hours if you were given a sedative during your procedure.  Keep all follow-up visits as told by your health care provider. This is important. Contact a health care provider if you have:  A sore throat that lasts longer than one day.  Trouble swallowing. Get help right away if:  You vomit blood or your vomit looks like coffee grounds.  You have: ? A fever. ? Bloody, black, or tarry stools. ? A severe sore throat or you cannot swallow. ? Difficulty breathing. ? Severe pain in your chest or abdomen. Summary  After the procedure, it is common to have a sore throat, mild stomach discomfort, bloating, and nausea.  Do not drive for 24 hours if you were given a sedative during the procedure.  Follow instructions from your health care provider about what to eat or drink after your procedure.  Return to your normal activities as told by your health care  provider. This information is not intended to replace advice given to you by your health care provider. Make sure you discuss any questions you have with your health care provider. Document Revised: 10/19/2017 Document Reviewed: 09/27/2017 Elsevier Patient Education  [place].

## 2019-11-02 NOTE — Op Note (Signed)
[facility] Patient Name: Angela Harris Procedure Date: 11/02/2019 12:58 PM MRN: [id number] Date of Birth: 02-17-1946 Attending MD: Hildred Laser , MD CSN: [id number] Age: 74 Admit Type: Outpatient Procedure:                Upper GI endoscopy Indications:              Esophageal dysphagia, Nausea Providers:                Hildred Laser, MD, Otis Peak B. Sharon Seller, RN, Randa Spike, Technician Referring MD:             Mitzie Na. Quillian Quince, MD Medicines:                Lidocaine spray, Meperidine 50 mg IV, Midazolam 6                            mg IV Complications:            No immediate complications. Estimated Blood Loss:     Estimated blood loss was minimal. Procedure:                Pre-Anesthesia Assessment:                           - Prior to the procedure, a History and Physical                            was performed, and patient medications and                            allergies were reviewed. The patient's tolerance of                            previous anesthesia was also reviewed. The risks                            and benefits of the procedure and the sedation                            options and risks were discussed with the patient.                            All questions were answered, and informed consent                            was obtained. Prior Anticoagulants: The patient has                            taken no previous anticoagulant or antiplatelet                            agents except for aspirin. ASA Grade Assessment:  III - A patient with severe systemic disease. After                            reviewing the risks and benefits, the patient was                            deemed in satisfactory condition to undergo the                            procedure.                           After obtaining informed consent, the endoscope was                            passed under direct vision.  Throughout the                            procedure, the patient's blood pressure, pulse, and                            oxygen saturations were monitored continuously. The                            GIF-H190 ([id number]) scope was introduced through the                            mouth, and advanced to the second part of duodenum.                            The upper GI endoscopy was accomplished without                            difficulty. The patient tolerated the procedure                            well. Scope In: 1:37:01 PM Scope Out: 1:53:32 PM Total Procedure Duration: 0 hours 16 minutes 31 seconds  Findings:      The hypopharynx was normal.      The examined esophagus was normal.      The Z-line was regular and was found 39 cm from the incisors.      No endoscopic abnormality was evident in the esophagus to explain the       patient's complaint of dysphagia. It was decided, however, to proceed       with dilation of the entire esophagus. The scope was withdrawn. Dilation       was performed with a Maloney dilator with no resistance at 61 Fr. The       dilation site was examined following endoscope reinsertion and showed no       change and no bleeding, mucosal tear or perforation.      A single 8 to 20 mm semi-sessile polyp with no bleeding and no stigmata       of recent bleeding was found in the gastric body and on the posterior  wall of the stomach. Biopsies were taken with a cold forceps for       histology.      Two 8 to 10 mm semi-sessile polyps with no bleeding and stigmata of       recent bleeding were found in the cardia and in the gastric fundus.      Patchy mild inflammation characterized by congestion (edema), erosions       and erythema was found in the gastric antrum.      The duodenal bulb and second portion of the duodenum were normal. Impression:               - Normal hypopharynx.                           - Normal esophagus.                           -  Z-line regular, 39 cm from the incisors.                           - No endoscopic esophageal abnormality to explain                            patient's dysphagia. Esophagus dilated. Dilated.                           - A single gastric polyp. Biopsied.                           - Two gastric polyps.                           - Gastritis.                           - Normal duodenal bulb and second portion of the                            duodenum. Moderate Sedation:      Moderate (conscious) sedation was administered by the endoscopy nurse       and supervised by the endoscopist. The following parameters were       monitored: oxygen saturation, heart rate, blood pressure, CO2       capnography and response to care. Total physician intraservice time was       21 minutes. Recommendation:           - Resume previous diet today.                           - Continue present medications.                           - No aspirin, ibuprofen, naproxen, or other                            non-steroidal anti-inflammatory drugs for 3 days.                           -  Await pathology results.                           - Repeat upper endoscopy with polypectomy at                            appointment to be scheduled under [facility]. Procedure Code(s):        --- Professional ---                           [phone], Esophagogastroduodenoscopy, flexible,                            transoral; with biopsy, single or multiple                           43450, Dilation of esophagus, by unguided sound or                            bougie, single or multiple passes                           G0500, Moderate sedation services provided by the                            same physician or other qualified health care                            professional performing a gastrointestinal                            endoscopic service that sedation supports,                            requiring the presence of an independent  trained                            observer to assist in the monitoring of the                            patient's level of consciousness and physiological                            status; initial 15 minutes of intra-service time;                            patient age 34 years or older (additional time may                            be reported with [phone], as appropriate) Diagnosis Code(s):        --- Professional ---                           K31.7, Polyp of stomach and duodenum  K29.70, Gastritis, unspecified, without bleeding                           R13.14, Dysphagia, pharyngoesophageal phase                           R11.0, Nausea CPT copyright 2019 American Medical Association. All rights reserved. The codes documented in this report are preliminary and upon coder review may  be revised to meet current compliance requirements. Hildred Laser, MD Hildred Laser, MD 11/02/2019 2:11:39 PM This report has been signed electronically. Number of Addenda: 0

## 2019-11-02 NOTE — H&P (Signed)
Angela Harris is an 74 y.o. female.   Chief Complaint:  Patient is here for esophagogastroduodenoscopy with esophageal dilation. HPI: Patient is 74 year old Caucasian female with chronic GERD and satisfactory control of heartburn who presents with 85-month history of dysphagia which has brought to the solids and at times to liquids.  She points to lower sternal area site of bolus obstruction.  Food bolus eventually passes down.  She has not had any episode of food impaction or regurgitation.  She also complains of intermittent nausea.  Nausea is worse when she wakes up in the morning and actually gets better with meals.  She does vomit sporadically.  Last episode was 1 month ago.  She has good appetite and has not lost any weight.  She denies melena or rectal bleeding. Patient is on low-dose aspirin.  Past Medical History:  Diagnosis Date  . Anemia   . CKD (chronic kidney disease) stage 3, GFR 30-59 ml/min   . Depression   . Dysrhythmia    LBBB  . Essential hypertension   . GERD (gastroesophageal reflux disease)   . History of TIA (transient ischemic attack) 2015  . Hyperlipidemia   .       Marland Kitchen Peripheral neuropathy   . PONV (postoperative nausea and vomiting)   . Stroke [facility]) [address]   "mini-stroke". No deficits from this  . Type 2 diabetes mellitus ([place])     Past Surgical History:  Procedure Laterality Date  . ABDOMINAL HYSTERECTOMY    . BACK SURGERY     ruptured disc lower back  . CARPAL TUNNEL RELEASE Right 08/20/2016   Procedure: CARPAL TUNNEL RELEASE;  Surgeon: Carole Civil, MD;  Location: [facility];  Service: Orthopedics;  Laterality: Right;  . CATARACT EXTRACTION, BILATERAL    . CESAREAN SECTION    . CHOLECYSTECTOMY    . Closed reduction right wrist fracture Right   . ECTOPIC PREGNANCY SURGERY    . ORIF WRIST FRACTURE Right 02/26/2016   Procedure: OPEN REDUCTION INTERNAL FIXATION (ORIF) WRIST FRACTURE;  Surgeon: Carole Civil, MD;  Location: [facility];  Service:  Orthopedics;  Laterality: Right;  TIME 12:30  . ROBOTIC ASSITED PARTIAL NEPHRECTOMY Left 06/18/2017   Procedure: XI ROBOTIC ASSITED PARTIAL NEPHRECTOMY;  Surgeon: Alexis Frock, MD;  Location: [facility];  Service: Urology;  Laterality: Left;  . TONSILLECTOMY      Family History  Problem Relation Age of Onset  . Alzheimer's disease Mother   . Thyroid disease Mother   . Hypertension Mother   . Alcoholism Father   . Hypertension Father   . Alcoholism Sister   . Hypertension Sister   . Alcoholism Brother   . Hypertension Brother    Social History:  reports that she has never smoked. She has never used smokeless tobacco. She reports that she does not drink alcohol and does not use drugs.  Allergies: No Known Allergies  Medications Prior to Admission  Medication Sig Dispense Refill  . acetaminophen (TYLENOL) 500 MG tablet Take 1,000 mg by mouth every 6 (six) hours as needed for mild pain.    Marland Kitchen amLODipine (NORVASC) 10 MG tablet Take 10 mg by mouth daily.    Marland Kitchen aspirin EC 81 MG tablet Take 81 mg by mouth daily.    Marland Kitchen atenolol (TENORMIN) 50 MG tablet Take 50 mg by mouth daily.    . Calcium Carb-Cholecalciferol [phone] MG-UNIT CAPS Take 1 tablet by mouth daily.     Marland Kitchen gabapentin (NEURONTIN) 300 MG capsule Take 300  mg by mouth 2 (two) times daily.    . insulin degludec (TRESIBA FLEXTOUCH) 100 UNIT/ML FlexTouch Pen Inject 93 Units into the skin daily.     Marland Kitchen lisinopril (PRINIVIL,ZESTRIL) 40 MG tablet Take 40 mg by mouth daily.    . Multiple Vitamins-Minerals (MULTIVITAMIN ADULTS PO) Take 1 tablet by mouth daily. Woman    . pantoprazole (PROTONIX) 40 MG tablet Take 40 mg by mouth 2 (two) times daily.     . rosuvastatin (CRESTOR) 5 MG tablet Take 5 mg by mouth at bedtime.    Marland Kitchen venlafaxine XR (EFFEXOR-XR) 75 MG 24 hr capsule Take 75 mg by mouth daily with breakfast.    . ONE TOUCH ULTRA TEST test strip USE ONE STRIP TO CHECK GLUCOSE 4 TIMES DAILY 150 each 5    Results for orders placed or performed  during the hospital encounter of 11/02/19 (from the past 48 hour(s))  Glucose, capillary     Status: None   Collection Time: 11/02/19 12:45 PM  Result Value Ref Range   Glucose-Capillary 73 70 - 99 mg/dL    Comment: Glucose reference range applies only to samples taken after fasting for at least 8 hours.   No results found.  Review of Systems  Blood pressure (!) (P) 149/66, pulse (P) 68, resp. rate (P) 15, height (P) 5' 4.5" (1.638 m), weight (P) 81.6 kg, SpO2 (P) 98 %. Physical Exam  HENT:  Mouth/Throat: Mucous membranes are moist. Oropharynx is clear.  Eyes: Conjunctivae are normal. No scleral icterus.  Cardiovascular: Normal rate, regular rhythm and normal heart sounds.  No murmur heard. Respiratory: Effort normal and breath sounds normal.  GI:  Abdomen is full with laparoscopy scars across upper abdomen.  Also has Pfannenstiel scar.  Abdomen is soft and nontender with organomegaly or masses.  Musculoskeletal:        General: No swelling.     Cervical back: Neck supple.  Lymphadenopathy:    She has no cervical adenopathy.  Neurological: She is alert.  Skin: Skin is warm and dry.  Psychiatric: Mood normal.     Assessment/Plan Esophageal dysphagia in patient with chronic GERD. Nausea. Esophagogastroduodenoscopy with esophageal dilation.  Hildred Laser, MD 11/02/2019, 1:26 PM

## 2019-11-03 LAB — SURGICAL PATHOLOGY

## 2019-11-07 ENCOUNTER — Encounter (HOSPITAL_COMMUNITY): Payer: Self-pay | Admitting: Internal Medicine

## 2019-11-08 ENCOUNTER — Other Ambulatory Visit (INDEPENDENT_AMBULATORY_CARE_PROVIDER_SITE_OTHER): Payer: Self-pay | Admitting: *Deleted

## 2019-11-08 ENCOUNTER — Encounter (INDEPENDENT_AMBULATORY_CARE_PROVIDER_SITE_OTHER): Payer: Self-pay | Admitting: *Deleted

## 2019-11-08 DIAGNOSIS — K317 Polyp of stomach and duodenum: Secondary | ICD-10-CM

## 2019-11-22 ENCOUNTER — Other Ambulatory Visit (INDEPENDENT_AMBULATORY_CARE_PROVIDER_SITE_OTHER): Payer: Self-pay | Admitting: Internal Medicine

## 2019-12-08 DIAGNOSIS — N184 Chronic kidney disease, stage 4 (severe): Secondary | ICD-10-CM | POA: Diagnosis not present

## 2019-12-08 DIAGNOSIS — I129 Hypertensive chronic kidney disease with stage 1 through stage 4 chronic kidney disease, or unspecified chronic kidney disease: Secondary | ICD-10-CM | POA: Diagnosis not present

## 2019-12-08 DIAGNOSIS — E1122 Type 2 diabetes mellitus with diabetic chronic kidney disease: Secondary | ICD-10-CM | POA: Diagnosis not present

## 2019-12-08 DIAGNOSIS — K219 Gastro-esophageal reflux disease without esophagitis: Secondary | ICD-10-CM | POA: Diagnosis not present

## 2019-12-11 ENCOUNTER — Other Ambulatory Visit: Payer: Self-pay

## 2019-12-11 ENCOUNTER — Encounter (HOSPITAL_COMMUNITY)
Admission: RE | Admit: 2019-12-11 | Discharge: 2019-12-11 | Disposition: A | Discharge status: Home / Self Care | Payer: Medicare Other | Source: Ambulatory Visit | Attending: Internal Medicine | Admitting: Internal Medicine

## 2019-12-11 ENCOUNTER — Other Ambulatory Visit (HOSPITAL_COMMUNITY)
Admission: RE | Admit: 2019-12-11 | Discharge: 2019-12-11 | Disposition: A | Discharge status: Home / Self Care | Payer: Medicare Other | Source: Ambulatory Visit | Attending: Internal Medicine | Admitting: Internal Medicine

## 2019-12-11 DIAGNOSIS — Z20822 Contact with and (suspected) exposure to covid-19: Secondary | ICD-10-CM | POA: Diagnosis not present

## 2019-12-11 DIAGNOSIS — K317 Polyp of stomach and duodenum: Secondary | ICD-10-CM | POA: Diagnosis not present

## 2019-12-11 DIAGNOSIS — Z01812 Encounter for preprocedural laboratory examination: Secondary | ICD-10-CM | POA: Diagnosis not present

## 2019-12-12 LAB — SARS CORONAVIRUS 2 (TAT 6-24 HRS): SARS Coronavirus 2: NEGATIVE

## 2019-12-13 ENCOUNTER — Encounter (HOSPITAL_COMMUNITY)
Admission: RE | Disposition: A | Discharge status: Home / Self Care | Payer: Self-pay | Source: Home / Self Care | Attending: Internal Medicine

## 2019-12-13 ENCOUNTER — Ambulatory Visit (HOSPITAL_COMMUNITY)
Admission: RE | Admit: 2019-12-13 | Discharge: 2019-12-13 | Disposition: A | Discharge status: Home / Self Care | Payer: Medicare Other | Attending: Internal Medicine | Admitting: Internal Medicine

## 2019-12-13 ENCOUNTER — Ambulatory Visit (HOSPITAL_COMMUNITY): Payer: Medicare Other | Admitting: Anesthesiology

## 2019-12-13 DIAGNOSIS — I129 Hypertensive chronic kidney disease with stage 1 through stage 4 chronic kidney disease, or unspecified chronic kidney disease: Secondary | ICD-10-CM | POA: Insufficient documentation

## 2019-12-13 DIAGNOSIS — K219 Gastro-esophageal reflux disease without esophagitis: Secondary | ICD-10-CM | POA: Insufficient documentation

## 2019-12-13 DIAGNOSIS — E1122 Type 2 diabetes mellitus with diabetic chronic kidney disease: Secondary | ICD-10-CM | POA: Insufficient documentation

## 2019-12-13 DIAGNOSIS — E785 Hyperlipidemia, unspecified: Secondary | ICD-10-CM | POA: Insufficient documentation

## 2019-12-13 DIAGNOSIS — Z8249 Family history of ischemic heart disease and other diseases of the circulatory system: Secondary | ICD-10-CM | POA: Insufficient documentation

## 2019-12-13 DIAGNOSIS — K2970 Gastritis, unspecified, without bleeding: Secondary | ICD-10-CM | POA: Diagnosis not present

## 2019-12-13 DIAGNOSIS — F329 Major depressive disorder, single episode, unspecified: Secondary | ICD-10-CM | POA: Diagnosis not present

## 2019-12-13 DIAGNOSIS — Z7982 Long term (current) use of aspirin: Secondary | ICD-10-CM | POA: Diagnosis not present

## 2019-12-13 DIAGNOSIS — K317 Polyp of stomach and duodenum: Secondary | ICD-10-CM | POA: Insufficient documentation

## 2019-12-13 DIAGNOSIS — Z905 Acquired absence of kidney: Secondary | ICD-10-CM | POA: Diagnosis not present

## 2019-12-13 DIAGNOSIS — N183 Chronic kidney disease, stage 3 unspecified: Secondary | ICD-10-CM | POA: Diagnosis not present

## 2019-12-13 DIAGNOSIS — Z9049 Acquired absence of other specified parts of digestive tract: Secondary | ICD-10-CM | POA: Diagnosis not present

## 2019-12-13 DIAGNOSIS — Z794 Long term (current) use of insulin: Secondary | ICD-10-CM | POA: Insufficient documentation

## 2019-12-13 DIAGNOSIS — Z79899 Other long term (current) drug therapy: Secondary | ICD-10-CM | POA: Insufficient documentation

## 2019-12-13 DIAGNOSIS — N189 Chronic kidney disease, unspecified: Secondary | ICD-10-CM | POA: Diagnosis not present

## 2019-12-13 DIAGNOSIS — E1142 Type 2 diabetes mellitus with diabetic polyneuropathy: Secondary | ICD-10-CM | POA: Insufficient documentation

## 2019-12-13 DIAGNOSIS — Z8673 Personal history of transient ischemic attack (TIA), and cerebral infarction without residual deficits: Secondary | ICD-10-CM | POA: Diagnosis not present

## 2019-12-13 DIAGNOSIS — K259 Gastric ulcer, unspecified as acute or chronic, without hemorrhage or perforation: Secondary | ICD-10-CM | POA: Diagnosis not present

## 2019-12-13 HISTORY — PX: ESOPHAGOGASTRODUODENOSCOPY (EGD) WITH PROPOFOL: SHX5813

## 2019-12-13 HISTORY — PX: POLYPECTOMY: SHX5525

## 2019-12-13 LAB — GLUCOSE, CAPILLARY
Glucose-Capillary: 75 mg/dL (ref 70–99)
Glucose-Capillary: 80 mg/dL (ref 70–99)

## 2019-12-13 SURGERY — ESOPHAGOGASTRODUODENOSCOPY (EGD) WITH PROPOFOL
Anesthesia: General

## 2019-12-13 MED ORDER — CHLORHEXIDINE GLUCONATE CLOTH 2 % EX PADS: 6.0000 | MEDICATED_PAD | Freq: Once | CUTANEOUS | Status: DC

## 2019-12-13 MED ORDER — PROPOFOL 10 MG/ML IV BOLUS
INTRAVENOUS | Status: DC | PRN
  Administered 2019-12-13: 20 mg via INTRAVENOUS
  Administered 2019-12-13: 100 mg via INTRAVENOUS
  Administered 2019-12-13: 150 ug/kg/min via INTRAVENOUS
  Administered 2019-12-13 (×2): 20 mg via INTRAVENOUS

## 2019-12-13 MED ORDER — LIDOCAINE HCL (CARDIAC) PF 50 MG/5ML IV SOSY
PREFILLED_SYRINGE | INTRAVENOUS | Status: DC | PRN
Start: 2019-12-13 — End: 2019-12-13
  Administered 2019-12-13: 100 mg via INTRAVENOUS

## 2019-12-13 MED ORDER — DEXTROSE 50 % IV SOLN
12.5000 g | Freq: Once | INTRAVENOUS | Status: AC
  Administered 2019-12-13: 12.5 g via INTRAVENOUS

## 2019-12-13 MED ORDER — LIDOCAINE VISCOUS HCL 2 % MT SOLN
OROMUCOSAL | Status: AC
  Filled 2019-12-13: qty 15

## 2019-12-13 MED ORDER — LACTATED RINGERS IV SOLN: Freq: Once | INTRAVENOUS | Status: AC

## 2019-12-13 MED ORDER — PROPOFOL 10 MG/ML IV BOLUS
INTRAVENOUS | Status: AC
  Filled 2019-12-13: qty 40

## 2019-12-13 MED ORDER — LIDOCAINE 2% (20 MG/ML) 5 ML SYRINGE
INTRAMUSCULAR | Status: AC
  Filled 2019-12-13: qty 10

## 2019-12-13 MED ORDER — LIDOCAINE VISCOUS HCL 2 % MT SOLN
15.0000 mL | Freq: Once | OROMUCOSAL | Status: AC
  Administered 2019-12-13: 15 mL via OROMUCOSAL

## 2019-12-13 MED ORDER — LACTATED RINGERS IV SOLN: INTRAVENOUS | Status: DC | PRN

## 2019-12-13 MED ORDER — PROPOFOL 10 MG/ML IV BOLUS
INTRAVENOUS | Status: AC
  Filled 2019-12-13: qty 100

## 2019-12-13 MED ORDER — STERILE WATER FOR IRRIGATION IR SOLN
Status: DC | PRN
  Administered 2019-12-13: 1.5 mL

## 2019-12-13 MED ORDER — GLYCOPYRROLATE 0.2 MG/ML IJ SOLN
0.2000 mg | Freq: Once | INTRAMUSCULAR | Status: AC
  Administered 2019-12-13: 0.2 mg via INTRAVENOUS

## 2019-12-13 MED ORDER — GLYCOPYRROLATE 0.2 MG/ML IJ SOLN
INTRAMUSCULAR | Status: AC
  Filled 2019-12-13: qty 1

## 2019-12-13 MED ORDER — DEXTROSE 50 % IV SOLN
INTRAVENOUS | Status: AC
  Filled 2019-12-13: qty 50

## 2019-12-13 NOTE — Anesthesia Preprocedure Evaluation (Signed)
Anesthesia Evaluation  Patient identified by MRN, date of birth, ID band Patient awake    Reviewed: Allergy & Precautions, NPO status , Patient's Chart, lab work & pertinent test results, reviewed documented beta blocker date and time   History of Anesthesia Complications (+) PONV and history of anesthetic complications  Airway Mallampati: II  TM Distance: >3 FB Neck ROM: Full    Dental  (+) Missing Veneers :   Pulmonary shortness of breath and with exertion,    Pulmonary exam normal breath sounds clear to auscultation       Cardiovascular Exercise Tolerance: Good hypertension, Pt. on medications and Pt. on home beta blockers Normal cardiovascular exam+ dysrhythmias  Rhythm:Regular Rate:Normal - Systolic murmurs, - Diastolic murmurs, - Friction Rub, - Carotid Bruit, - Peripheral Edema and - Systolic Click EKG- LBBB   Neuro/Psych PSYCHIATRIC DISORDERS Depression TIA Neuromuscular disease CVA, No Residual Symptoms    GI/Hepatic GERD  Medicated and Controlled,  Endo/Other  diabetes, Well Controlled, Type 2, Oral Hypoglycemic Agents  Renal/GU Renal InsufficiencyRenal disease     Musculoskeletal negative musculoskeletal ROS (+)   Abdominal   Peds  Hematology  (+) anemia ,   Anesthesia Other Findings   Reproductive/Obstetrics negative OB ROS                            Anesthesia Physical Anesthesia Plan  ASA: III  Anesthesia Plan: General   Post-op Pain Management:    Induction: Intravenous  PONV Risk Score and Plan:   Airway Management Planned: Nasal Cannula, Natural Airway and Simple Face Mask  Additional Equipment:   Intra-op Plan:   Post-operative Plan:   Informed Consent: I have reviewed the patients History and Physical, chart, labs and discussed the procedure including the risks, benefits and alternatives for the proposed anesthesia with the patient or authorized  representative who has indicated his/her understanding and acceptance.     Dental advisory given  Plan Discussed with: CRNA and Surgeon  Anesthesia Plan Comments:         Anesthesia Quick Evaluation

## 2019-12-13 NOTE — Anesthesia Postprocedure Evaluation (Signed)
Anesthesia Post Note  Patient: Angela Harris  Procedure(s) Performed: ESOPHAGOGASTRODUODENOSCOPY (EGD) WITH PROPOFOL (N/A ) POLYPECTOMY (N/A )  Patient location during evaluation: PACU Anesthesia Type: General Level of consciousness: awake, oriented, awake and alert and patient cooperative Pain management: pain level controlled Respiratory status: respiratory function stable, spontaneous breathing, nonlabored ventilation and patient connected to nasal cannula oxygen Cardiovascular status: blood pressure returned to baseline and stable Postop Assessment: no headache and no backache Anesthetic complications: no   No complications documented.   Last Vitals: There were no vitals filed for this visit.  Last Pain:  Vitals:   12/13/19 1243  PainSc: 0-No pain                 Tacy Learn

## 2019-12-13 NOTE — H&P (Signed)
Angela Harris is an 74 y.o. female.   Chief Complaint: Patient is 74 year old Caucasian female who is here for esophagogastroduodenoscopy and gastric polypectomy. HPI: Patient is 74 year old Caucasian female who has chronic GERD and underwent esophagogastroduodenoscopy with esophageal dilation on November 02, 2019.  She was found to have large ulcerated polyp at the level of hiatus long with 2 small polyps at the fundus..  Biopsy revealed hyperplastic polyps.  I recommended endoscopic removal given risk of bleeding.  She says her swallowing has improved but is not back to normal.  Heartburn is well controlled with PPI.  Last aspirin dose was 2 weeks ago.  Past Medical History:  Diagnosis Date  . Anemia   . CKD (chronic kidney disease) stage 3, GFR 30-59 ml/min   . Depression   . Dysrhythmia    LBBB  . Essential hypertension   . GERD (gastroesophageal reflux disease)   . History of TIA (transient ischemic attack) 2015  . Hyperlipidemia   . Meniere disease    pt denies  . Peripheral neuropathy   . PONV (postoperative nausea and vomiting)   . Stroke [facility]) [address]15   "mini-stroke". No deficits from this  . Type 2 diabetes mellitus ([place])     Past Surgical History:  Procedure Laterality Date  . ABDOMINAL HYSTERECTOMY    . BACK SURGERY     ruptured disc lower back  . BIOPSY  11/02/2019   Procedure: BIOPSY;  Surgeon: Rogene Houston, MD;  [facility];  Service: Endoscopy;;  gastric  . CARPAL TUNNEL RELEASE Right 08/20/2016   Procedure: CARPAL TUNNEL RELEASE;  Surgeon: Carole Civil, MD;  Location: [facility];  Service: Orthopedics;  Laterality: Right;  . CATARACT EXTRACTION, BILATERAL    . CESAREAN SECTION    . CHOLECYSTECTOMY    . Closed reduction right wrist fracture Right   . ECTOPIC PREGNANCY SURGERY    . ESOPHAGEAL DILATION N/A 11/02/2019   Procedure: ESOPHAGEAL DILATION;  Surgeon: Rogene Houston, MD;  [facility];  Service: Endoscopy;  Laterality: N/A;  .  ESOPHAGOGASTRODUODENOSCOPY N/A 11/02/2019   Procedure: ESOPHAGOGASTRODUODENOSCOPY (EGD);  Surgeon: Rogene Houston, MD;  Location: [facility];  Service: Endoscopy;  Laterality: N/A;  125  . ORIF WRIST FRACTURE Right 02/26/2016   Procedure: OPEN REDUCTION INTERNAL FIXATION (ORIF) WRIST FRACTURE;  Surgeon: Carole Civil, MD;  Location: [facility];  Service: Orthopedics;  Laterality: Right;  TIME 12:30  . ROBOTIC ASSITED PARTIAL NEPHRECTOMY Left 06/18/2017   Procedure: XI ROBOTIC ASSITED PARTIAL NEPHRECTOMY;  Surgeon: Alexis Frock, MD;  Location: [facility];  Service: Urology;  Laterality: Left;  . TONSILLECTOMY      Family History  Problem Relation Age of Onset  . Alzheimer's disease Mother   . Thyroid disease Mother   . Hypertension Mother   . Alcoholism Father   . Hypertension Father   . Alcoholism Sister   . Hypertension Sister   . Alcoholism Brother   . Hypertension Brother    Social History:  reports that she has never smoked. She has never used smokeless tobacco. She reports that she does not drink alcohol and does not use drugs.  Allergies: No Known Allergies  Medications Prior to Admission  Medication Sig Dispense Refill  . acetaminophen (TYLENOL) 500 MG tablet Take 1,000 mg by mouth every 6 (six) hours as needed for mild pain.    Marland Kitchen amLODipine (NORVASC) 10 MG tablet Take 10 mg by mouth daily.    Marland Kitchen aspirin  EC 81 MG tablet Take 81 mg by mouth at bedtime.  30 tablet 11  . atenolol (TENORMIN) 50 MG tablet Take 50 mg by mouth daily.    . Calcium Carb-Cholecalciferol [phone] MG-UNIT CAPS Take 1 tablet by mouth daily.     Marland Kitchen gabapentin (NEURONTIN) 300 MG capsule Take 300 mg by mouth 2 (two) times daily.    . insulin degludec (TRESIBA FLEXTOUCH) 100 UNIT/ML FlexTouch Pen Inject 96 Units into the skin daily.     Marland Kitchen lisinopril (PRINIVIL,ZESTRIL) 40 MG tablet Take 40 mg by mouth daily.    . Multiple Vitamins-Minerals (MULTIVITAMIN ADULTS PO) Take 1 tablet by mouth daily.     .  ondansetron (ZOFRAN) 4 MG tablet TAKE 1 TABLET BY MOUTH TWICE DAILY AS NEEDED FOR NAUSEA AND VOMITING (Patient taking differently: Take 4 mg by mouth 2 (two) times daily as needed for nausea or vomiting. ) 30 tablet 0  . pantoprazole (PROTONIX) 40 MG tablet Take 40 mg by mouth 2 (two) times daily.     . simvastatin (ZOCOR) 40 MG tablet Take 40 mg by mouth at bedtime.    Marland Kitchen venlafaxine XR (EFFEXOR-XR) 75 MG 24 hr capsule Take 75 mg by mouth daily with breakfast.    . ONE TOUCH ULTRA TEST test strip USE ONE STRIP TO CHECK GLUCOSE 4 TIMES DAILY 150 each 5  . rosuvastatin (CRESTOR) 5 MG tablet Take 5 mg by mouth at bedtime. (Patient not taking: Reported on 11/28/2019)      Results for orders placed or performed during the hospital encounter of 12/13/19 (from the past 48 hour(s))  Glucose, capillary     Status: None   Collection Time: 12/13/19 11:26 AM  Result Value Ref Range   Glucose-Capillary 75 70 - 99 mg/dL    Comment: Glucose reference range applies only to samples taken after fasting for at least 8 hours.   No results found.  Review of Systems  There were no vitals taken for this visit. Physical Exam HENT:     Mouth/Throat:     Mouth: Mucous membranes are moist.     Pharynx: Oropharynx is clear.  Eyes:     General: No scleral icterus.    Conjunctiva/sclera: Conjunctivae normal.  Cardiovascular:     Rate and Rhythm: Normal rate and regular rhythm.     Heart sounds: Normal heart sounds. No murmur heard.   Pulmonary:     Effort: Pulmonary effort is normal.     Breath sounds: Normal breath sounds.  Abdominal:     General: There is no distension.     Palpations: Abdomen is soft. There is no mass.     Tenderness: There is no abdominal tenderness.  Musculoskeletal:        General: No swelling.  Lymphadenopathy:     Cervical: No cervical adenopathy.  Skin:    General: Skin is warm and dry.  Neurological:     Mental Status: She is alert.      Assessment/Plan Gastric  polyps. Esophagogastroduodenoscopy with gastric polypectomy.  Hildred Laser, MD 12/13/2019, 12:40 PM

## 2019-12-13 NOTE — Transfer of Care (Signed)
Immediate Anesthesia Transfer of Care Note  Patient: Angela Harris  Procedure(s) Performed: ESOPHAGOGASTRODUODENOSCOPY (EGD) WITH PROPOFOL (N/A ) POLYPECTOMY (N/A )  Patient Location: PACU  Anesthesia Type:General  Level of Consciousness: awake, alert , oriented and patient cooperative  Airway & Oxygen Therapy: Patient Spontanous Breathing and Patient connected to nasal cannula oxygen  Post-op Assessment: Report given to RN, Post -op Vital signs reviewed and stable and Patient moving all extremities  Post vital signs: Reviewed and stable  Last Vitals:  Vitals Value Taken Time  BP 86/57 12/13/19 1317  Temp    Pulse 59 12/13/19 1318  Resp 17 12/13/19 1318  SpO2 94 % 12/13/19 1318  Vitals shown include unvalidated device data.  Last Pain:  Vitals:   12/13/19 1243  PainSc: 0-No pain         Complications: No complications documented.

## 2019-12-13 NOTE — Discharge Instructions (Signed)
No aspirin or NSAIDs for 1 week. Resume other medications as before. Resume usual diet. No driving for 24 hours. Remember you cannot have an MRI until clips have passed. Physician will call with biopsy results.  Upper Endoscopy, Adult, Care After This sheet gives you information about how to care for yourself after your procedure. Your health care provider may also give you more specific instructions. If you have problems or questions, contact your health care provider. What can I expect after the procedure? After the procedure, it is common to have:  A sore throat.  Mild stomach pain or discomfort.  Bloating.  Nausea. Follow these instructions at home:   Follow instructions from your health care provider about what to eat or drink after your procedure.  Return to your normal activities as told by your health care provider. Ask your health care provider what activities are safe for you.  Take over-the-counter and prescription medicines only as told by your health care provider.  Do not drive for 24 hours if you were given a sedative during your procedure.  Keep all follow-up visits as told by your health care provider. This is important. Contact a health care provider if you have:  A sore throat that lasts longer than one day.  Trouble swallowing. Get help right away if:  You vomit blood or your vomit looks like coffee grounds.  You have: ? A fever. ? Bloody, black, or tarry stools. ? A severe sore throat or you cannot swallow. ? Difficulty breathing. ? Severe pain in your chest or abdomen. Summary  After the procedure, it is common to have a sore throat, mild stomach discomfort, bloating, and nausea.  Do not drive for 24 hours if you were given a sedative during the procedure.  Follow instructions from your health care provider about what to eat or drink after your procedure.  Return to your normal activities as told by your health care provider. This  information is not intended to replace advice given to you by your health care provider. Make sure you discuss any questions you have with your health care provider. Document Revised: 10/19/2017 Document Reviewed: 09/27/2017 Elsevier Patient Education  [place].

## 2019-12-13 NOTE — Op Note (Signed)
[facility] Patient Name: Angela Harris Procedure Date: 12/13/2019 12:22 PM MRN: [id number] Date of Birth: 12/28/1945 Attending MD: Hildred Laser , MD CSN: [id number] Age: 74 Admit Type: Outpatient Procedure:                Upper GI endoscopy Indications:              Therapeutic procedure Providers:                Hildred Laser, MD, Nelda Severe, RN, Aram Candela Referring MD:             Mitzie Na. Quillian Quince, MD Medicines:                Propofol per Anesthesia Complications:            No immediate complications. Estimated Blood Loss:     Estimated blood loss was minimal. Procedure:                Pre-Anesthesia Assessment:                           - Prior to the procedure, a History and Physical                            was performed, and patient medications and                            allergies were reviewed. The patient's tolerance of                            previous anesthesia was also reviewed. The risks                            and benefits of the procedure and the sedation                            options and risks were discussed with the patient.                            All questions were answered, and informed consent                            was obtained. Prior Anticoagulants: The patient has                            taken no previous anticoagulant or antiplatelet                            agents except for aspirin. ASA Grade Assessment: II                            - A patient with mild systemic disease. After                            reviewing the risks and benefits, the patient was  deemed in satisfactory condition to undergo the                            procedure.                           After obtaining informed consent, the endoscope was                            passed under direct vision. Throughout the                            procedure, the patient's blood pressure, pulse, and                             oxygen saturations were monitored continuously. The                            GIF-H190 ([id number]) scope was introduced through the                            mouth, and advanced to the second part of duodenum.                            The upper GI endoscopy was accomplished without                            difficulty. The patient tolerated the procedure                            well. Scope In: Scope Out: 1:13:09 PM Scope Withdrawal Time: 0 hours 5 minutes 17 seconds  Findings:      The hypopharynx was normal.      The examined esophagus was normal.      The Z-line was regular.      A single 6 mm sessile polyp with no bleeding and no stigmata of recent       bleeding was found on the lesser curvature of the gastric body. The       polyp was removed with a hot snare. Resection and retrieval were       complete. The pathology specimen was placed into Bottle Number 1.      A single 10 to 20 mm pedunculated and sessile polyp with no bleeding and       no stigmata of recent bleeding was found on the posterior wall of the       gastric body. The polyp was removed with a hot snare. Resection and       retrieval were complete. To stop active bleeding, two hemostatic clips       were successfully placed (MR conditional). There was no bleeding at the       end of the procedure.      A single 12 mm semi-sessile polyp with no bleeding and no stigmata of       recent bleeding was found in the gastric fundus. One hemostatic clip was       successfully placed at base.(MR conditional). There was no bleeding  during, or at the end, of the procedure.      A single 10 mm semi-sessile polyp with no stigmata of recent bleeding       was found in the cardia. This polyp was not removed. difficult approach.      The duodenal bulb and second portion of the duodenum were normal. Impression:               - Normal hypopharynx.                           - Normal esophagus.                           -  Z-line regular.                           - A single gastric polyp at distal body(small) .                            Resected and retrieved.                           - A single gastric polyp at proximal body(large).                            Resected and retrieved. Clips (MR conditional) were                            placed.                           - A single gastric polyp. Clip (MR conditional) was                            placed at base.                           - A single gastric polyp could not be removed                            bacause of location.                           - Normal duodenal bulb and second portion of the                            duodenum.                           comment: all polyps had ulcerated surface. Moderate Sedation:      Per Anesthesia Care Recommendation:           - Patient has a contact number available for                            emergencies. The signs and symptoms of potential  delayed complications were discussed with the                            patient. Return to normal activities tomorrow.                            Written discharge instructions were provided to the                            patient.                           - Resume previous diet today.                           - Continue present medications.                           - No aspirin, ibuprofen, naproxen, or other                            non-steroidal anti-inflammatory drugs for 7 days.                           - Await pathology results. Procedure Code(s):        --- Professional ---                           [phone], Esophagogastroduodenoscopy, flexible,                            transoral; with removal of tumor(s), polyp(s), or                            other lesion(s) by snare technique Diagnosis Code(s):        --- Professional ---                           K31.7, Polyp of stomach and duodenum CPT copyright 2019 American  Medical Association. All rights reserved. The codes documented in this report are preliminary and upon coder review may  be revised to meet current compliance requirements. Hildred Laser, MD Hildred Laser, MD 12/13/2019 1:27:14 PM This report has been signed electronically. Number of Addenda: 0

## 2019-12-14 LAB — SURGICAL PATHOLOGY

## 2019-12-18 ENCOUNTER — Encounter (HOSPITAL_COMMUNITY): Payer: Self-pay | Admitting: Internal Medicine

## 2020-01-09 ENCOUNTER — Ambulatory Visit: Payer: Medicare Other | Admitting: Nutrition

## 2020-01-09 DIAGNOSIS — K219 Gastro-esophageal reflux disease without esophagitis: Secondary | ICD-10-CM | POA: Diagnosis not present

## 2020-01-09 DIAGNOSIS — E1122 Type 2 diabetes mellitus with diabetic chronic kidney disease: Secondary | ICD-10-CM | POA: Diagnosis not present

## 2020-01-09 DIAGNOSIS — N184 Chronic kidney disease, stage 4 (severe): Secondary | ICD-10-CM | POA: Diagnosis not present

## 2020-01-09 DIAGNOSIS — I129 Hypertensive chronic kidney disease with stage 1 through stage 4 chronic kidney disease, or unspecified chronic kidney disease: Secondary | ICD-10-CM | POA: Diagnosis not present

## 2020-01-10 ENCOUNTER — Encounter: Payer: Medicare Other | Attending: Family Medicine | Admitting: Nutrition

## 2020-01-10 ENCOUNTER — Other Ambulatory Visit: Payer: Self-pay

## 2020-01-10 VITALS — Ht 64.5 in | Wt 183.0 lb

## 2020-01-10 DIAGNOSIS — N1832 Chronic kidney disease, stage 3b: Secondary | ICD-10-CM

## 2020-01-10 DIAGNOSIS — E1159 Type 2 diabetes mellitus with other circulatory complications: Secondary | ICD-10-CM | POA: Diagnosis not present

## 2020-01-10 DIAGNOSIS — E782 Mixed hyperlipidemia: Secondary | ICD-10-CM | POA: Diagnosis not present

## 2020-01-10 NOTE — Patient Instructions (Signed)
Goals  Follow My Plate Eat meals on time as discussed. Drink only water Take insulin a 7 pm nightly Test blood sugars twice a day Goal to get A1C down to 7 pm

## 2020-01-10 NOTE — Progress Notes (Signed)
  Medical Nutrition Therapy:  Appt start time: 1430 end time:  1007.   Assessment:  Primary concerns today: Diabetes Type 2 and CKD Stg 4. Creatine 1.41 and eGFR < 37. Her son lives with her. She eats out a lot.  Dx 10 yrs ago. PCP Dr. Quillian Quince  Currently taking  Ozempic for her DM (?); . 70/30 insulin 35 -40 units BID.  Changes made: Ozempic has really helped.  Donesn't have to go to bhe bathroom. Use to dance a lot before covid. Gained 20 lbs in the last 1-2 years.    Dr. Laural Golden EGD.  Dr. In [facility].  Had some polps in stomach and colon.  FBS: 70-100 mg/dl Bedtime 100-120's. BS better since ozempic. Diet is high in concentrated sweets from beverages. Meal are inconsistent and misses meals.   Preferred Learning Style  No preference indicated   Learning Readiness:   Ready  Change in progress   MEDICATIONS:    DIETARY INTAKE:     24-hr recall:  B ( AM): Skipped Had stomach issues,    Water and  5 pm: Chicken and rice soup, crackers, 1/2 sweet and unswee tea.  Usual physical activity: walks a little  Estimated energy needs: 1200  calories 135 g carbohydrates  90g protein 33 g fat  Progress Towards Goal(s):  In progress.   Nutritional Diagnosis:  NB-1.1 Food and nutrition-related knowledge deficit As related to Diabetes Type 2.  As evidenced by A1C .    Intervention:  Nutrition and Diabetes education provided on My Plate, CHO counting, meal planning, portion sizes, timing of meals, avoiding snacks between meals unless having a low blood sugar, target ranges for A1C and blood sugars, signs/symptoms and treatment of hyper/hypoglycemia, monitoring blood sugars, taking medications as prescribed, benefits of exercising 30 minutes per day and prevention of complications of DM.Discused Stg 4 CKD and need for low salt and monitor potassium and phosphorus intake. Follow a low salt diet.   Goals  Follow My Plate Eat meals on time as discussed. Drink only  water Take insulin a before breakfast and before supper. Test blood sugars twice a day Goal to get A1C down to 7 pm   Teaching Method Utilized:  Visual Auditory Hands on  Handouts given during visit include:  The Plate Method   Meal Plan Card  Diabetes Instructions.   Barriers to learning/adherence to lifestyle change: none  Demonstrated degree of understanding via:  Teach Back   Monitoring/Evaluation:  Dietary intake, exercise, , and body weight in 3 month(s).

## 2020-01-22 DIAGNOSIS — I10 Essential (primary) hypertension: Secondary | ICD-10-CM | POA: Diagnosis not present

## 2020-01-22 DIAGNOSIS — E1122 Type 2 diabetes mellitus with diabetic chronic kidney disease: Secondary | ICD-10-CM | POA: Diagnosis not present

## 2020-01-22 DIAGNOSIS — E1142 Type 2 diabetes mellitus with diabetic polyneuropathy: Secondary | ICD-10-CM | POA: Diagnosis not present

## 2020-01-22 DIAGNOSIS — E871 Hypo-osmolality and hyponatremia: Secondary | ICD-10-CM | POA: Diagnosis not present

## 2020-01-22 DIAGNOSIS — E782 Mixed hyperlipidemia: Secondary | ICD-10-CM | POA: Diagnosis not present

## 2020-01-22 DIAGNOSIS — B9681 Helicobacter pylori [H. pylori] as the cause of diseases classified elsewhere: Secondary | ICD-10-CM | POA: Diagnosis not present

## 2020-01-22 DIAGNOSIS — E1165 Type 2 diabetes mellitus with hyperglycemia: Secondary | ICD-10-CM | POA: Diagnosis not present

## 2020-01-22 DIAGNOSIS — K219 Gastro-esophageal reflux disease without esophagitis: Secondary | ICD-10-CM | POA: Diagnosis not present

## 2020-01-22 DIAGNOSIS — N184 Chronic kidney disease, stage 4 (severe): Secondary | ICD-10-CM | POA: Diagnosis not present

## 2020-01-22 DIAGNOSIS — R5383 Other fatigue: Secondary | ICD-10-CM | POA: Diagnosis not present

## 2020-01-25 DIAGNOSIS — Z0001 Encounter for general adult medical examination with abnormal findings: Secondary | ICD-10-CM | POA: Diagnosis not present

## 2020-01-25 DIAGNOSIS — K219 Gastro-esophageal reflux disease without esophagitis: Secondary | ICD-10-CM | POA: Diagnosis not present

## 2020-01-25 DIAGNOSIS — D500 Iron deficiency anemia secondary to blood loss (chronic): Secondary | ICD-10-CM | POA: Diagnosis not present

## 2020-01-25 DIAGNOSIS — F331 Major depressive disorder, recurrent, moderate: Secondary | ICD-10-CM | POA: Diagnosis not present

## 2020-01-25 DIAGNOSIS — Z23 Encounter for immunization: Secondary | ICD-10-CM | POA: Diagnosis not present

## 2020-01-25 DIAGNOSIS — E1122 Type 2 diabetes mellitus with diabetic chronic kidney disease: Secondary | ICD-10-CM | POA: Diagnosis not present

## 2020-01-25 DIAGNOSIS — E782 Mixed hyperlipidemia: Secondary | ICD-10-CM | POA: Diagnosis not present

## 2020-01-25 DIAGNOSIS — I10 Essential (primary) hypertension: Secondary | ICD-10-CM | POA: Diagnosis not present

## 2020-01-29 DIAGNOSIS — Z1231 Encounter for screening mammogram for malignant neoplasm of breast: Secondary | ICD-10-CM | POA: Diagnosis not present

## 2020-02-01 ENCOUNTER — Encounter: Payer: Self-pay | Admitting: Nutrition

## 2020-02-07 DIAGNOSIS — R1084 Generalized abdominal pain: Secondary | ICD-10-CM | POA: Diagnosis not present

## 2020-02-08 DIAGNOSIS — E1122 Type 2 diabetes mellitus with diabetic chronic kidney disease: Secondary | ICD-10-CM | POA: Diagnosis not present

## 2020-02-08 DIAGNOSIS — I129 Hypertensive chronic kidney disease with stage 1 through stage 4 chronic kidney disease, or unspecified chronic kidney disease: Secondary | ICD-10-CM | POA: Diagnosis not present

## 2020-02-08 DIAGNOSIS — K219 Gastro-esophageal reflux disease without esophagitis: Secondary | ICD-10-CM | POA: Diagnosis not present

## 2020-02-08 DIAGNOSIS — N184 Chronic kidney disease, stage 4 (severe): Secondary | ICD-10-CM | POA: Diagnosis not present

## 2020-02-13 ENCOUNTER — Encounter: Payer: Medicare Other | Attending: Family Medicine | Admitting: Nutrition

## 2020-02-13 ENCOUNTER — Other Ambulatory Visit: Payer: Self-pay

## 2020-02-13 ENCOUNTER — Encounter: Payer: Self-pay | Admitting: Nutrition

## 2020-02-13 VITALS — Ht 64.0 in | Wt 179.0 lb

## 2020-02-13 DIAGNOSIS — N1832 Chronic kidney disease, stage 3b: Secondary | ICD-10-CM | POA: Diagnosis not present

## 2020-02-13 DIAGNOSIS — E782 Mixed hyperlipidemia: Secondary | ICD-10-CM | POA: Diagnosis not present

## 2020-02-13 DIAGNOSIS — E1159 Type 2 diabetes mellitus with other circulatory complications: Secondary | ICD-10-CM | POA: Diagnosis not present

## 2020-02-13 NOTE — Progress Notes (Signed)
° °  Medical Nutrition Therapy:  Appt start time: 1430 end time: 1500  Assessment:  Primary concerns today: Diabetes Type 2 and CKD Stg 4. Creatine 1.41 and eGFR < 37. She notes she went to see PCP and found out she does have HPylori. A stool sample was taken also. Waiting on finding out about antibiotics she is suppose to take. Her son lives with her. She is making better choices when eating out. Cooking more at home.  Dx 10 yrs ago. PCP Dr. Quillian Quince  Currently taking  Ozempic for her DM. A1C down to 7.3% she says.; Tyler Aas 86 units daily. FBS -100's  Testing once a day now.  Changes made: Really working on eating breakfast now and eating meals on time. Taking meals to work. Has more energy throughout the day Drinking unsweet green tea and water Ozempic has really helped.  Doesn't have to go to the bathroom too often now.. Use to dance a lot before covid. Starting to dance for exercise in her house. Lost 4 lbs in the last month.    Dr. Laural Golden EGD.  Dr. In [facility] Had some polps in stomach and colon. FBS: 70-100 mg/dl Bedtime 100-120's. BS better since ozempic.  Preferred Learning Style  No preference indicated   Learning Readiness:   Ready  Change in progress   MEDICATIONS:    DIETARY INTAKE:     24-hr recall:  B ( AM): yogurt and cereal bar or egg sandwich, water L) 1/2 chicken salad sandwich, lettuce and tomato, chips,  Water Dinner: Fried okra , pinto beans, macaroni salad, water,   Usual physical activity: walks a little  Estimated energy needs: 1200  calories 135 g carbohydrates  90g protein 33 g fat  Progress Towards Goal(s):  In progress.   Nutritional Diagnosis:  NB-1.1 Food and nutrition-related knowledge deficit As related to Diabetes Type 2.  As evidenced by A1C .    Intervention:  Nutrition and Diabetes education provided on My Plate, CHO counting, meal planning, portion sizes, timing of meals, avoiding snacks between meals unless having a  low blood sugar, target ranges for A1C and blood sugars, signs/symptoms and treatment of hyper/hypoglycemia, monitoring blood sugars, taking medications as prescribed, benefits of exercising 30 minutes per day and prevention of complications of DM.Discused Stg 4 CKD and need for low salt and monitor potassium and phosphorus intake. Follow a low salt diet.  Goals Increase lower carb vegetables with lunch and dinner Increase fruit with meals Keep drinking most water Dance 30-60 minutes most days of the week for exercise. Lose 1 lb per week Get A1C to 6.5%. Notify MD if BS are less than 70 to further reduce [place].  Teaching Method Utilized:  Visual Auditory Hands on  Handouts given during visit include:  The Plate Method   Meal Plan Card  Diabetes Instructions.   Barriers to learning/adherence to lifestyle change: none  Demonstrated degree of understanding via:  Teach Back   Monitoring/Evaluation:  Dietary intake, exercise, , and body weight in 3 month(s).

## 2020-02-13 NOTE — Patient Instructions (Signed)
Goals Increase lower carb vegetables with lunch and dinner Increase fruit with meals Keep drinking most water Dance 30-60 minutes most days of the week for exercise. Lose 1 lb per week Get A1C to 6.5%. Notify MD if BS are less than 70 to further reduce [place].

## 2020-03-04 IMAGING — DX DG CHEST 2V
2 series · 2 of 2 positions shown · non-contrast
Comparison: None.

CLINICAL DATA: History of renal cell carcinoma, follow-up

EXAM:
CHEST - 2 VIEW

[chest pa]
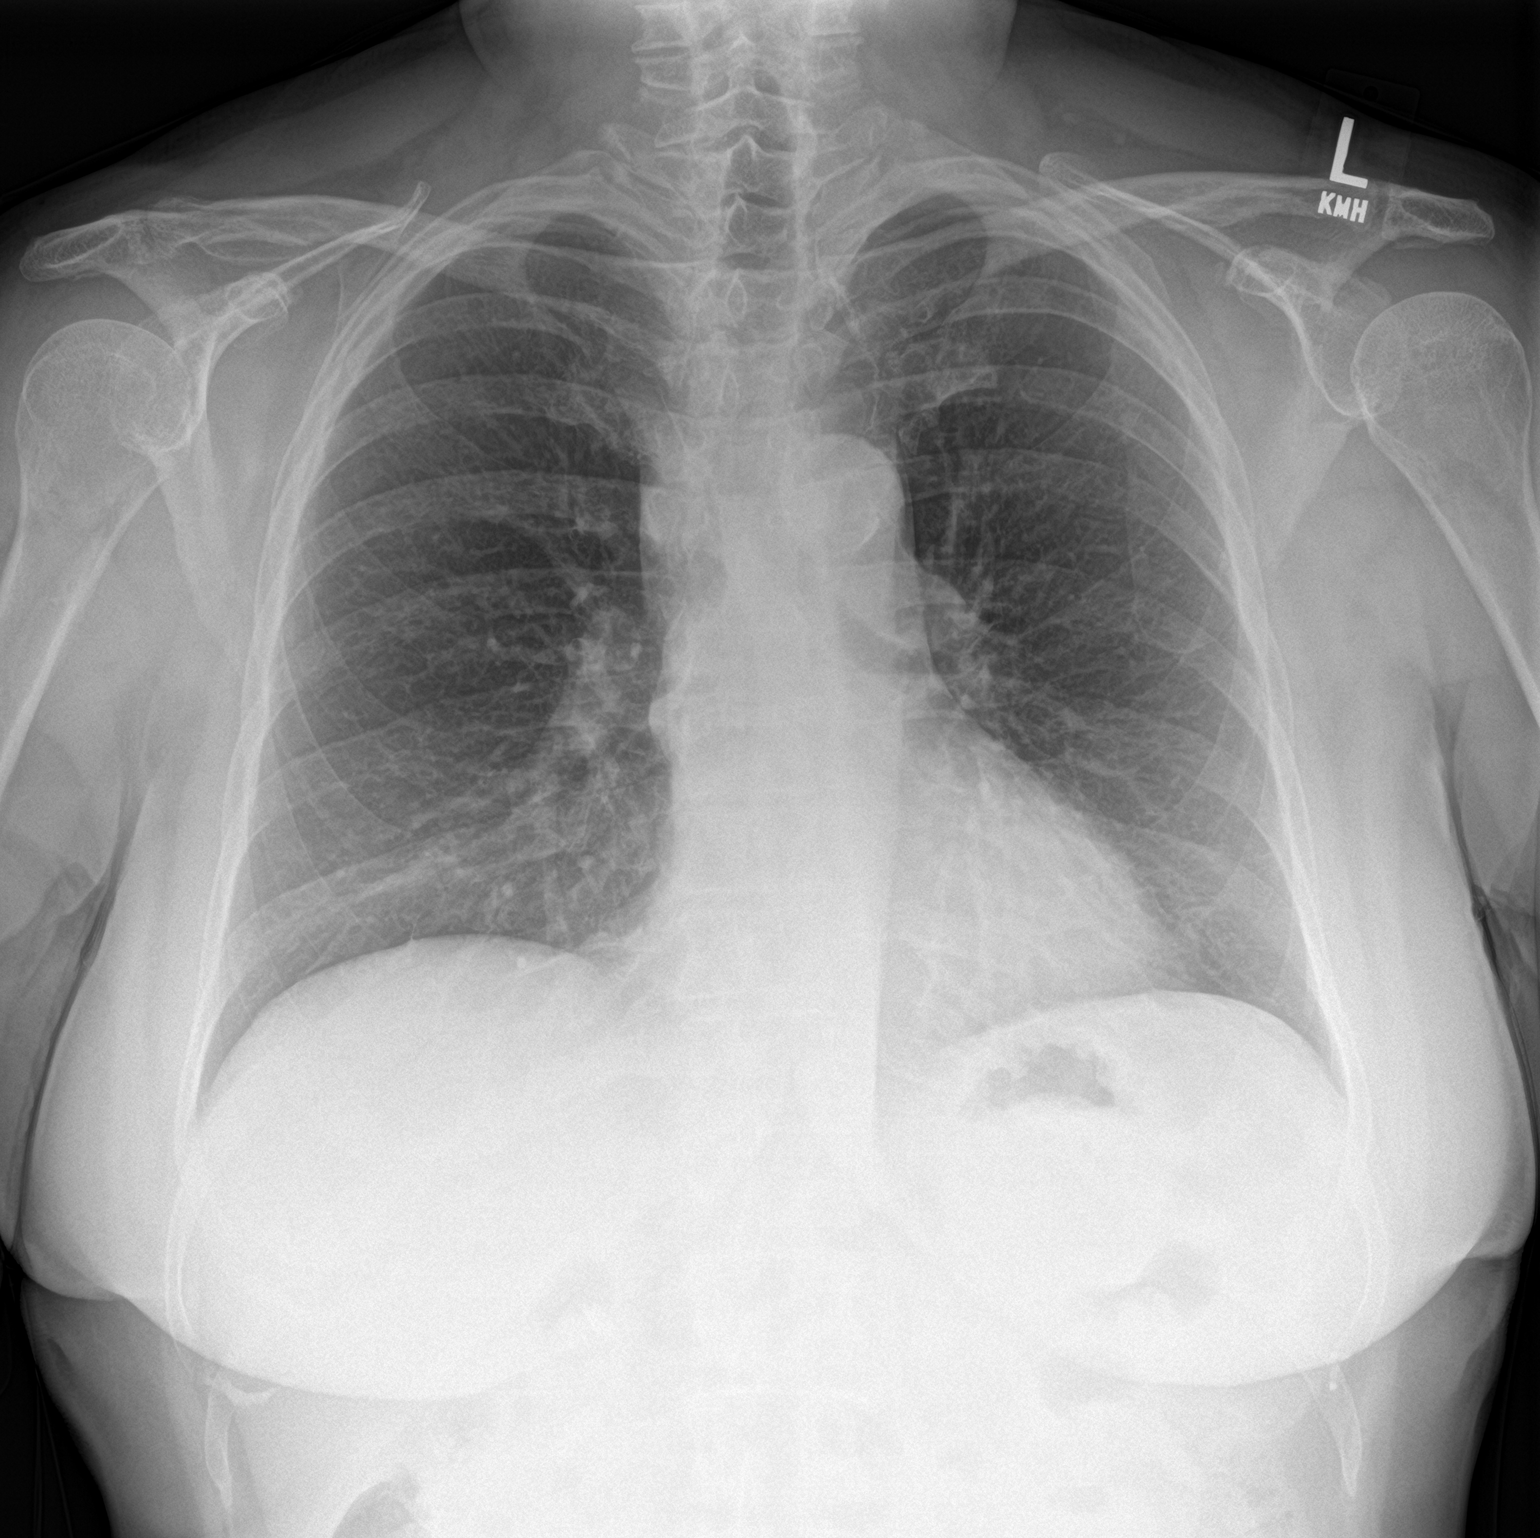

[chest lat]
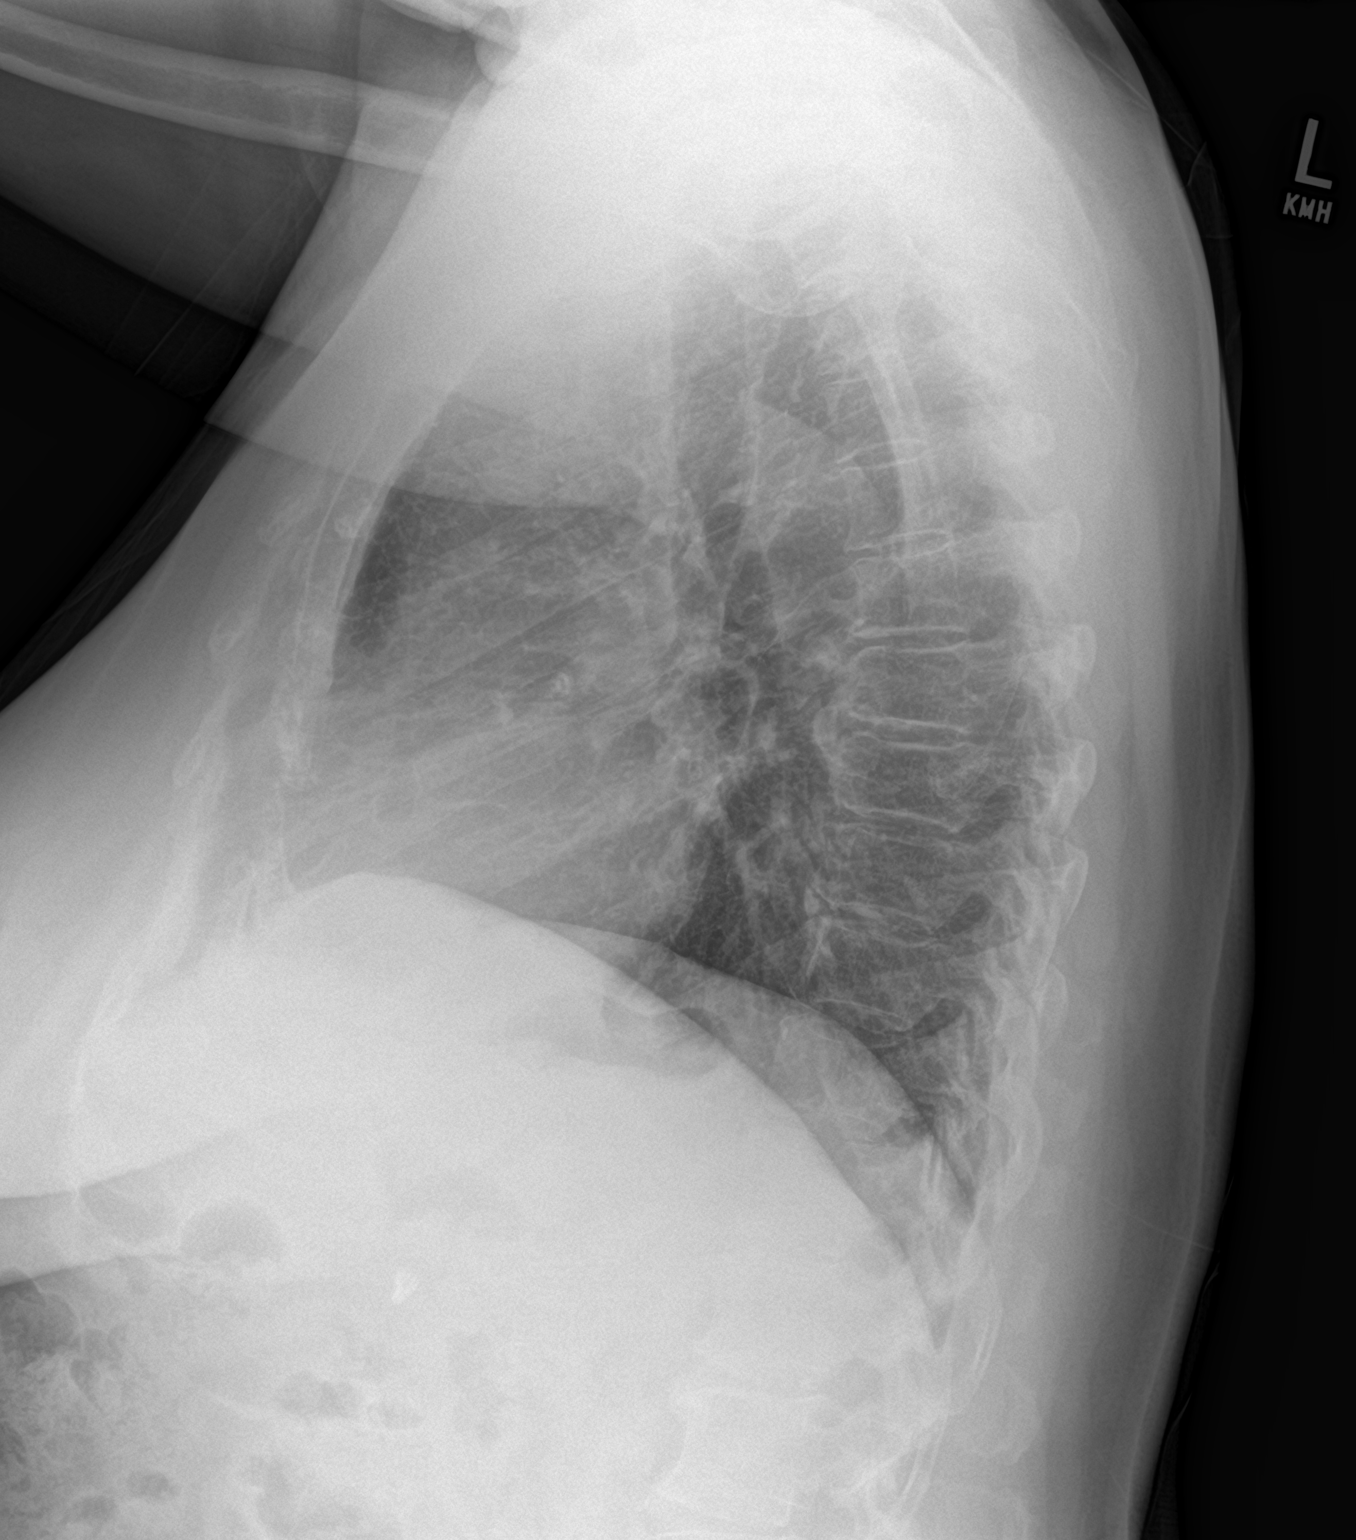

[2 of 2 positions shown; findings below may reference images not displayed]

FINDINGS: No active infiltrate or effusion is seen. No evidence of metastatic
involvement of the lungs is seen. Mediastinal and hilar contours are
unremarkable. Heart is within normal limits in size. There are mild
degenerative changes in the thoracic spine.
IMPRESSION: No active cardiopulmonary disease. No evidence of metastatic
involvement of the lungs.

## 2020-03-06 DIAGNOSIS — H353131 Nonexudative age-related macular degeneration, bilateral, early dry stage: Secondary | ICD-10-CM | POA: Diagnosis not present

## 2020-03-06 DIAGNOSIS — H524 Presbyopia: Secondary | ICD-10-CM | POA: Diagnosis not present

## 2020-03-06 DIAGNOSIS — Z794 Long term (current) use of insulin: Secondary | ICD-10-CM | POA: Diagnosis not present

## 2020-03-06 DIAGNOSIS — E103293 Type 1 diabetes mellitus with mild nonproliferative diabetic retinopathy without macular edema, bilateral: Secondary | ICD-10-CM | POA: Diagnosis not present

## 2020-03-06 DIAGNOSIS — Z961 Presence of intraocular lens: Secondary | ICD-10-CM | POA: Diagnosis not present

## 2020-03-09 DIAGNOSIS — I129 Hypertensive chronic kidney disease with stage 1 through stage 4 chronic kidney disease, or unspecified chronic kidney disease: Secondary | ICD-10-CM | POA: Diagnosis not present

## 2020-03-09 DIAGNOSIS — N184 Chronic kidney disease, stage 4 (severe): Secondary | ICD-10-CM | POA: Diagnosis not present

## 2020-03-09 DIAGNOSIS — E1122 Type 2 diabetes mellitus with diabetic chronic kidney disease: Secondary | ICD-10-CM | POA: Diagnosis not present

## 2020-03-19 ENCOUNTER — Ambulatory Visit (INDEPENDENT_AMBULATORY_CARE_PROVIDER_SITE_OTHER): Payer: Medicare Other | Admitting: Gastroenterology

## 2020-03-21 ENCOUNTER — Ambulatory Visit (INDEPENDENT_AMBULATORY_CARE_PROVIDER_SITE_OTHER): Payer: Medicare Other | Admitting: Gastroenterology

## 2020-03-28 ENCOUNTER — Ambulatory Visit (INDEPENDENT_AMBULATORY_CARE_PROVIDER_SITE_OTHER): Payer: Medicare Other | Admitting: Gastroenterology

## 2020-04-02 ENCOUNTER — Other Ambulatory Visit (INDEPENDENT_AMBULATORY_CARE_PROVIDER_SITE_OTHER): Payer: Self-pay

## 2020-04-02 ENCOUNTER — Encounter (INDEPENDENT_AMBULATORY_CARE_PROVIDER_SITE_OTHER): Payer: Self-pay

## 2020-04-02 ENCOUNTER — Other Ambulatory Visit: Payer: Self-pay

## 2020-04-02 ENCOUNTER — Ambulatory Visit (INDEPENDENT_AMBULATORY_CARE_PROVIDER_SITE_OTHER): Payer: Medicare Other | Admitting: Gastroenterology

## 2020-04-02 ENCOUNTER — Encounter (INDEPENDENT_AMBULATORY_CARE_PROVIDER_SITE_OTHER): Payer: Self-pay | Admitting: Gastroenterology

## 2020-04-02 VITALS — BP 145/75 | HR 76 | Temp 98.2°F | Ht 64.5 in | Wt 175.0 lb

## 2020-04-02 DIAGNOSIS — R140 Abdominal distension (gaseous): Secondary | ICD-10-CM | POA: Diagnosis not present

## 2020-04-02 DIAGNOSIS — K317 Polyp of stomach and duodenum: Secondary | ICD-10-CM | POA: Diagnosis not present

## 2020-04-02 MED ORDER — ONDANSETRON HCL 4 MG PO TABS: ORAL_TABLET | ORAL | 1 refills | Status: DC

## 2020-04-02 NOTE — Progress Notes (Signed)
Patient profile: Angela Harris is a 74 y.o. female seen for follow up. Last seen 6/20221 for GERD, had EGD 10/2019 and 12/2019 as below.   History of Present Illness: Angela Harris is seen today for follow-up.  She reports overall symptoms are stable since her last visit.  She endorses chronic nausea, most prominent in the mornings.  Seems to improve with eating.  No GERD and no dysphagia.  She denies epigastric pain.  Biggest concern today seems to be gas-reports feeling bloated after some meals and has a foul odor to gas.  She feels this limits going out to eat meals given passing gas after meals.  Bowel habits previously were loose stools with some urgency, since starting [place] she has had more intermittent constipation.  PCP prescribed MiraLAX which she has not started yet.  She denies any blood in the stool.  Occasional left lower quadrant discomfort but states it does not feel like diverticulitis.  She is unsure if the left lower quadrant abd discomfort improves with defecation, she will watch for this.  Denies nsaids, tobacco, alcohol. Has some weight loss recently - began when DM medications were changed.   Wt Readings from Last 3 Encounters:  04/02/20 175 lb (79.4 kg)  02/13/20 179 lb (81.2 kg)  01/10/20 183 lb (83 kg)     Last Colonoscopy:  05/2019-multiple polyps 3-23mm ascending, 51mm polyp rectosigmoid, 2mm polyp at 40cm. Moderate diverticulosis sigmoid.      Last Endoscopy: 12/2019 - Normal hypopharynx. - Normal esophagus. - Z-line regular. - A single gastric polyp at distal body(small) . Resected and retrieved. - A single gastric polyp at proximal body(large). Resected and retrieved. Clips (MR conditional) were placed. - A single gastric polyp. Clip (MR conditional) was placed at base. - A single gastric polyp could not be removed bacause of location. - Normal duodenal bulb and second portion of the duodenum.  A. STOMACH, DISTAL, POLYPECTOMY:  -  Hyperplastic gastric polyp with inflammation and foci of erosion.  - No adenomatous change or carcinoma.   B. STOMACH, PROXIMAL, POLYPECTOMY:  - Fragments of hyperplastic gastric polyp.  - No adenomatous change or carcinoma.    Past Medical History:  Past Medical History:  Diagnosis Date  . Anemia   . CKD (chronic kidney disease) stage 3, GFR 30-59 ml/min (HCC)   . Depression   . Dysrhythmia    LBBB  . Essential hypertension   . GERD (gastroesophageal reflux disease)   . History of TIA (transient ischemic attack) 2015  . Hyperlipidemia   . Meniere disease    pt denies  . Peripheral neuropathy   . PONV (postoperative nausea and vomiting)   . Stroke [facility]) 20015   "mini-stroke". No deficits from this  . Type 2 diabetes mellitus ([place])     Problem List: Patient Active Problem List   Diagnosis Date Noted  . Diverticulitis of colon 06/06/2018  . Change in stool 06/06/2018  . Renal mass 06/18/2017  . Trigger ring finger of right hand 04/12/2017  . Carpal tunnel syndrome of right wrist   . Closed fracture of lower end of right radius with malunion   . Type 2 diabetes mellitus with stage 3 chronic kidney disease, with long-term current use of insulin ([place]) 01/06/2016  . Type 2 diabetes mellitus with vascular disease ([place]) 11/06/2015  . Hyperlipidemia 11/06/2015  . Essential hypertension, benign 11/06/2015    Past Surgical History: Past Surgical History:  Procedure Laterality Date  . ABDOMINAL HYSTERECTOMY    .  BACK SURGERY     ruptured disc lower back  . BIOPSY  11/02/2019   Procedure: BIOPSY;  Surgeon: Rogene Houston, MD;  [facility];  Service: Endoscopy;;  gastric  . CARPAL TUNNEL RELEASE Right 08/20/2016   Procedure: CARPAL TUNNEL RELEASE;  Surgeon: Carole Civil, MD;  Location: [facility];  Service: Orthopedics;  Laterality: Right;  . CATARACT EXTRACTION, BILATERAL    . CESAREAN SECTION    . CHOLECYSTECTOMY    . Closed reduction right wrist fracture  Right   . ECTOPIC PREGNANCY SURGERY    . ESOPHAGEAL DILATION N/A 11/02/2019   Procedure: ESOPHAGEAL DILATION;  Surgeon: Rogene Houston, MD;  [facility]: [facility];  Service: Endoscopy;  Laterality: N/A;  . ESOPHAGOGASTRODUODENOSCOPY N/A 11/02/2019   Procedure: ESOPHAGOGASTRODUODENOSCOPY (EGD);  Surgeon: Rogene Houston, MD;  [facility];  Service: Endoscopy;  Laterality: N/A;  125  . ESOPHAGOGASTRODUODENOSCOPY (EGD) WITH PROPOFOL N/A 12/13/2019   Procedure: ESOPHAGOGASTRODUODENOSCOPY (EGD) WITH PROPOFOL;  Surgeon: Rogene Houston, MD;  [facility]: [facility];  Service: Endoscopy;  Laterality: N/A;  1255  . ORIF WRIST FRACTURE Right 02/26/2016   Procedure: OPEN REDUCTION INTERNAL FIXATION (ORIF) WRIST FRACTURE;  Surgeon: Carole Civil, MD;  Location: [facility];  Service: Orthopedics;  Laterality: Right;  TIME 12:30  . POLYPECTOMY N/A 12/13/2019   Procedure: POLYPECTOMY;  Surgeon: Rogene Houston, MD;  Location: [facility];  Service: Endoscopy;  Laterality: N/A;  . ROBOTIC ASSITED PARTIAL NEPHRECTOMY Left 06/18/2017   Procedure: XI ROBOTIC ASSITED PARTIAL NEPHRECTOMY;  Surgeon: Alexis Frock, MD;  Location: [facility];  Service: Urology;  Laterality: Left;  . TONSILLECTOMY      Allergies: No Known Allergies    Home Medications:  Current Outpatient Medications:  .  acetaminophen (TYLENOL) 500 MG tablet, Take 1,000 mg by mouth every 6 (six) hours as needed for mild pain., Disp: , Rfl:  .  amLODipine (NORVASC) 10 MG tablet, Take 10 mg by mouth daily., Disp: , Rfl:  .  aspirin EC 81 MG tablet, Take 1 tablet (81 mg total) by mouth at bedtime., Disp: 30 tablet, Rfl: 11 .  atenolol (TENORMIN) 50 MG tablet, Take 50 mg by mouth daily., Disp: , Rfl:  .  Calcium Carb-Cholecalciferol [phone] MG-UNIT CAPS, Take 1 tablet by mouth daily. , Disp: , Rfl:  .  gabapentin (NEURONTIN) 300 MG capsule, Take 300 mg by mouth 2 (two) times daily., Disp: , Rfl:  .  insulin degludec (TRESIBA  FLEXTOUCH) 100 UNIT/ML FlexTouch Pen, Inject 88 Units into the skin daily. , Disp: , Rfl:  .  lisinopril (PRINIVIL,ZESTRIL) 40 MG tablet, Take 40 mg by mouth daily., Disp: , Rfl:  .  Multiple Vitamins-Minerals (MULTIVITAMIN ADULTS PO), Take 1 tablet by mouth daily. , Disp: , Rfl:  .  ondansetron (ZOFRAN) 4 MG tablet, TAKE 1 TABLET BY MOUTH TWICE DAILY AS NEEDED FOR NAUSEA AND VOMITING, Disp: 30 tablet, Rfl: 1 .  ONE TOUCH ULTRA TEST test strip, USE ONE STRIP TO CHECK GLUCOSE 4 TIMES DAILY, Disp: 150 each, Rfl: 5 .  pantoprazole (PROTONIX) 40 MG tablet, Take 40 mg by mouth 2 (two) times daily. , Disp: , Rfl:  .  Semaglutide,0.25 or 0.5MG /DOS, (OZEMPIC, 0.25 OR 0.5 MG/DOSE,) 2 MG/1.5ML SOPN, Inject 0.5 mg into the skin once a week. , Disp: , Rfl:  .  simvastatin (ZOCOR) 40 MG tablet, Take 40 mg by mouth at bedtime., Disp: , Rfl:  .  venlafaxine XR (EFFEXOR-XR)  75 MG 24 hr capsule, Take 75 mg by mouth daily with breakfast., Disp: , Rfl:    Family History: family history includes Alcoholism in her brother, father, and sister; Alzheimer's disease in her mother; Hypertension in her brother, father, mother, and sister; Thyroid disease in her mother.    Social History:   reports that she has never smoked. She has never used smokeless tobacco. She reports that she does not drink alcohol and does not use drugs.   Review of Systems: Constitutional: Denies weight loss/weight gain  Eyes: No changes in vision. ENT: No oral lesions, sore throat.  GI: see HPI.  Heme/Lymph: No easy bruising.  CV: No chest pain.  GU: No hematuria.  Integumentary: No rashes.  Neuro: No headaches.  Psych: No depression/anxiety.  Endocrine: No heat/cold intolerance.  Allergic/Immunologic: No urticaria.  Resp: No cough, SOB.  Musculoskeletal: No joint swelling.    Physical Examination: BP (!) 145/75 (BP Location: Right Arm, Patient Position: Sitting, Cuff Size: Large)   Pulse 76   Temp 98.2 F (36.8 C) (Oral)   Ht 5'  4.5" (1.638 m)   Wt 175 lb (79.4 kg)   BMI 29.57 kg/m  Gen: NAD, alert and oriented x 4 HEENT: PEERLA, EOMI, Neck: supple, no JVD Chest: CTA bilaterally, no wheezes, crackles, or other adventitious sounds CV: RRR, no m/g/c/r Abd: soft, NT, ND, +BS in all four quadrants; no HSM, guarding, ridigity, or rebound tenderness Ext: no edema, well perfused with 2+ pulses, Skin: no rash or lesions noted on observed skin Lymph: no noted LAD  Data Reviewed:   10/2019--CMP w/ Cr 1.3 otherwise normal   Assessment/Plan: Ms. Dickerson is a 73 y.o. female seen for follow-up.  1. Gas/bloating-we discussed role of trying a probiotic.  She wants to try Phazyme as needed.  Has mild constipation and discussed routine use of MiraLAX titrated for soft daily stool.  If symptoms continue she will notify me.  Does have diabetes increasing risk for gastroparesis  2. Gastric polyps - EGD 12/2019 with hyperplastic polyps without adenomatous changes removed, ulcerated 85mm polyp in cardia not removed due to difficult position.  Repeat endoscopy scheduled for removal. Case discussed w/ Dr Laural Golden  3. Nausea - chronic and very occasional, finds zofran helpful and will refill for PRN use. Given DM may have degree of gastroparesis but nausea seems to improve w/ foods.    Tekeya was seen today for follow-up.  Diagnoses and all orders for this visit:  Multiple gastric polyps  Bloating  Other orders -     ondansetron (ZOFRAN) 4 MG tablet; TAKE 1 TABLET BY MOUTH TWICE DAILY AS NEEDED FOR NAUSEA AND VOMITING     Patient denies CP, SOB, and use of blood thinners. I discussed the risks and benefits of procedure including bleeding, perforation, infection, missed lesions, medication reactions and possible hospitalization or surgery if complications. All questions answered.   I personally performed the service, non-incident to. (WP)  Laurine Blazer, [facility]

## 2020-04-02 NOTE — Patient Instructions (Addendum)
Can try Align or [employer] - these are probiotics  Try miralax - small amount titrated daily for daily soft BM  Phazyme - as needed for gas - should be over the counter beside gas

## 2020-04-29 NOTE — Patient Instructions (Addendum)
67    Your procedure is scheduled on: !@/22/2021  Report to [facility] at   11:15  AM.  Call this number if you have problems the morning of surgery: [phone]   Remember:   Follow instructions on letter from office regarding when to stop eating and drinking        No Smoking the day of procedure      Take these medicines the morning of surgery with A SIP OF WATER: Amlodipine, atenolo, pantoprazole, and effexor.Take 44 untis of your [place] tonight.  No diabetic medication am of procedure   Do not wear jewelry, make-up or nail polish.  Do not wear lotions, powders, or perfumes. You may wear deodorant.                Do not bring valuables to the hospital.  Contacts, dentures or bridgework may not be worn into surgery.  Leave suitcase in the car. After surgery it may be brought to your room.  For patients admitted to the hospital, checkout time is 11:00 AM the day of discharge.   Patients discharged the day of surgery will not be allowed to drive home. Upper Endoscopy, Adult Upper endoscopy is a procedure to look inside the upper GI (gastrointestinal) tract. The upper GI tract is made up of:  The part of the body that moves food from your mouth to your stomach (esophagus).  The stomach.  The first part of your small intestine (duodenum). This procedure is also called esophagogastroduodenoscopy (EGD) or gastroscopy. In this procedure, your health care provider passes a thin, flexible tube (endoscope) through your mouth and down your esophagus into your stomach. A small camera is attached to the end of the tube. Images from the camera appear on a monitor in the exam room. During this procedure, your health care provider may also remove a small piece of tissue to be sent to a lab and examined under a microscope (biopsy). Your health care provider may do an upper endoscopy to diagnose cancers of the upper GI tract. You may also have this procedure to find the cause of other conditions,  such as:  Stomach pain.  Heartburn.  Pain or problems when swallowing.  Nausea and vomiting.  Stomach bleeding.  Stomach ulcers. Tell a health care provider about:  Any allergies you have.  All medicines you are taking, including vitamins, herbs, eye drops, creams, and over-the-counter medicines.  Any problems you or family members have had with anesthetic medicines.  Any blood disorders you have.  Any surgeries you have had.  Any medical conditions you have.  Whether you are pregnant or may be pregnant. What are the risks? Generally, this is a safe procedure. However, problems may occur, including:  Infection.  Bleeding.  Allergic reactions to medicines.  A tear or hole (perforation) in the esophagus, stomach, or duodenum. What happens before the procedure? Staying hydrated Follow instructions from your health care provider about hydration, which may include:  Up to 2 hours before the procedure - you may continue to drink clear liquids, such as water, clear fruit juice, black coffee, and plain tea.  Eating and drinking restrictions Follow instructions from your health care provider about eating and drinking, which may include:  8 hours before the procedure - stop eating heavy meals or foods, such as meat, fried foods, or fatty foods.  6 hours before the procedure - stop eating light meals or foods, such as toast or cereal.  6 hours before the procedure -  stop drinking milk or drinks that contain milk.  2 hours before the procedure - stop drinking clear liquids. Medicines Ask your health care provider about:  Changing or stopping your regular medicines. This is especially important if you are taking diabetes medicines or blood thinners.  Taking medicines such as aspirin and ibuprofen. These medicines can thin your blood. Do not take these medicines unless your health care provider tells you to take them.  Taking over-the-counter medicines, vitamins, herbs,  and supplements. General instructions  Plan to have someone take you home from the hospital or clinic.  If you will be going home right after the procedure, plan to have someone with you for 24 hours.  Ask your health care provider what steps will be taken to help prevent infection. What happens during the procedure?  1. An IV will be inserted into one of your veins. 2. You may be given one or more of the following: ? A medicine to help you relax (sedative). ? A medicine to numb the throat (local anesthetic). 3. You will lie on your left side on an exam table. 4. Your health care provider will pass the endoscope through your mouth and down your esophagus. 5. Your health care provider will use the scope to check the inside of your esophagus, stomach, and duodenum. Biopsies may be taken. 6. The endoscope will be removed. The procedure may vary among health care providers and hospitals. What happens after the procedure?  Your blood pressure, heart rate, breathing rate, and blood oxygen level will be monitored until you leave the hospital or clinic.  Do not drive for 24 hours if you were given a sedative during your procedure.  When your throat is no longer numb, you may be given some fluids to drink.  It is up to you to get the results of your procedure. Ask your health care provider, or the department that is doing the procedure, when your results will be ready. Summary  Upper endoscopy is a procedure to look inside the upper GI tract.  During the procedure, an IV will be inserted into one of your veins. You may be given a medicine to help you relax.  A medicine will be used to numb your throat.  The endoscope will be passed through your mouth and down your esophagus. This information is not intended to replace advice given to you by your health care provider. Make sure you discuss any questions you have with your health care provider. Document Revised: 10/20/2017 Document  Reviewed: 09/27/2017 Elsevier Patient Education  [place] After  Please read the instructions outlined below and refer to this sheet in the next few weeks. These discharge instructions provide you with general  information on caring for yourself after you leave the hospital. Your doctor may also give you specific instructions. While your treatment has been planned according to the most current medical practices available, unavoidable complications occasionally occur. If you have any problems or questions after discharge, please call your doctor. HOME CARE INSTRUCTIONS Activity  You may resume your regular activity but move at a slower pace for the next 24 hours.   Take frequent rest periods for the next 24 hours.   Walking will help expel (get rid of) the air and reduce the bloated feeling in your abdomen.   No driving for 24 hours (because of the anesthesia (medicine) used during the test).   You may shower.   Do not sign any important legal documents or operate any machinery for 24 hours (because of the anesthesia used during the test).  Nutrition  Drink plenty of fluids.   You may resume your normal diet.   Begin with a light meal and progress to your normal diet.   Avoid alcoholic beverages for 24 hours or as instructed by your caregiver.  Medications You may resume your normal medications unless your caregiver tells you otherwise. What you can expect today  You may experience abdominal discomfort such as a feeling of fullness or "gas" pains.   You may experience a sore throat for 2 to 3 days. This is normal. Gargling with salt water may help this.  Follow-up Your doctor will discuss the results of your test with you. SEEK IMMEDIATE MEDICAL CARE IF:  You have excessive nausea (feeling sick to your stomach) and/or  vomiting.   You have severe abdominal pain and distention (swelling).   You have trouble swallowing.   You have a temperature over 100 F (37.8 C).   You have rectal bleeding or vomiting of blood.  Document Released: 12/10/2003 Document Revised: 04/16/2011 Document Reviewed: 06/22/2007

## 2020-04-30 ENCOUNTER — Encounter (HOSPITAL_COMMUNITY): Payer: Self-pay

## 2020-04-30 ENCOUNTER — Encounter (HOSPITAL_COMMUNITY)
Admission: RE | Admit: 2020-04-30 | Discharge: 2020-04-30 | Disposition: A | Discharge status: Home / Self Care | Payer: Medicare Other | Source: Ambulatory Visit | Attending: Internal Medicine | Admitting: Internal Medicine

## 2020-04-30 ENCOUNTER — Other Ambulatory Visit (HOSPITAL_COMMUNITY)
Admission: RE | Admit: 2020-04-30 | Discharge: 2020-04-30 | Disposition: A | Discharge status: Home / Self Care | Payer: Medicare Other | Source: Ambulatory Visit | Attending: Internal Medicine | Admitting: Internal Medicine

## 2020-04-30 ENCOUNTER — Other Ambulatory Visit: Payer: Self-pay

## 2020-04-30 DIAGNOSIS — Z01818 Encounter for other preprocedural examination: Secondary | ICD-10-CM | POA: Insufficient documentation

## 2020-04-30 DIAGNOSIS — I10 Essential (primary) hypertension: Secondary | ICD-10-CM | POA: Insufficient documentation

## 2020-04-30 DIAGNOSIS — Z20822 Contact with and (suspected) exposure to covid-19: Secondary | ICD-10-CM | POA: Diagnosis not present

## 2020-04-30 LAB — BASIC METABOLIC PANEL
Anion gap: 10 (ref 5–15)
BUN: 19 mg/dL (ref 8–23)
CO2: 27 mmol/L (ref 22–32)
Calcium: 9.9 mg/dL (ref 8.9–10.3)
Chloride: 101 mmol/L (ref 98–111)
Creatinine, Ser: 1.08 mg/dL — ABNORMAL HIGH (ref 0.44–1.00)
GFR, Estimated: 55 mL/min — ABNORMAL LOW (ref >60)
Glucose, Bld: 111 mg/dL — ABNORMAL HIGH (ref 70–99)
Potassium: 3.7 mmol/L (ref 3.5–5.1)
Sodium: 138 mmol/L (ref 135–145)

## 2020-04-30 LAB — SARS CORONAVIRUS 2 (TAT 6-24 HRS): SARS Coronavirus 2: NEGATIVE

## 2020-05-01 ENCOUNTER — Ambulatory Visit (HOSPITAL_COMMUNITY): Payer: Medicare Other | Admitting: Anesthesiology

## 2020-05-01 ENCOUNTER — Telehealth (INDEPENDENT_AMBULATORY_CARE_PROVIDER_SITE_OTHER): Payer: Self-pay

## 2020-05-01 ENCOUNTER — Encounter (HOSPITAL_COMMUNITY): Payer: Self-pay | Admitting: Internal Medicine

## 2020-05-01 ENCOUNTER — Ambulatory Visit (HOSPITAL_COMMUNITY)
Admission: RE | Admit: 2020-05-01 | Discharge: 2020-05-01 | Disposition: A | Discharge status: Home / Self Care | Payer: Medicare Other | Attending: Internal Medicine | Admitting: Internal Medicine

## 2020-05-01 ENCOUNTER — Encounter (HOSPITAL_COMMUNITY)
Admission: RE | Disposition: A | Discharge status: Home / Self Care | Payer: Self-pay | Source: Home / Self Care | Attending: Internal Medicine

## 2020-05-01 ENCOUNTER — Other Ambulatory Visit: Payer: Self-pay

## 2020-05-01 DIAGNOSIS — Z8673 Personal history of transient ischemic attack (TIA), and cerebral infarction without residual deficits: Secondary | ICD-10-CM | POA: Diagnosis not present

## 2020-05-01 DIAGNOSIS — E1122 Type 2 diabetes mellitus with diabetic chronic kidney disease: Secondary | ICD-10-CM | POA: Diagnosis not present

## 2020-05-01 DIAGNOSIS — T182XXA Foreign body in stomach, initial encounter: Secondary | ICD-10-CM | POA: Diagnosis not present

## 2020-05-01 DIAGNOSIS — K449 Diaphragmatic hernia without obstruction or gangrene: Secondary | ICD-10-CM | POA: Diagnosis not present

## 2020-05-01 DIAGNOSIS — K317 Polyp of stomach and duodenum: Secondary | ICD-10-CM | POA: Insufficient documentation

## 2020-05-01 DIAGNOSIS — Z09 Encounter for follow-up examination after completed treatment for conditions other than malignant neoplasm: Secondary | ICD-10-CM | POA: Diagnosis not present

## 2020-05-01 DIAGNOSIS — Z79899 Other long term (current) drug therapy: Secondary | ICD-10-CM | POA: Diagnosis not present

## 2020-05-01 DIAGNOSIS — Z794 Long term (current) use of insulin: Secondary | ICD-10-CM | POA: Insufficient documentation

## 2020-05-01 DIAGNOSIS — Z7982 Long term (current) use of aspirin: Secondary | ICD-10-CM | POA: Insufficient documentation

## 2020-05-01 DIAGNOSIS — N189 Chronic kidney disease, unspecified: Secondary | ICD-10-CM | POA: Diagnosis not present

## 2020-05-01 DIAGNOSIS — I864 Gastric varices: Secondary | ICD-10-CM

## 2020-05-01 DIAGNOSIS — I129 Hypertensive chronic kidney disease with stage 1 through stage 4 chronic kidney disease, or unspecified chronic kidney disease: Secondary | ICD-10-CM | POA: Diagnosis not present

## 2020-05-01 DIAGNOSIS — R140 Abdominal distension (gaseous): Secondary | ICD-10-CM | POA: Diagnosis not present

## 2020-05-01 HISTORY — PX: ESOPHAGOGASTRODUODENOSCOPY (EGD) WITH PROPOFOL: SHX5813

## 2020-05-01 LAB — GLUCOSE, CAPILLARY: Glucose-Capillary: 105 mg/dL — ABNORMAL HIGH (ref 70–99)

## 2020-05-01 SURGERY — ESOPHAGOGASTRODUODENOSCOPY (EGD) WITH PROPOFOL
Anesthesia: General

## 2020-05-01 MED ORDER — LACTATED RINGERS IV SOLN: INTRAVENOUS | Status: DC | PRN

## 2020-05-01 MED ORDER — KETAMINE HCL 10 MG/ML IJ SOLN
INTRAMUSCULAR | Status: DC | PRN
  Administered 2020-05-01: 10 mg via INTRAVENOUS
  Administered 2020-05-01: 5 mg via INTRAVENOUS

## 2020-05-01 MED ORDER — PROPOFOL 500 MG/50ML IV EMUL
INTRAVENOUS | Status: DC | PRN
  Administered 2020-05-01: 140 ug/kg/min via INTRAVENOUS

## 2020-05-01 MED ORDER — LIDOCAINE HCL (CARDIAC) PF 100 MG/5ML IV SOSY
PREFILLED_SYRINGE | INTRAVENOUS | Status: DC | PRN
  Administered 2020-05-01: 80 mg via INTRAVENOUS

## 2020-05-01 MED ORDER — KETAMINE HCL 50 MG/5ML IJ SOSY
PREFILLED_SYRINGE | INTRAMUSCULAR | Status: AC
  Filled 2020-05-01: qty 5

## 2020-05-01 MED ORDER — PROPOFOL 10 MG/ML IV BOLUS
INTRAVENOUS | Status: DC | PRN
  Administered 2020-05-01: 40 mg via INTRAVENOUS
  Administered 2020-05-01: 50 mg via INTRAVENOUS

## 2020-05-01 NOTE — Anesthesia Postprocedure Evaluation (Signed)
Anesthesia Post Note  Patient: Angela Harris  Procedure(s) Performed: ESOPHAGOGASTRODUODENOSCOPY (EGD) WITH PROPOFOL (N/A )  Patient location during evaluation: PACU Anesthesia Type: General Level of consciousness: awake Pain management: pain level controlled Vital Signs Assessment: post-procedure vital signs reviewed and stable Respiratory status: spontaneous breathing Cardiovascular status: stable Postop Assessment: no apparent nausea or vomiting Anesthetic complications: no   No complications documented.   Last Vitals:  Vitals:   05/01/20 1140  BP: (!) 162/69  Pulse: 71  Resp: 12  Temp: (!) 36.3 C  SpO2: 98%    Last Pain:  Vitals:   05/01/20 1249  TempSrc:   PainSc: 0-No pain                 Everette Rank

## 2020-05-01 NOTE — Telephone Encounter (Signed)
LeighAnn Kwaku Mostafa, CMA  

## 2020-05-01 NOTE — Op Note (Signed)
[facility] Patient Name: Angela Harris Procedure Date: 05/01/2020 12:44 PM MRN: [id number] Date of Birth: January 17, 1946 Attending MD: Hildred Laser , MD CSN: [id number] Age: 74 Admit Type: Outpatient Procedure:                Upper GI endoscopy Indications:              For therapy of gastric polyps Providers:                Hildred Laser, MD, Otis Peak B. Sharon Seller, RN, Raphael Gibney, Technician Referring MD:             Mitzie Na. Quillian Quince, MD Medicines:                Propofol per Anesthesia Complications:            No immediate complications. Estimated Blood Loss:     Estimated blood loss: none. Procedure:                Pre-Anesthesia Assessment:                           - Prior to the procedure, a History and Physical                            was performed, and patient medications and                            allergies were reviewed. The patient's tolerance of                            previous anesthesia was also reviewed. The risks                            and benefits of the procedure and the sedation                            options and risks were discussed with the patient.                            All questions were answered, and informed consent                            was obtained. Prior Anticoagulants: The patient has                            taken no previous anticoagulant or antiplatelet                            agents except for aspirin. ASA Grade Assessment:                            III - A patient with severe systemic disease. After  reviewing the risks and benefits, the patient was                            deemed in satisfactory condition to undergo the                            procedure.                           After obtaining informed consent, the endoscope was                            passed under direct vision. Throughout the                            procedure, the patient's  blood pressure, pulse, and                            oxygen saturations were monitored continuously. The                            [id number]-[id number] ([id number]) scope was introduced through the                            mouth, and advanced to the second part of duodenum.                            The upper GI endoscopy was accomplished without                            difficulty. The patient tolerated the procedure                            well. Scope In: 12:53:24 PM Scope Out: 12:57:22 PM Total Procedure Duration: 0 hours 3 minutes 58 seconds  Findings:      The examined esophagus was normal.      The Z-line was regular and was found 38 cm from the incisors.      A 2 cm hiatal hernia was present.      A large amount of food (residue) was found in the gastric fundus, in the       gastric body and in the gastric antrum.      Three pedunculated and sessile polyps were found in the cardia, in the       gastric fundus and in the gastric body.      The pylorus was normal.      The duodenal bulb and second portion of the duodenum were normal. Impression:               - Normal esophagus.                           - Z-line regular, 38 cm from the incisors.                           - 2 cm hiatal hernia.                           -  A large amount of food (residue) in the stomach.                           - Three gastric polyps. These polyps were not                            removed because stomach was full of food debris.                           - Normal pylorus.                           - Normal duodenal bulb and second portion of the                            duodenum.                           - No specimens collected. Moderate Sedation:      Per Anesthesia Care Recommendation:           - Patient has a contact number available for                            emergencies. The signs and symptoms of potential                            delayed complications were discussed with the                             patient. Return to normal activities tomorrow.                            Written discharge instructions were provided to the                            patient.                           - Clear liquid diet today thereafter modified carb                            low-fat diet.                           - Continue present medications.                           -Solid-phase gastric emptying study to be scheduled.                           - Repeat upper endoscopy at appointment to be                            scheduled. Procedure Code(s):        --- Professional ---  15953, Esophagogastroduodenoscopy, flexible,                            transoral; diagnostic, including collection of                            specimen(s) by brushing or washing, when performed                            (separate procedure) Diagnosis Code(s):        --- Professional ---                           K44.9, Diaphragmatic hernia without obstruction or                            gangrene                           K31.7, Polyp of stomach and duodenum CPT copyright 2019 [employer] Association. All rights reserved. The codes documented in this report are preliminary and upon coder review may  be revised to meet current compliance requirements. Hildred Laser, MD Hildred Laser, MD 05/01/2020 1:10:03 PM This report has been signed electronically. Number of Addenda: 0

## 2020-05-01 NOTE — Anesthesia Preprocedure Evaluation (Signed)
Anesthesia Evaluation  Patient identified by MRN, date of birth, ID band Patient awake    Reviewed: Allergy & Precautions, NPO status , Patient's Chart, lab work & pertinent test results, reviewed documented beta blocker date and time   History of Anesthesia Complications (+) PONV and history of anesthetic complications  Airway Mallampati: II  TM Distance: >3 FB Neck ROM: Full    Dental  (+) Missing Veneers :   Pulmonary shortness of breath and with exertion,    Pulmonary exam normal breath sounds clear to auscultation       Cardiovascular Exercise Tolerance: Good hypertension, Pt. on medications and Pt. on home beta blockers Normal cardiovascular exam+ dysrhythmias  Rhythm:Regular Rate:Normal - Systolic murmurs, - Diastolic murmurs, - Friction Rub, - Carotid Bruit, - Peripheral Edema and - Systolic Click EKG- LBBB   Neuro/Psych PSYCHIATRIC DISORDERS Depression TIA Neuromuscular disease CVA, No Residual Symptoms    GI/Hepatic GERD  Medicated and Controlled,  Endo/Other  diabetes, Well Controlled, Type 2, Oral Hypoglycemic Agents  Renal/GU Renal InsufficiencyRenal disease     Musculoskeletal negative musculoskeletal ROS (+)   Abdominal   Peds  Hematology  (+) anemia ,   Anesthesia Other Findings   Reproductive/Obstetrics negative OB ROS                             Anesthesia Physical  Anesthesia Plan  ASA: III  Anesthesia Plan: General   Post-op Pain Management:    Induction: Intravenous  PONV Risk Score and Plan: TIVA  Airway Management Planned: Nasal Cannula, Natural Airway and Simple Face Mask  Additional Equipment:   Intra-op Plan:   Post-operative Plan:   Informed Consent: I have reviewed the patients History and Physical, chart, labs and discussed the procedure including the risks, benefits and alternatives for the proposed anesthesia with the patient or authorized  representative who has indicated his/her understanding and acceptance.     Dental advisory given  Plan Discussed with: CRNA and Surgeon  Anesthesia Plan Comments:        Anesthesia Quick Evaluation

## 2020-05-01 NOTE — Transfer of Care (Signed)
Immediate Anesthesia Transfer of Care Note  Patient: Angela Harris  Procedure(s) Performed: ESOPHAGOGASTRODUODENOSCOPY (EGD) WITH PROPOFOL (N/A )  Patient Location: PACU  Anesthesia Type:MAC and General  Level of Consciousness: drowsy and patient cooperative  Airway & Oxygen Therapy: Patient Spontanous Breathing  Post-op Assessment: Report given to RN and Post -op Vital signs reviewed and stable  Post vital signs: Reviewed and stable  Last Vitals:  Vitals Value Taken Time  BP    Temp    Pulse 70 05/01/20 1304  Resp 19 05/01/20 1304  SpO2 95 % 05/01/20 1304  Vitals shown include unvalidated device data.  Last Pain:  Vitals:   05/01/20 1249  TempSrc:   PainSc: 0-No pain      Patients Stated Pain Goal: 8 (24/49/75 3005)  Complications: No complications documented.

## 2020-05-01 NOTE — H&P (Signed)
Angela Harris is an 74 y.o. female.   Chief Complaint: Patient is here for esophagogastroduodenoscopy with gastric polypectomy HPI: Patient is 74 year old Caucasian female who underwent esophagogastroduodenoscopy for dysphagia back in June 2021.  She was found to have gastric polyps with ulceration.  She returned for gastric polypectomy in August 2021.  There were 2 ulcerated polyps just below the GE junction which could not removed.  Gastric polyps with ulceration were removed and turned out to be hyperplastic polyps.  She is not returning to help the remaining gastric polyps removed.  Patient denies nausea vomiting epigastric pain melena or rectal bleeding.  However she complains of bloating.  She noticed some relief with probiotic.  She says when she burps quite often is very foul-smelling. Her appetite is good and her weight has been stable.  Aspirin is on hold  Past Medical History:  Diagnosis Date  . Anemia   . CKD (chronic kidney disease) stage 3, GFR 30-59 ml/min (HCC)   . Depression   . Dysrhythmia    LBBB  . Essential hypertension   . GERD (gastroesophageal reflux disease)   . History of TIA (transient ischemic attack) 2015  . Hyperlipidemia   . Meniere disease    pt denies  . Peripheral neuropathy   . PONV (postoperative nausea and vomiting)   . Stroke [facility]) [address]15   "mini-stroke". No deficits from this  . Type 2 diabetes mellitus ([place])     Past Surgical History:  Procedure Laterality Date  . ABDOMINAL HYSTERECTOMY    . BACK SURGERY     ruptured disc lower back  . BIOPSY  11/02/2019   Procedure: BIOPSY;  Surgeon: Rogene Houston, MD;  [facility];  Service: Endoscopy;;  gastric  . CARPAL TUNNEL RELEASE Right 08/20/2016   Procedure: CARPAL TUNNEL RELEASE;  Surgeon: Carole Civil, MD;  Location: [facility];  Service: Orthopedics;  Laterality: Right;  . CATARACT EXTRACTION, BILATERAL    . CESAREAN SECTION    . CHOLECYSTECTOMY    . Closed reduction right  wrist fracture Right   . ECTOPIC PREGNANCY SURGERY    . ESOPHAGEAL DILATION N/A 11/02/2019   Procedure: ESOPHAGEAL DILATION;  Surgeon: Rogene Houston, MD;  [facility];  Service: Endoscopy;  Laterality: N/A;  . ESOPHAGOGASTRODUODENOSCOPY N/A 11/02/2019   Procedure: ESOPHAGOGASTRODUODENOSCOPY (EGD);  Surgeon: Rogene Houston, MD;  [facility];  Service: Endoscopy;  Laterality: N/A;  125  . ESOPHAGOGASTRODUODENOSCOPY (EGD) WITH PROPOFOL N/A 12/13/2019   Procedure: ESOPHAGOGASTRODUODENOSCOPY (EGD) WITH PROPOFOL;  Surgeon: Rogene Houston, MD;  [facility];  Service: Endoscopy;  Laterality: N/A;  1255  . ORIF WRIST FRACTURE Right 02/26/2016   Procedure: OPEN REDUCTION INTERNAL FIXATION (ORIF) WRIST FRACTURE;  Surgeon: Carole Civil, MD;  Location: [facility];  Service: Orthopedics;  Laterality: Right;  TIME 12:30  . POLYPECTOMY N/A 12/13/2019   Procedure: POLYPECTOMY;  Surgeon: Rogene Houston, MD;  Location[facility];  Service: Endoscopy;  Laterality: N/A;  . ROBOTIC ASSITED PARTIAL NEPHRECTOMY Left 06/18/2017   Procedure: XI ROBOTIC ASSITED PARTIAL NEPHRECTOMY;  Surgeon: Alexis Frock, MD;  Location: [facility];  Service: Urology;  Laterality: Left;  . TONSILLECTOMY      Family History  Problem Relation Age of Onset  . Alzheimer's disease Mother   . Thyroid disease Mother   . Hypertension Mother   . Alcoholism Father   . Hypertension Father   . Alcoholism Sister   . Hypertension Sister   .  Alcoholism Brother   . Hypertension Brother    Social History:  reports that she has never smoked. She has never used smokeless tobacco. She reports that she does not drink alcohol and does not use drugs.  Allergies: No Known Allergies  Medications Prior to Admission  Medication Sig Dispense Refill  . acetaminophen (TYLENOL) 500 MG tablet Take 1,000 mg by mouth every 6 (six) hours as needed for mild pain.    Marland Kitchen amLODipine (NORVASC) 10 MG tablet Take 10 mg by  mouth daily.    . Ascorbic Acid (VITAMIN C) 1000 MG tablet Take 1,000 mg by mouth daily.    Marland Kitchen aspirin EC 81 MG tablet Take 1 tablet (81 mg total) by mouth at bedtime. 30 tablet 11  . atenolol (TENORMIN) 50 MG tablet Take 50 mg by mouth daily.    Marland Kitchen gabapentin (NEURONTIN) 300 MG capsule Take 300 mg by mouth 2 (two) times daily.    . insulin degludec (TRESIBA) 100 UNIT/ML FlexTouch Pen Inject 88 Units into the skin daily.     Marland Kitchen lisinopril (PRINIVIL,ZESTRIL) 40 MG tablet Take 40 mg by mouth daily.    . Multiple Vitamins-Minerals (MULTIVITAMIN ADULTS PO) Take 1 tablet by mouth daily.     . pantoprazole (PROTONIX) 40 MG tablet Take 40 mg by mouth 2 (two) times daily.     . Probiotic Product (PHILLIPS COLON HEALTH PO) Take 1 capsule by mouth daily.    . rosuvastatin (CRESTOR) 5 MG tablet Take 5 mg by mouth daily.    . Semaglutide,0.25 or 0.5MG /DOS, (OZEMPIC, 0.25 OR 0.5 MG/DOSE,) 2 MG/1.5ML SOPN Inject 0.5 mg into the skin once a week.     . venlafaxine XR (EFFEXOR-XR) 75 MG 24 hr capsule Take 75 mg by mouth daily with breakfast.    . ONE TOUCH ULTRA TEST test strip USE ONE STRIP TO CHECK GLUCOSE 4 TIMES DAILY 150 each 5    Results for orders placed or performed during the hospital encounter of 05/01/20 (from the past 48 hour(s))  Glucose, capillary     Status: Abnormal   Collection Time: 05/01/20 11:35 AM  Result Value Ref Range   Glucose-Capillary 105 (H) 70 - 99 mg/dL    Comment: Glucose reference range applies only to samples taken after fasting for at least 8 hours.   No results found.  Review of Systems  Blood pressure (!) 162/69, pulse 71, temperature (!) 97.3 F (36.3 C), temperature source Oral, resp. rate 12, height 5\' 4"  (1.626 m), weight 81 kg, SpO2 98 %. Physical Exam HENT:     Mouth/Throat:     Mouth: Mucous membranes are moist.     Pharynx: Oropharynx is clear.  Eyes:     General: No scleral icterus.    Conjunctiva/sclera: Conjunctivae normal.  Cardiovascular:     Rate  and Rhythm: Normal rate and regular rhythm.     Heart sounds: Normal heart sounds. No murmur heard.   Pulmonary:     Effort: Pulmonary effort is normal.     Breath sounds: Normal breath sounds.  Abdominal:     Comments: Abdomen is full.  On palpation is soft and nontender with organomegaly or masses  Musculoskeletal:        General: No swelling.     Cervical back: Neck supple.  Lymphadenopathy:     Cervical: No cervical adenopathy.  Skin:    General: Skin is warm and dry.  Neurological:     Mental Status: She is alert.  Assessment/Plan  Gastric polyps. History of foul-smelling burps.  Gastroparesis suspected.  To be further evaluated with gastric emptying study. Esophagogastroduodenoscopy with gastric polypectomy.  Hildred Laser, MD 05/01/2020, 12:42 PM

## 2020-05-01 NOTE — Discharge Instructions (Signed)
Upper Endoscopy, Adult, Care After This sheet gives you information about how to care for yourself after your procedure. Your health care provider may also give you more specific instructions. If you have problems or questions, contact your health care provider. What can I expect after the procedure? After the procedure, it is common to have:  A sore throat.  Mild stomach pain or discomfort.  Bloating.  Nausea. Follow these instructions at home:   Follow instructions from your health care provider about what to eat or drink after your procedure.  Return to your normal activities as told by your health care provider. Ask your health care provider what activities are safe for you.  Take over-the-counter and prescription medicines only as told by your health care provider.  Do not drive for 24 hours if you were given a sedative during your procedure.  Keep all follow-up visits as told by your health care provider. This is important. Contact a health care provider if you have:  A sore throat that lasts longer than one day.  Trouble swallowing. Get help right away if:  You vomit blood or your vomit looks like coffee grounds.  You have: ? A fever. ? Bloody, black, or tarry stools. ? A severe sore throat or you cannot swallow. ? Difficulty breathing. ? Severe pain in your chest or abdomen. Summary  After the procedure, it is common to have a sore throat, mild stomach discomfort, bloating, and nausea.  Do not drive for 24 hours if you were given a sedative during the procedure.  Follow instructions from your health care provider about what to eat or drink after your procedure.  Return to your normal activities as told by your health care provider. This information is not intended to replace advice given to you by your health care provider. Make sure you discuss any questions you have with your health care provider. Document Revised: 10/19/2017 Document Reviewed:  09/27/2017 Elsevier Patient Education  [place]. PATIENT INSTRUCTIONS POST-ANESTHESIA  IMMEDIATELY FOLLOWING SURGERY:  Do not drive or operate machinery for the first twenty four hours after surgery.  Do not make any important decisions for twenty four hours after surgery or while taking narcotic pain medications or sedatives.  If you develop intractable nausea and vomiting or a severe headache please notify your doctor immediately.  FOLLOW-UP:  Please make an appointment with your surgeon as instructed. You do not need to follow up with anesthesia unless specifically instructed to do so.  WOUND CARE INSTRUCTIONS (if applicable):  Keep a dry clean dressing on the anesthesia/puncture wound site if there is drainage.  Once the wound has quit draining you may leave it open to air.  Generally you should leave the bandage intact for twenty four hours unless there is drainage.  If the epidural site drains for more than 36-48 hours please call the anesthesia department.  QUESTIONS?:  Please feel free to call your physician or the hospital operator if you have any questions, and they will be happy to assist you.      Resume usual medications including aspirin as before Clear liquids today and thereafter modified carb low-fat diet. No driving for 24 hours. Gastric emptying study to be scheduled.  Office will call.

## 2020-05-02 NOTE — Addendum Note (Signed)
Addendum  created 05/02/20 1207 by Karna Dupes, CRNA   Charge Capture section accepted

## 2020-05-09 ENCOUNTER — Encounter (HOSPITAL_COMMUNITY): Payer: Self-pay | Admitting: Internal Medicine

## 2020-05-10 DIAGNOSIS — E1122 Type 2 diabetes mellitus with diabetic chronic kidney disease: Secondary | ICD-10-CM | POA: Diagnosis not present

## 2020-05-10 DIAGNOSIS — N184 Chronic kidney disease, stage 4 (severe): Secondary | ICD-10-CM | POA: Diagnosis not present

## 2020-05-10 DIAGNOSIS — I129 Hypertensive chronic kidney disease with stage 1 through stage 4 chronic kidney disease, or unspecified chronic kidney disease: Secondary | ICD-10-CM | POA: Diagnosis not present

## 2020-05-13 ENCOUNTER — Other Ambulatory Visit: Payer: Self-pay

## 2020-05-13 ENCOUNTER — Encounter (HOSPITAL_COMMUNITY)
Admission: RE | Admit: 2020-05-13 | Discharge: 2020-05-13 | Disposition: A | Discharge status: Home / Self Care | Payer: Medicare Other | Source: Ambulatory Visit | Attending: Internal Medicine | Admitting: Internal Medicine

## 2020-05-13 DIAGNOSIS — Z8679 Personal history of other diseases of the circulatory system: Secondary | ICD-10-CM | POA: Diagnosis not present

## 2020-05-13 DIAGNOSIS — I864 Gastric varices: Secondary | ICD-10-CM | POA: Diagnosis not present

## 2020-05-13 DIAGNOSIS — K30 Functional dyspepsia: Secondary | ICD-10-CM | POA: Diagnosis not present

## 2020-05-13 MED ORDER — TECHNETIUM TC 99M SULFUR COLLOID
2.0000 | Freq: Once | INTRAVENOUS | Status: AC | PRN
  Administered 2020-05-13: 1.9 via ORAL

## 2020-05-14 DIAGNOSIS — Z23 Encounter for immunization: Secondary | ICD-10-CM | POA: Diagnosis not present

## 2020-05-20 DIAGNOSIS — E1122 Type 2 diabetes mellitus with diabetic chronic kidney disease: Secondary | ICD-10-CM | POA: Diagnosis not present

## 2020-05-20 DIAGNOSIS — N183 Chronic kidney disease, stage 3 unspecified: Secondary | ICD-10-CM | POA: Diagnosis not present

## 2020-05-20 DIAGNOSIS — I10 Essential (primary) hypertension: Secondary | ICD-10-CM | POA: Diagnosis not present

## 2020-05-20 DIAGNOSIS — R946 Abnormal results of thyroid function studies: Secondary | ICD-10-CM | POA: Diagnosis not present

## 2020-05-20 DIAGNOSIS — E782 Mixed hyperlipidemia: Secondary | ICD-10-CM | POA: Diagnosis not present

## 2020-05-20 DIAGNOSIS — E1165 Type 2 diabetes mellitus with hyperglycemia: Secondary | ICD-10-CM | POA: Diagnosis not present

## 2020-05-20 DIAGNOSIS — E1142 Type 2 diabetes mellitus with diabetic polyneuropathy: Secondary | ICD-10-CM | POA: Diagnosis not present

## 2020-05-23 DIAGNOSIS — E782 Mixed hyperlipidemia: Secondary | ICD-10-CM | POA: Diagnosis not present

## 2020-05-23 DIAGNOSIS — K2100 Gastro-esophageal reflux disease with esophagitis, without bleeding: Secondary | ICD-10-CM | POA: Diagnosis not present

## 2020-05-23 DIAGNOSIS — Z6829 Body mass index (BMI) 29.0-29.9, adult: Secondary | ICD-10-CM | POA: Diagnosis not present

## 2020-05-23 DIAGNOSIS — F331 Major depressive disorder, recurrent, moderate: Secondary | ICD-10-CM | POA: Diagnosis not present

## 2020-05-23 DIAGNOSIS — E1122 Type 2 diabetes mellitus with diabetic chronic kidney disease: Secondary | ICD-10-CM | POA: Diagnosis not present

## 2020-05-23 DIAGNOSIS — D500 Iron deficiency anemia secondary to blood loss (chronic): Secondary | ICD-10-CM | POA: Diagnosis not present

## 2020-05-23 DIAGNOSIS — I10 Essential (primary) hypertension: Secondary | ICD-10-CM | POA: Diagnosis not present

## 2020-06-08 DIAGNOSIS — I129 Hypertensive chronic kidney disease with stage 1 through stage 4 chronic kidney disease, or unspecified chronic kidney disease: Secondary | ICD-10-CM | POA: Diagnosis not present

## 2020-06-08 DIAGNOSIS — N184 Chronic kidney disease, stage 4 (severe): Secondary | ICD-10-CM | POA: Diagnosis not present

## 2020-06-08 DIAGNOSIS — K219 Gastro-esophageal reflux disease without esophagitis: Secondary | ICD-10-CM | POA: Diagnosis not present

## 2020-06-08 DIAGNOSIS — E1122 Type 2 diabetes mellitus with diabetic chronic kidney disease: Secondary | ICD-10-CM | POA: Diagnosis not present

## 2020-06-10 DIAGNOSIS — Z20828 Contact with and (suspected) exposure to other viral communicable diseases: Secondary | ICD-10-CM | POA: Diagnosis not present

## 2020-06-18 ENCOUNTER — Other Ambulatory Visit: Payer: Self-pay

## 2020-06-18 ENCOUNTER — Encounter: Payer: Medicare Other | Attending: Family Medicine | Admitting: Nutrition

## 2020-06-18 DIAGNOSIS — E1122 Type 2 diabetes mellitus with diabetic chronic kidney disease: Secondary | ICD-10-CM | POA: Insufficient documentation

## 2020-06-18 DIAGNOSIS — E1159 Type 2 diabetes mellitus with other circulatory complications: Secondary | ICD-10-CM

## 2020-06-18 DIAGNOSIS — N1832 Chronic kidney disease, stage 3b: Secondary | ICD-10-CM

## 2020-06-18 DIAGNOSIS — Z794 Long term (current) use of insulin: Secondary | ICD-10-CM | POA: Insufficient documentation

## 2020-06-18 DIAGNOSIS — N184 Chronic kidney disease, stage 4 (severe): Secondary | ICD-10-CM | POA: Insufficient documentation

## 2020-06-18 DIAGNOSIS — I10 Essential (primary) hypertension: Secondary | ICD-10-CM

## 2020-06-18 DIAGNOSIS — E782 Mixed hyperlipidemia: Secondary | ICD-10-CM

## 2020-06-18 NOTE — Patient Instructions (Signed)
Goals Set by patient.  Continue to be careful what you're eating Increase vegetables with lunch and dinner Measure protein with lunch and dinner and make sure getting 4 oz. Keep drinking 100 oz of water per day. Lose 2 lbs per month Increase exercise with dancing as tolerating. Slowing increase higher fiber foods as tolerated. Keep up the [place]!!!!

## 2020-06-18 NOTE — Progress Notes (Signed)
  Medical Nutrition Therapy:  Appt start time: 1430 end time: 1500  Assessment:  Primary concerns today: Diabetes Type 2 and CKD Stg 4. Creatine 1.41 and eGFR < 37. Lost 11 lbs in the last year.  Most recently A1C was 6.7%. Did have HPylori in the past. Was told she has gastroparesis per Dr. Laural Golden. Still taking Ozempic for her DM.  Tresiba 100 units because she is out of her Ozempic per her pharmist recommendations at [place] FBS 69-80's. At night it's 200's.  Really working on eating breakfast now and eating meals on time. Taking meals to work. Has more energy throughout the day Drinking unsweet green tea and water Ozempic has really helped.  Doesn't have to go to the bathroom too often now.. Use to dance a lot before covid. Starting to dance for exercise in her house.    Dr. Laural Golden EGD.  Dr. In [facility]. Had some polps in stomach and colon. Will get that done soon since she had food in her system and couldn't do it. Had a gastric emptying study showed gastroparesis.  Preferred Learning Style  No preference indicated   Learning Readiness:   Ready  Change in progress  MEDICATIONS:    DIETARY INTAKE:   24-hr recall:  B ( AM):yogurt activa, egg and 1 slice of toast,  L)  Kellogs vanilla almond, 2% milk, pineapple, cottage cheese, unsweet tea D) [place] sandwich with whole wheat bread, few chips, unsweet tea   Usual physical activity: dance a little  Estimated energy needs: 1200  calories 135 g carbohydrates  90g protein 33 g fat  Progress Towards Goal(s):  In progress.   Nutritional Diagnosis:  NB-1.1 Food and nutrition-related knowledge deficit As related to Diabetes Type 2.  As evidenced by A1C .    Intervention:  Nutrition and Diabetes education provided on My Plate, CHO counting, meal planning, portion sizes, timing of meals, avoiding snacks between meals unless having a low blood sugar, target ranges for A1C and blood sugars,  signs/symptoms and treatment of hyper/hypoglycemia, monitoring blood sugars, taking medications as prescribed, benefits of exercising 30 minutes per day and prevention of complications of DM.Discused Stg 4 CKD and need for low salt and monitor potassium and phosphorus intake. Follow a low salt diet. Goals Set by patient.  Continue to be careful what you're eating Increase vegetables with lunch and dinner Measure protein with lunch and dinner and make sure getting 4 oz. Keep drinking 100 oz of water per day. Lose 2 lbs per month Increase exercise with dancing as tolerating. Slowing increase higher fiber foods as tolerated. Keep up the [place]!!!!    Teaching Method Utilized:  Visual Auditory Hands on  Handouts given during visit include:    Barriers to learning/adherence to lifestyle change: none  Demonstrated degree of understanding via:  Teach Back   Monitoring/Evaluation:  Dietary intake, exercise, , and body weight in 3 month(s).

## 2020-07-03 DIAGNOSIS — H353131 Nonexudative age-related macular degeneration, bilateral, early dry stage: Secondary | ICD-10-CM | POA: Diagnosis not present

## 2020-07-08 DIAGNOSIS — I129 Hypertensive chronic kidney disease with stage 1 through stage 4 chronic kidney disease, or unspecified chronic kidney disease: Secondary | ICD-10-CM | POA: Diagnosis not present

## 2020-07-08 DIAGNOSIS — E1122 Type 2 diabetes mellitus with diabetic chronic kidney disease: Secondary | ICD-10-CM | POA: Diagnosis not present

## 2020-07-08 DIAGNOSIS — K219 Gastro-esophageal reflux disease without esophagitis: Secondary | ICD-10-CM | POA: Diagnosis not present

## 2020-07-08 DIAGNOSIS — N184 Chronic kidney disease, stage 4 (severe): Secondary | ICD-10-CM | POA: Diagnosis not present

## 2020-07-15 ENCOUNTER — Encounter: Payer: Self-pay | Admitting: Nutrition

## 2020-08-07 DIAGNOSIS — I129 Hypertensive chronic kidney disease with stage 1 through stage 4 chronic kidney disease, or unspecified chronic kidney disease: Secondary | ICD-10-CM | POA: Diagnosis not present

## 2020-08-07 DIAGNOSIS — N184 Chronic kidney disease, stage 4 (severe): Secondary | ICD-10-CM | POA: Diagnosis not present

## 2020-08-07 DIAGNOSIS — E1122 Type 2 diabetes mellitus with diabetic chronic kidney disease: Secondary | ICD-10-CM | POA: Diagnosis not present

## 2020-08-07 DIAGNOSIS — K219 Gastro-esophageal reflux disease without esophagitis: Secondary | ICD-10-CM | POA: Diagnosis not present

## 2020-08-26 DIAGNOSIS — Z20828 Contact with and (suspected) exposure to other viral communicable diseases: Secondary | ICD-10-CM | POA: Diagnosis not present

## 2020-08-26 DIAGNOSIS — J209 Acute bronchitis, unspecified: Secondary | ICD-10-CM | POA: Diagnosis not present

## 2020-08-26 DIAGNOSIS — R059 Cough, unspecified: Secondary | ICD-10-CM | POA: Diagnosis not present

## 2020-09-05 ENCOUNTER — Ambulatory Visit (HOSPITAL_COMMUNITY)
Admission: RE | Admit: 2020-09-05 | Discharge: 2020-09-05 | Disposition: A | Discharge status: Home / Self Care | Payer: Medicare Other | Source: Ambulatory Visit | Attending: Urology | Admitting: Urology

## 2020-09-05 ENCOUNTER — Other Ambulatory Visit (HOSPITAL_COMMUNITY): Payer: Self-pay | Admitting: Urology

## 2020-09-05 ENCOUNTER — Other Ambulatory Visit: Payer: Self-pay

## 2020-09-05 DIAGNOSIS — C642 Malignant neoplasm of left kidney, except renal pelvis: Secondary | ICD-10-CM

## 2020-09-05 DIAGNOSIS — D4101 Neoplasm of uncertain behavior of right kidney: Secondary | ICD-10-CM | POA: Diagnosis not present

## 2020-09-05 DIAGNOSIS — K5750 Diverticulosis of both small and large intestine without perforation or abscess without bleeding: Secondary | ICD-10-CM | POA: Diagnosis not present

## 2020-09-05 DIAGNOSIS — Z85528 Personal history of other malignant neoplasm of kidney: Secondary | ICD-10-CM | POA: Diagnosis not present

## 2020-09-05 DIAGNOSIS — K439 Ventral hernia without obstruction or gangrene: Secondary | ICD-10-CM | POA: Diagnosis not present

## 2020-09-05 DIAGNOSIS — N2889 Other specified disorders of kidney and ureter: Secondary | ICD-10-CM | POA: Diagnosis not present

## 2020-09-06 DIAGNOSIS — D4101 Neoplasm of uncertain behavior of right kidney: Secondary | ICD-10-CM | POA: Diagnosis not present

## 2020-09-06 DIAGNOSIS — C642 Malignant neoplasm of left kidney, except renal pelvis: Secondary | ICD-10-CM | POA: Diagnosis not present

## 2020-09-07 DIAGNOSIS — I129 Hypertensive chronic kidney disease with stage 1 through stage 4 chronic kidney disease, or unspecified chronic kidney disease: Secondary | ICD-10-CM | POA: Diagnosis not present

## 2020-09-07 DIAGNOSIS — E1122 Type 2 diabetes mellitus with diabetic chronic kidney disease: Secondary | ICD-10-CM | POA: Diagnosis not present

## 2020-09-07 DIAGNOSIS — N184 Chronic kidney disease, stage 4 (severe): Secondary | ICD-10-CM | POA: Diagnosis not present

## 2020-09-07 DIAGNOSIS — K219 Gastro-esophageal reflux disease without esophagitis: Secondary | ICD-10-CM | POA: Diagnosis not present

## 2020-09-11 DIAGNOSIS — D649 Anemia, unspecified: Secondary | ICD-10-CM | POA: Diagnosis not present

## 2020-09-11 DIAGNOSIS — E876 Hypokalemia: Secondary | ICD-10-CM | POA: Diagnosis not present

## 2020-09-11 DIAGNOSIS — E782 Mixed hyperlipidemia: Secondary | ICD-10-CM | POA: Diagnosis not present

## 2020-09-11 DIAGNOSIS — R5383 Other fatigue: Secondary | ICD-10-CM | POA: Diagnosis not present

## 2020-09-11 DIAGNOSIS — E1122 Type 2 diabetes mellitus with diabetic chronic kidney disease: Secondary | ICD-10-CM | POA: Diagnosis not present

## 2020-09-11 DIAGNOSIS — D529 Folate deficiency anemia, unspecified: Secondary | ICD-10-CM | POA: Diagnosis not present

## 2020-09-11 DIAGNOSIS — E7849 Other hyperlipidemia: Secondary | ICD-10-CM | POA: Diagnosis not present

## 2020-09-11 DIAGNOSIS — D500 Iron deficiency anemia secondary to blood loss (chronic): Secondary | ICD-10-CM | POA: Diagnosis not present

## 2020-09-11 DIAGNOSIS — N183 Chronic kidney disease, stage 3 unspecified: Secondary | ICD-10-CM | POA: Diagnosis not present

## 2020-09-18 DIAGNOSIS — Z6828 Body mass index (BMI) 28.0-28.9, adult: Secondary | ICD-10-CM | POA: Diagnosis not present

## 2020-09-18 DIAGNOSIS — I10 Essential (primary) hypertension: Secondary | ICD-10-CM | POA: Diagnosis not present

## 2020-09-18 DIAGNOSIS — F331 Major depressive disorder, recurrent, moderate: Secondary | ICD-10-CM | POA: Diagnosis not present

## 2020-09-18 DIAGNOSIS — K2100 Gastro-esophageal reflux disease with esophagitis, without bleeding: Secondary | ICD-10-CM | POA: Diagnosis not present

## 2020-09-18 DIAGNOSIS — D500 Iron deficiency anemia secondary to blood loss (chronic): Secondary | ICD-10-CM | POA: Diagnosis not present

## 2020-09-18 DIAGNOSIS — E7849 Other hyperlipidemia: Secondary | ICD-10-CM | POA: Diagnosis not present

## 2020-09-18 DIAGNOSIS — Z7189 Other specified counseling: Secondary | ICD-10-CM | POA: Diagnosis not present

## 2020-09-18 DIAGNOSIS — E1122 Type 2 diabetes mellitus with diabetic chronic kidney disease: Secondary | ICD-10-CM | POA: Diagnosis not present

## 2020-10-07 DIAGNOSIS — N184 Chronic kidney disease, stage 4 (severe): Secondary | ICD-10-CM | POA: Diagnosis not present

## 2020-10-07 DIAGNOSIS — I129 Hypertensive chronic kidney disease with stage 1 through stage 4 chronic kidney disease, or unspecified chronic kidney disease: Secondary | ICD-10-CM | POA: Diagnosis not present

## 2020-10-07 DIAGNOSIS — E1122 Type 2 diabetes mellitus with diabetic chronic kidney disease: Secondary | ICD-10-CM | POA: Diagnosis not present

## 2020-10-07 DIAGNOSIS — K219 Gastro-esophageal reflux disease without esophagitis: Secondary | ICD-10-CM | POA: Diagnosis not present

## 2020-11-07 DIAGNOSIS — I129 Hypertensive chronic kidney disease with stage 1 through stage 4 chronic kidney disease, or unspecified chronic kidney disease: Secondary | ICD-10-CM | POA: Diagnosis not present

## 2020-11-07 DIAGNOSIS — K219 Gastro-esophageal reflux disease without esophagitis: Secondary | ICD-10-CM | POA: Diagnosis not present

## 2020-11-07 DIAGNOSIS — N184 Chronic kidney disease, stage 4 (severe): Secondary | ICD-10-CM | POA: Diagnosis not present

## 2020-11-07 DIAGNOSIS — E1122 Type 2 diabetes mellitus with diabetic chronic kidney disease: Secondary | ICD-10-CM | POA: Diagnosis not present

## 2020-12-08 DIAGNOSIS — I129 Hypertensive chronic kidney disease with stage 1 through stage 4 chronic kidney disease, or unspecified chronic kidney disease: Secondary | ICD-10-CM | POA: Diagnosis not present

## 2020-12-08 DIAGNOSIS — K219 Gastro-esophageal reflux disease without esophagitis: Secondary | ICD-10-CM | POA: Diagnosis not present

## 2020-12-08 DIAGNOSIS — N184 Chronic kidney disease, stage 4 (severe): Secondary | ICD-10-CM | POA: Diagnosis not present

## 2020-12-08 DIAGNOSIS — E1122 Type 2 diabetes mellitus with diabetic chronic kidney disease: Secondary | ICD-10-CM | POA: Diagnosis not present

## 2020-12-18 DIAGNOSIS — Z20828 Contact with and (suspected) exposure to other viral communicable diseases: Secondary | ICD-10-CM | POA: Diagnosis not present

## 2020-12-18 DIAGNOSIS — J069 Acute upper respiratory infection, unspecified: Secondary | ICD-10-CM | POA: Diagnosis not present

## 2020-12-18 DIAGNOSIS — K2100 Gastro-esophageal reflux disease with esophagitis, without bleeding: Secondary | ICD-10-CM | POA: Diagnosis not present

## 2020-12-18 DIAGNOSIS — R140 Abdominal distension (gaseous): Secondary | ICD-10-CM | POA: Diagnosis not present

## 2020-12-20 DIAGNOSIS — Z20828 Contact with and (suspected) exposure to other viral communicable diseases: Secondary | ICD-10-CM | POA: Diagnosis not present

## 2020-12-21 DIAGNOSIS — J069 Acute upper respiratory infection, unspecified: Secondary | ICD-10-CM | POA: Diagnosis not present

## 2021-01-24 DIAGNOSIS — I10 Essential (primary) hypertension: Secondary | ICD-10-CM | POA: Diagnosis not present

## 2021-01-24 DIAGNOSIS — E782 Mixed hyperlipidemia: Secondary | ICD-10-CM | POA: Diagnosis not present

## 2021-01-24 DIAGNOSIS — Z1329 Encounter for screening for other suspected endocrine disorder: Secondary | ICD-10-CM | POA: Diagnosis not present

## 2021-01-24 DIAGNOSIS — N184 Chronic kidney disease, stage 4 (severe): Secondary | ICD-10-CM | POA: Diagnosis not present

## 2021-01-24 DIAGNOSIS — E1122 Type 2 diabetes mellitus with diabetic chronic kidney disease: Secondary | ICD-10-CM | POA: Diagnosis not present

## 2021-01-24 DIAGNOSIS — E1165 Type 2 diabetes mellitus with hyperglycemia: Secondary | ICD-10-CM | POA: Diagnosis not present

## 2021-01-24 DIAGNOSIS — E7849 Other hyperlipidemia: Secondary | ICD-10-CM | POA: Diagnosis not present

## 2021-01-24 DIAGNOSIS — K2100 Gastro-esophageal reflux disease with esophagitis, without bleeding: Secondary | ICD-10-CM | POA: Diagnosis not present

## 2021-01-28 DIAGNOSIS — D500 Iron deficiency anemia secondary to blood loss (chronic): Secondary | ICD-10-CM | POA: Diagnosis not present

## 2021-01-28 DIAGNOSIS — Z1331 Encounter for screening for depression: Secondary | ICD-10-CM | POA: Diagnosis not present

## 2021-01-28 DIAGNOSIS — E1122 Type 2 diabetes mellitus with diabetic chronic kidney disease: Secondary | ICD-10-CM | POA: Diagnosis not present

## 2021-01-28 DIAGNOSIS — Z23 Encounter for immunization: Secondary | ICD-10-CM | POA: Diagnosis not present

## 2021-01-28 DIAGNOSIS — I10 Essential (primary) hypertension: Secondary | ICD-10-CM | POA: Diagnosis not present

## 2021-01-28 DIAGNOSIS — Z0001 Encounter for general adult medical examination with abnormal findings: Secondary | ICD-10-CM | POA: Diagnosis not present

## 2021-01-28 DIAGNOSIS — E7849 Other hyperlipidemia: Secondary | ICD-10-CM | POA: Diagnosis not present

## 2021-01-28 DIAGNOSIS — Z1389 Encounter for screening for other disorder: Secondary | ICD-10-CM | POA: Diagnosis not present

## 2021-02-07 DIAGNOSIS — E1122 Type 2 diabetes mellitus with diabetic chronic kidney disease: Secondary | ICD-10-CM | POA: Diagnosis not present

## 2021-02-07 DIAGNOSIS — I129 Hypertensive chronic kidney disease with stage 1 through stage 4 chronic kidney disease, or unspecified chronic kidney disease: Secondary | ICD-10-CM | POA: Diagnosis not present

## 2021-02-07 DIAGNOSIS — K219 Gastro-esophageal reflux disease without esophagitis: Secondary | ICD-10-CM | POA: Diagnosis not present

## 2021-02-07 DIAGNOSIS — N184 Chronic kidney disease, stage 4 (severe): Secondary | ICD-10-CM | POA: Diagnosis not present

## 2021-02-11 DIAGNOSIS — M8588 Other specified disorders of bone density and structure, other site: Secondary | ICD-10-CM | POA: Diagnosis not present

## 2021-02-11 DIAGNOSIS — M810 Age-related osteoporosis without current pathological fracture: Secondary | ICD-10-CM | POA: Diagnosis not present

## 2021-03-25 DIAGNOSIS — H524 Presbyopia: Secondary | ICD-10-CM | POA: Diagnosis not present

## 2021-03-25 DIAGNOSIS — E103292 Type 1 diabetes mellitus with mild nonproliferative diabetic retinopathy without macular edema, left eye: Secondary | ICD-10-CM | POA: Diagnosis not present

## 2021-03-25 DIAGNOSIS — E103211 Type 1 diabetes mellitus with mild nonproliferative diabetic retinopathy with macular edema, right eye: Secondary | ICD-10-CM | POA: Diagnosis not present

## 2021-03-25 DIAGNOSIS — Z794 Long term (current) use of insulin: Secondary | ICD-10-CM | POA: Diagnosis not present

## 2021-03-25 DIAGNOSIS — H353212 Exudative age-related macular degeneration, right eye, with inactive choroidal neovascularization: Secondary | ICD-10-CM | POA: Diagnosis not present

## 2021-03-25 DIAGNOSIS — Z961 Presence of intraocular lens: Secondary | ICD-10-CM | POA: Diagnosis not present

## 2021-04-29 DIAGNOSIS — K2100 Gastro-esophageal reflux disease with esophagitis, without bleeding: Secondary | ICD-10-CM | POA: Diagnosis not present

## 2021-04-29 DIAGNOSIS — R197 Diarrhea, unspecified: Secondary | ICD-10-CM | POA: Diagnosis not present

## 2021-05-01 ENCOUNTER — Other Ambulatory Visit: Payer: Self-pay

## 2021-05-01 ENCOUNTER — Encounter (INDEPENDENT_AMBULATORY_CARE_PROVIDER_SITE_OTHER): Payer: Self-pay

## 2021-05-01 ENCOUNTER — Other Ambulatory Visit (INDEPENDENT_AMBULATORY_CARE_PROVIDER_SITE_OTHER): Payer: Self-pay

## 2021-05-01 ENCOUNTER — Ambulatory Visit (INDEPENDENT_AMBULATORY_CARE_PROVIDER_SITE_OTHER): Payer: Medicare Other | Admitting: Gastroenterology

## 2021-05-01 ENCOUNTER — Encounter (INDEPENDENT_AMBULATORY_CARE_PROVIDER_SITE_OTHER): Payer: Self-pay | Admitting: Gastroenterology

## 2021-05-01 VITALS — BP 136/81 | HR 72 | Temp 98.5°F | Ht 64.0 in | Wt 170.4 lb

## 2021-05-01 DIAGNOSIS — I700 Atherosclerosis of aorta: Secondary | ICD-10-CM | POA: Diagnosis not present

## 2021-05-01 DIAGNOSIS — Z01812 Encounter for preprocedural laboratory examination: Secondary | ICD-10-CM

## 2021-05-01 DIAGNOSIS — K5732 Diverticulitis of large intestine without perforation or abscess without bleeding: Secondary | ICD-10-CM

## 2021-05-01 DIAGNOSIS — R1032 Left lower quadrant pain: Secondary | ICD-10-CM | POA: Diagnosis not present

## 2021-05-01 DIAGNOSIS — R1030 Lower abdominal pain, unspecified: Secondary | ICD-10-CM | POA: Diagnosis not present

## 2021-05-01 DIAGNOSIS — R197 Diarrhea, unspecified: Secondary | ICD-10-CM

## 2021-05-01 DIAGNOSIS — R109 Unspecified abdominal pain: Secondary | ICD-10-CM | POA: Diagnosis not present

## 2021-05-01 DIAGNOSIS — K5730 Diverticulosis of large intestine without perforation or abscess without bleeding: Secondary | ICD-10-CM | POA: Diagnosis not present

## 2021-05-01 NOTE — Patient Instructions (Addendum)
I will order a CT of your abdomen to rule out diverticulitis as the cause of your symptoms, I am going to order stool studies as well so that you have the orders, but hold off on completing these until CT is done, if CT shows evidence of diverticulitis, we do not need to do stool studies.  I will be in touch with you regarding results of CT scan. Please let me know if you develop any new or worsening GI symptoms. Continue to stay well hydrated and use the rehydration packets your doctor gave you. You can also try doing the BRAT diet (bananas, rice, applesauce and toast). Avoid imodium for now.

## 2021-05-01 NOTE — Progress Notes (Signed)
Referring Provider: Caryl Bis, MD Primary Care Physician:  Caryl Bis, MD Primary GI Physician: Laural Golden  Chief Complaint  Patient presents with   Diarrhea    Diarrhea for about the past 10 days, having diarrhea every time she eats. Has only had some salt hydration packets and saltine crackers for past 2 days. Has tried domperidone and stopped because feet and ankles started swelling. Having some abdominal pain on both sides, no fever, no blood in stool, having urgency to go. Also tried pepto.    HPI:   Angela Harris is a 75 y.o. female with past medical history of anemia, CKD stage III, depression, dysrhythmia, HTN, GERD, TIA, HLD, Meniere's disease, neuropathy, stroke and Type II DM, gastroparesis.  Patient presenting today for diarrhea for the past 10 days. She reports that she is having about 3 episodes of diarrhea per day that is watery, typically occurring during the night worse after she eats. She has been eating minimal food, except crackers. She denies any rectal bleeding. She has noticed some black stools since onset of diarrhea but has been taking pepto bismol during acute illness. She has minimal abdominal pain. She does have some nausea in the mornings without vomiting. She denies fevers or chills. She denies any NSAID use. No recent antibiotics.  She reports that she will occasionally have some intermittent diarrhea that will last a few days and then subside, this occurs maybe once every two weeks. She reports that she has fecal urgency at times and occasionally will have fecal incontinence if she dose not make it to the restroom in time. She saw her PCP on Tuesday and was given some rehydration packets and zofran. She has not been drinking a lot of liquids, she feels that she is hungry at times but has been avoiding foods some due to the increase in diarrhea. She denies any recent NSAID use.   Patient previously diagnosed with gastroparesis after GES in January 2022  with delayed gastric emptying, she was advised to start on gastroparesis Diet and started on Domperidone 5mg  before each meal, however, she states that she waited a while to start that, she started It and took it for about 6 weeks with some relief of her symptoms, however, she could not tolerate the domperidone because it caused swelling to her feet and ankles.   Patient reports that she has a lot of belching and gas, she reports that sometimes her belches smell like rotten eggs. She denies much issue with acid reflux. She does have a history of IBS.  Last Colonoscopy:05/29/16 Dr. Anthony Sar, Normal Last Endoscopy:05/01/20- Normal esophagus. - Z-line regular, 38 cm from the incisors. - 2 cm hiatal hernia. - A large amount of food (residue) in the stomach. - Three gastric polyps. These polyps were not removed because stomach was full of food debris. - Normal pylorus. - Normal duodenal bulb and second portion of the duodenum. - No specimens collected. Gastric emptying study: 05/13/20 delayed gastric emptying  Past Medical History:  Diagnosis Date   Anemia    CKD (chronic kidney disease) stage 3, GFR 30-59 ml/min (HCC)    Depression    Dysrhythmia    LBBB   Essential hypertension    GERD (gastroesophageal reflux disease)    History of TIA (transient ischemic attack) 2015   Hyperlipidemia    Meniere disease    pt denies   Peripheral neuropathy    PONV (postoperative nausea and vomiting)    Stroke ([place]) [address]   "  mini-stroke". No deficits from this   Type 2 diabetes mellitus [facility])     Past Surgical History:  Procedure Laterality Date   ABDOMINAL HYSTERECTOMY     BACK SURGERY     ruptured disc lower back   BIOPSY  11/02/2019   Procedure: BIOPSY;  Surgeon: Rogene Houston, MD;  [facility];  Service: Endoscopy;;  gastric   CARPAL TUNNEL RELEASE Right 08/20/2016   Procedure: CARPAL TUNNEL RELEASE;  Surgeon: Carole Civil, MD;  Location: [facility];  Service: Orthopedics;   Laterality: Right;   CATARACT EXTRACTION, BILATERAL     CESAREAN SECTION     CHOLECYSTECTOMY     Closed reduction right wrist fracture Right    ECTOPIC PREGNANCY SURGERY     ESOPHAGEAL DILATION N/A 11/02/2019   Procedure: ESOPHAGEAL DILATION;  Surgeon: Rogene Houston, MD;  [facility];  Service: Endoscopy;  Laterality: N/A;   ESOPHAGOGASTRODUODENOSCOPY N/A 11/02/2019   Procedure: ESOPHAGOGASTRODUODENOSCOPY (EGD);  Surgeon: Rogene Houston, MD;  [facility];  Service: Endoscopy;  Laterality: N/A;  125   ESOPHAGOGASTRODUODENOSCOPY (EGD) WITH PROPOFOL N/A 12/13/2019   Procedure: ESOPHAGOGASTRODUODENOSCOPY (EGD) WITH PROPOFOL;  Surgeon: Rogene Houston, MD;  [facility];  Service: Endoscopy;  Laterality: N/A;  1255   ESOPHAGOGASTRODUODENOSCOPY (EGD) WITH PROPOFOL N/A 05/01/2020   Procedure: ESOPHAGOGASTRODUODENOSCOPY (EGD) WITH PROPOFOL;  Surgeon: Rogene Houston, MD;  [facility];  Service: Endoscopy;  Laterality: N/A;  2:00   ORIF WRIST FRACTURE Right 02/26/2016   Procedure: OPEN REDUCTION INTERNAL FIXATION (ORIF) WRIST FRACTURE;  Surgeon: Carole Civil, MD;  Location: [facility];  Service: Orthopedics;  Laterality: Right;  TIME 12:30   POLYPECTOMY N/A 12/13/2019   Procedure: POLYPECTOMY;  Surgeon: Rogene Houston, MD;  [facility];  Service: Endoscopy;  Laterality: N/A;   ROBOTIC ASSITED PARTIAL NEPHRECTOMY Left 06/18/2017   Procedure: XI ROBOTIC ASSITED PARTIAL NEPHRECTOMY;  Surgeon: Alexis Frock, MD;  Location: [facility];  Service: Urology;  Laterality: Left;   TONSILLECTOMY      Current Outpatient Medications  Medication Sig Dispense Refill   acetaminophen (TYLENOL) 500 MG tablet Take 1,000 mg by mouth every 6 (six) hours as needed for mild pain.     amLODipine (NORVASC) 10 MG tablet Take 10 mg by mouth daily.     Ascorbic Acid (VITAMIN C) 1000 MG tablet Take 1,000 mg by mouth daily.     aspirin EC 81 MG tablet Take 1 tablet (81  mg total) by mouth at bedtime. 30 tablet 11   atenolol (TENORMIN) 50 MG tablet Take 50 mg by mouth daily.     gabapentin (NEURONTIN) 300 MG capsule Take 300 mg by mouth 2 (two) times daily.     insulin degludec (TRESIBA) 100 UNIT/ML FlexTouch Pen Inject 76 Units into the skin daily.     lisinopril (PRINIVIL,ZESTRIL) 40 MG tablet Take 40 mg by mouth daily.     Multiple Vitamins-Minerals (MULTIVITAMIN ADULTS PO) Take 1 tablet by mouth daily.      ONE TOUCH ULTRA TEST test strip USE ONE STRIP TO CHECK GLUCOSE 4 TIMES DAILY 150 each 5   pantoprazole (PROTONIX) 40 MG tablet Take 40 mg by mouth 2 (two) times daily.      Probiotic Product (PHILLIPS COLON HEALTH PO) Take 1 capsule by mouth daily.     rosuvastatin (CRESTOR) 5 MG tablet Take 5 mg by mouth daily.     Semaglutide,0.25 or 0.5MG /DOS, (OZEMPIC, 0.25 OR  0.5 MG/DOSE,) 2 MG/1.5ML SOPN Inject 0.5 mg into the skin once a week.      venlafaxine XR (EFFEXOR-XR) 75 MG 24 hr capsule Take 75 mg by mouth daily with breakfast.     No current facility-administered medications for this visit.    Allergies as of 05/01/2021   (No Known Allergies)    Family History  Problem Relation Age of Onset   Alzheimer's disease Mother    Thyroid disease Mother    Hypertension Mother    Alcoholism Father    Hypertension Father    Alcoholism Sister    Hypertension Sister    Alcoholism Brother    Hypertension Brother     Social History   Socioeconomic History   Marital status: Divorced    Spouse name: Not on file   Number of children: Not on file   Years of education: Not on file   Highest education level: Not on file  Occupational History   Not on file  Tobacco Use   Smoking status: Never   Smokeless tobacco: Never  Vaping Use   Vaping Use: Never used  Substance and Sexual Activity   Alcohol use: No    Alcohol/week: 0.0 standard drinks   Drug use: No   Sexual activity: Never    Birth control/protection: Surgical  Other Topics Concern   Not  on file  Social History Narrative   Not on file   Social Determinants of Health   Financial Resource Strain: Not on file  Food Insecurity: Not on file  Transportation Needs: Not on file  Physical Activity: Not on file  Stress: Not on file  Social Connections: Not on file   Review of systems General: negative for malaise, night sweats, fever, chills, weight loss Neck: Negative for lumps, goiter, pain and significant neck swelling Resp: Negative for cough, wheezing, dyspnea at rest CV: Negative for chest pain, leg swelling, palpitations, orthopnea GI: denies melena, hematochezia, nausea, vomiting, constipation, dysphagia, odyonophagia, early satiety or unintentional weight loss. +diarrhea +belching +nausea MSK: Negative for joint pain or swelling, back pain, and muscle pain. Derm: Negative for itching or rash Psych: Denies depression, anxiety, memory loss, confusion. No homicidal or suicidal ideation.  Heme: Negative for prolonged bleeding, bruising easily, and swollen nodes. Endocrine: Negative for cold or heat intolerance, polyuria, polydipsia and goiter. Neuro: negative for tremor, gait imbalance, syncope and seizures. The remainder of the review of systems is noncontributory.  Physical Exam: BP 136/81 (BP Location: Right Arm, Patient Position: Sitting, Cuff Size: Large)    Pulse 72    Temp 98.5 F (36.9 C) (Oral)    Ht 5\' 4"  (1.626 m)    Wt 170 lb 6.4 oz (77.3 kg)    BMI 29.25 kg/m  General:   Alert and oriented. No distress noted. Pleasant and cooperative.  Head:  Normocephalic and atraumatic. Eyes:  Conjuctiva clear without scleral icterus. Mouth:  Oral mucosa pink and moist. Good dentition. No lesions. Heart: Normal rate and rhythm, s1 and s2 heart sounds present.  Lungs: Clear lung sounds in all lobes. Respirations equal and unlabored. Abdomen:  +BS, soft, and non-distended. TTP of RLQ, LLQ and suprapubic area. No rebound or guarding. No HSM or masses noted. Derm: No  palmar erythema or jaundice Msk:  Symmetrical without gross deformities. Normal posture. Extremities:  Without edema. Neurologic:  Alert and  oriented x4 Psych:  Alert and cooperative. Normal mood and affect.  Invalid input(s): 6 MONTHS   ASSESSMENT: Angela Harris is a  75 y.o. female presenting today for ongoing diarrhea.  Diarrhea for the past 10 days, she denies travel, recent sick contacts or recent antibiotics. She has history of diverticulitis, though she has not had much abdominal pain, she does have TTP of lower abdomen on exam.Differentials include viral gastroenteritis, diverticulitis, infectious colitis, H pylori.  We will proceed with CT A/P with contrast for further evaluation of this. If CT is negative, we will proceed with completing stool studies to ensure no infectious etiology as the cause of her diarrhea. Reassuringly she has had no vomiting, or rectal bleeding. She has had some dark/black stools, but I suspect these are secondary to use of pepto bismol use.    PLAN:  CT A/P with contrast r/o diverticulitis 2. Stool studies, will complete these if CT negative 3. BRAT diet 4. Continue to stay well hydrated, use rehydration packets   Follow Up: TBD  Teancum Brule L. Alver Sorrow, MSN, APRN, AGNP-C Adult-Gerontology Nurse Practitioner [facility]

## 2021-05-06 ENCOUNTER — Telehealth (INDEPENDENT_AMBULATORY_CARE_PROVIDER_SITE_OTHER): Payer: Self-pay

## 2021-05-06 ENCOUNTER — Other Ambulatory Visit (HOSPITAL_COMMUNITY)
Admission: RE | Admit: 2021-05-06 | Discharge: 2021-05-06 | Disposition: A | Discharge status: Home / Self Care | Payer: Medicare Other | Source: Ambulatory Visit | Attending: Internal Medicine | Admitting: Internal Medicine

## 2021-05-06 DIAGNOSIS — R197 Diarrhea, unspecified: Secondary | ICD-10-CM | POA: Insufficient documentation

## 2021-05-06 LAB — C DIFFICILE QUICK SCREEN W PCR REFLEX
C Diff antigen: NEGATIVE
C Diff interpretation: NOT DETECTED
C Diff toxin: NEGATIVE

## 2021-05-06 NOTE — Telephone Encounter (Signed)
Patient called wanting the results of her recent Stat CT scan done at [facility]. She states she was told to preform a stool test,but to wait until she heard from [place] regarding the Scan results.   Should she proceed with the stool testing?    What did scan show? Please advise.

## 2021-05-06 NOTE — Telephone Encounter (Signed)
Patient aware of all, and will proceed with the stools studies.

## 2021-05-07 ENCOUNTER — Telehealth (INDEPENDENT_AMBULATORY_CARE_PROVIDER_SITE_OTHER): Payer: Self-pay | Admitting: *Deleted

## 2021-05-07 LAB — GASTROINTESTINAL PANEL BY PCR, STOOL (REPLACES STOOL CULTURE)

## 2021-05-07 NOTE — Telephone Encounter (Signed)
error 

## 2021-05-07 NOTE — Telephone Encounter (Signed)
Pt seen by chelsea on 05/01/21 for ongoing diarrhea. Pt had ct at [facility] that she states she was negative. Turned in stool samples yesterday and results in epic but not reviewed yet. States she feels like she is getting dehydrated. Having cramps in legs. Drinking liquids but going straight through. No blood in stool, no fever, abdominal pain at times on right and left lower sides but states its not bad and not like the pain when she has diverticulitis. Has some nausea. Tried pepto but stopped when she found out it could make stools dark. States she may go one day and not have diarrhea and then it goes back to diarrhea the next day.   [phone]

## 2021-05-08 ENCOUNTER — Other Ambulatory Visit (INDEPENDENT_AMBULATORY_CARE_PROVIDER_SITE_OTHER): Payer: Self-pay | Admitting: Internal Medicine

## 2021-05-08 DIAGNOSIS — Z1231 Encounter for screening mammogram for malignant neoplasm of breast: Secondary | ICD-10-CM | POA: Diagnosis not present

## 2021-05-08 MED ORDER — HYOSCYAMINE SULFATE 0.125 MG SL SUBL: 0.1250 mg | SUBLINGUAL_TABLET | Freq: Three times a day (TID) | SUBLINGUAL | 1 refills | Status: DC | PRN

## 2021-05-08 NOTE — Telephone Encounter (Signed)
Late entry - I advised patient to call pcp yesterday ( 05/07/21) since Dr. Laural Golden was out of office. I called patient this morning to get update and left a message to return call

## 2021-05-08 NOTE — Telephone Encounter (Signed)
Per dr Laural Golden - pt needs ov 05/13/21. May take imodium 2mg  today and then 2mg  bid on schedule. Also sent in levsin to take up to tid prn pain. Stool test are negative /normal. If not better by Saturday call aph and have dr Laural Golden paged. I gave pt the number to [place] operator to call to have him paged if she is not better by Saturday. Dr Laural Golden wanted to know if pt had started any new meds and pt states she has not. Pt aware we will have to call her back about appt on Tuesday since dr Laural Golden schedule is full.

## 2021-05-08 NOTE — Telephone Encounter (Signed)
Pt states she was not able to get in touch with pcp. States she is not having diarrhea this morning. Feeling about the same. States she has 2-3 episodes a day and will skip every other day.

## 2021-05-09 NOTE — Telephone Encounter (Signed)
Pt called and given appt for 05/13/21 at 8:45

## 2021-05-13 ENCOUNTER — Ambulatory Visit (INDEPENDENT_AMBULATORY_CARE_PROVIDER_SITE_OTHER): Payer: Medicare Other | Admitting: Internal Medicine

## 2021-05-13 ENCOUNTER — Encounter (INDEPENDENT_AMBULATORY_CARE_PROVIDER_SITE_OTHER): Payer: Self-pay | Admitting: Internal Medicine

## 2021-05-13 ENCOUNTER — Other Ambulatory Visit: Payer: Self-pay

## 2021-05-13 DIAGNOSIS — K3184 Gastroparesis: Secondary | ICD-10-CM

## 2021-05-13 DIAGNOSIS — K317 Polyp of stomach and duodenum: Secondary | ICD-10-CM | POA: Diagnosis not present

## 2021-05-13 DIAGNOSIS — K219 Gastro-esophageal reflux disease without esophagitis: Secondary | ICD-10-CM | POA: Diagnosis not present

## 2021-05-13 DIAGNOSIS — K582 Mixed irritable bowel syndrome: Secondary | ICD-10-CM | POA: Diagnosis not present

## 2021-05-13 DIAGNOSIS — E1143 Type 2 diabetes mellitus with diabetic autonomic (poly)neuropathy: Secondary | ICD-10-CM

## 2021-05-13 DIAGNOSIS — K589 Irritable bowel syndrome without diarrhea: Secondary | ICD-10-CM | POA: Insufficient documentation

## 2021-05-13 MED ORDER — BISACODYL 10 MG RE SUPP: 10.0000 mg | RECTAL | 0 refills | Status: DC | PRN

## 2021-05-13 NOTE — Progress Notes (Signed)
Presenting complaint;  Follow for diarrhea.  Database and subjective:  Patient is 76 year old Caucasian female who has a history of hyperplastic gastric polyps and gastroparesis who was last seen by Ms. Chelsea Carlan on 05/01/2021 for diarrhea and she was noted to be tender across lower abdomen on examination.  Abdominal pelvic CT was negative for diverticulitis.  Stool studies were subsequently obtained.  Both GI pathogen panel and C. difficile testing for antigen and toxin were negative.  She is advised to be on low residue diet until diarrhea improves.  Patient states stool frequency has improved but she remains with at least 3 bowel movements per day.  Most of her stools occur after meals and sometimes spontaneously.  However she has not had a bowel movement in 4 days.  She has been using Imodium and Pepto-Bismol.  She denies melena or rectal bleeding.  She states she has not traveled recently or use antibiotics.  She drinks bottled water.  She says she has had bowel issues for years.  She would have diarrhea episodes followed by constipation and normal stools but her diarrhea was never this bad. Patient states she stopped her pantoprazole 2 weeks ago hoping to be tested for H. pylori.  She is watching her diet.  She was diagnosed with gastroparesis last year when she was found to have food debris in her stomach.  Rest of the polyps were not removed.  Subsequent gastric emptying study confirmed this diagnosis.  She took domperidone which helped but she developed swelling involving her feet and therefore stopped this medication.  She is still having burps intermittently and at times but these are not foul-smelling anymore. Her appetite is good and her weight has been stable.  Current Medications: Outpatient Encounter Medications as of 05/13/2021  Medication Sig   acetaminophen (TYLENOL) 500 MG tablet Take 1,000 mg by mouth every 6 (six) hours as needed for mild pain.   amLODipine (NORVASC) 10 MG  tablet Take 10 mg by mouth daily.   Ascorbic Acid (VITAMIN C) 1000 MG tablet Take 1,000 mg by mouth daily.   aspirin EC 81 MG tablet Take 1 tablet (81 mg total) by mouth at bedtime.   atenolol (TENORMIN) 50 MG tablet Take 50 mg by mouth daily.   gabapentin (NEURONTIN) 300 MG capsule Take 300 mg by mouth 2 (two) times daily.   hyoscyamine (LEVSIN SL) 0.125 MG SL tablet Place 1 tablet (0.125 mg total) under the tongue 3 (three) times daily as needed.   insulin degludec (TRESIBA) 100 UNIT/ML FlexTouch Pen Inject 76 Units into the skin daily.   lisinopril (PRINIVIL,ZESTRIL) 40 MG tablet Take 40 mg by mouth daily.   Multiple Vitamins-Minerals (MULTIVITAMIN ADULTS PO) Take 1 tablet by mouth daily.    ONE TOUCH ULTRA TEST test strip USE ONE STRIP TO CHECK GLUCOSE 4 TIMES DAILY   Probiotic Product (PHILLIPS COLON HEALTH PO) Take 1 capsule by mouth daily.   rosuvastatin (CRESTOR) 5 MG tablet Take 5 mg by mouth daily.   Semaglutide,0.25 or 0.5MG /DOS, (OZEMPIC, 0.25 OR 0.5 MG/DOSE,) 2 MG/1.5ML SOPN Inject 0.5 mg into the skin once a week.    venlafaxine XR (EFFEXOR-XR) 75 MG 24 hr capsule Take 75 mg by mouth daily with breakfast.   pantoprazole (PROTONIX) 40 MG tablet Take 40 mg by mouth 2 (two) times daily.    No facility-administered encounter medications on file as of 05/13/2021.     Objective: Blood pressure (!) 166/73, pulse 67, temperature 98.5 F (36.9 C), temperature source  Oral, height 5\' 4"  (1.626 m), weight 172 lb 1.6 oz (78.1 kg). Patient is alert and in no acute distress. Conjunctiva is pink. Sclera is nonicteric Oropharyngeal mucosa is normal. No neck masses or thyromegaly noted. Cardiac exam with regular rhythm normal S1 and S2. No murmur or gallop noted. Lungs are clear to auscultation. Abdomen is symmetrical.  Bowel sounds are normal.  On palpation abdomen is soft and nontender with organomegaly or masses. No LE edema or clubbing noted.  Labs/studies Results:  GI pathogen panel  negative on 05/06/2021 C. difficile antigen and toxin negative on 05/06/2021. No recent lab data on file.   Assessment:  #1.  Recent bout of diarrhea most likely postinfectious.  Stool frequency is improved and she has become constipated.  I suspect she has underlying irritable bowel syndrome given history of irregular bowel habits with diarrhea as well as constipation.  #2.  Irritable bowel syndrome as above.  #3.  Diabetic gastroparesis.  She can use domperidone on a as needed basis and hopefully will not develop pedal edema.  #4.  History of gastric polyps.  She had 4 polyps removed including over 30 mm polyp with ulceration.  All of these polyps are hyperplastic.  She still has few left.  Will consider follow-up EGD sometime this year.  #5.  H. pylori stool antigen was negative in September 2022.  Therefore she does not need any follow-up testing.  #6.  GERD symptoms well controlled with dietary measures and PPI.   Plan:  Patient given information printed information regarding gastroparesis diet. Patient advised to resume pantoprazole at 40 mg p.o. every morning. She can use domperidone 5 mg twice daily as needed only. Patient advised to use Dulcolax or glycerin suppository every day for 1 week and thereafter on as-needed basis.  She needs to make sure she has bowel movement of at least every other day. She will keep stool diary as to frequency and consistency until office visit in 2 months.

## 2021-05-13 NOTE — Patient Instructions (Addendum)
Gastroparesis diet/sample provided to patient. Use Dulcolax or glycerin suppository daily for 1 week and thereafter on as-needed basis. Use domperidone on an as-needed basis. Keep stool diary as to frequency and consistency of stools for 2 months until next office visit. Also keep a written record on days when uses suppository. Resume pantoprazole but try taking 1 daily instead of 2.  Take it 30 minutes before breakfast.  If Does not work he can go back to taking it twice a day.  Will change prescription at next office visit.

## 2021-05-16 ENCOUNTER — Encounter (INDEPENDENT_AMBULATORY_CARE_PROVIDER_SITE_OTHER): Payer: Self-pay

## 2021-05-23 DIAGNOSIS — N183 Chronic kidney disease, stage 3 unspecified: Secondary | ICD-10-CM | POA: Diagnosis not present

## 2021-05-23 DIAGNOSIS — E782 Mixed hyperlipidemia: Secondary | ICD-10-CM | POA: Diagnosis not present

## 2021-05-23 DIAGNOSIS — K2100 Gastro-esophageal reflux disease with esophagitis, without bleeding: Secondary | ICD-10-CM | POA: Diagnosis not present

## 2021-05-23 DIAGNOSIS — E7849 Other hyperlipidemia: Secondary | ICD-10-CM | POA: Diagnosis not present

## 2021-05-23 DIAGNOSIS — E1165 Type 2 diabetes mellitus with hyperglycemia: Secondary | ICD-10-CM | POA: Diagnosis not present

## 2021-05-23 DIAGNOSIS — E1122 Type 2 diabetes mellitus with diabetic chronic kidney disease: Secondary | ICD-10-CM | POA: Diagnosis not present

## 2021-05-28 DIAGNOSIS — E7849 Other hyperlipidemia: Secondary | ICD-10-CM | POA: Diagnosis not present

## 2021-05-28 DIAGNOSIS — F331 Major depressive disorder, recurrent, moderate: Secondary | ICD-10-CM | POA: Diagnosis not present

## 2021-05-28 DIAGNOSIS — D500 Iron deficiency anemia secondary to blood loss (chronic): Secondary | ICD-10-CM | POA: Diagnosis not present

## 2021-05-28 DIAGNOSIS — E1122 Type 2 diabetes mellitus with diabetic chronic kidney disease: Secondary | ICD-10-CM | POA: Diagnosis not present

## 2021-05-28 DIAGNOSIS — Z23 Encounter for immunization: Secondary | ICD-10-CM | POA: Diagnosis not present

## 2021-05-28 DIAGNOSIS — E1143 Type 2 diabetes mellitus with diabetic autonomic (poly)neuropathy: Secondary | ICD-10-CM | POA: Diagnosis not present

## 2021-05-28 DIAGNOSIS — I10 Essential (primary) hypertension: Secondary | ICD-10-CM | POA: Diagnosis not present

## 2021-05-28 DIAGNOSIS — Z6828 Body mass index (BMI) 28.0-28.9, adult: Secondary | ICD-10-CM | POA: Diagnosis not present

## 2021-06-28 DIAGNOSIS — M79671 Pain in right foot: Secondary | ICD-10-CM | POA: Diagnosis not present

## 2021-06-28 DIAGNOSIS — S93601A Unspecified sprain of right foot, initial encounter: Secondary | ICD-10-CM | POA: Diagnosis not present

## 2021-07-02 DIAGNOSIS — H353131 Nonexudative age-related macular degeneration, bilateral, early dry stage: Secondary | ICD-10-CM | POA: Diagnosis not present

## 2021-08-08 DIAGNOSIS — I129 Hypertensive chronic kidney disease with stage 1 through stage 4 chronic kidney disease, or unspecified chronic kidney disease: Secondary | ICD-10-CM | POA: Diagnosis not present

## 2021-08-08 DIAGNOSIS — E1122 Type 2 diabetes mellitus with diabetic chronic kidney disease: Secondary | ICD-10-CM | POA: Diagnosis not present

## 2021-09-11 ENCOUNTER — Other Ambulatory Visit (HOSPITAL_COMMUNITY): Payer: Self-pay | Admitting: Urology

## 2021-09-11 ENCOUNTER — Ambulatory Visit (HOSPITAL_COMMUNITY)
Admission: RE | Admit: 2021-09-11 | Discharge: 2021-09-11 | Disposition: A | Discharge status: Home / Self Care | Payer: Medicare Other | Source: Ambulatory Visit | Attending: Urology | Admitting: Urology

## 2021-09-11 DIAGNOSIS — N2889 Other specified disorders of kidney and ureter: Secondary | ICD-10-CM | POA: Diagnosis not present

## 2021-09-11 DIAGNOSIS — D4101 Neoplasm of uncertain behavior of right kidney: Secondary | ICD-10-CM | POA: Diagnosis not present

## 2021-09-11 DIAGNOSIS — I700 Atherosclerosis of aorta: Secondary | ICD-10-CM | POA: Diagnosis not present

## 2021-09-11 DIAGNOSIS — C642 Malignant neoplasm of left kidney, except renal pelvis: Secondary | ICD-10-CM

## 2021-09-11 DIAGNOSIS — Z85528 Personal history of other malignant neoplasm of kidney: Secondary | ICD-10-CM | POA: Diagnosis not present

## 2021-09-11 DIAGNOSIS — Z9889 Other specified postprocedural states: Secondary | ICD-10-CM | POA: Diagnosis not present

## 2021-09-15 DIAGNOSIS — D4101 Neoplasm of uncertain behavior of right kidney: Secondary | ICD-10-CM | POA: Diagnosis not present

## 2021-09-15 DIAGNOSIS — C642 Malignant neoplasm of left kidney, except renal pelvis: Secondary | ICD-10-CM | POA: Diagnosis not present

## 2021-09-17 DIAGNOSIS — E782 Mixed hyperlipidemia: Secondary | ICD-10-CM | POA: Diagnosis not present

## 2021-09-17 DIAGNOSIS — K2100 Gastro-esophageal reflux disease with esophagitis, without bleeding: Secondary | ICD-10-CM | POA: Diagnosis not present

## 2021-09-17 DIAGNOSIS — Z6828 Body mass index (BMI) 28.0-28.9, adult: Secondary | ICD-10-CM | POA: Diagnosis not present

## 2021-09-17 DIAGNOSIS — E1122 Type 2 diabetes mellitus with diabetic chronic kidney disease: Secondary | ICD-10-CM | POA: Diagnosis not present

## 2021-09-17 DIAGNOSIS — D500 Iron deficiency anemia secondary to blood loss (chronic): Secondary | ICD-10-CM | POA: Diagnosis not present

## 2021-09-17 DIAGNOSIS — Z9189 Other specified personal risk factors, not elsewhere classified: Secondary | ICD-10-CM | POA: Diagnosis not present

## 2021-09-17 DIAGNOSIS — E1143 Type 2 diabetes mellitus with diabetic autonomic (poly)neuropathy: Secondary | ICD-10-CM | POA: Diagnosis not present

## 2021-09-17 DIAGNOSIS — F331 Major depressive disorder, recurrent, moderate: Secondary | ICD-10-CM | POA: Diagnosis not present

## 2021-09-17 DIAGNOSIS — K3184 Gastroparesis: Secondary | ICD-10-CM | POA: Diagnosis not present

## 2021-09-17 DIAGNOSIS — I10 Essential (primary) hypertension: Secondary | ICD-10-CM | POA: Diagnosis not present

## 2021-09-17 DIAGNOSIS — R5383 Other fatigue: Secondary | ICD-10-CM | POA: Diagnosis not present

## 2021-09-17 DIAGNOSIS — Z1212 Encounter for screening for malignant neoplasm of rectum: Secondary | ICD-10-CM | POA: Diagnosis not present

## 2021-09-17 DIAGNOSIS — N1831 Chronic kidney disease, stage 3a: Secondary | ICD-10-CM | POA: Diagnosis not present

## 2021-09-17 DIAGNOSIS — E7849 Other hyperlipidemia: Secondary | ICD-10-CM | POA: Diagnosis not present

## 2021-09-26 DIAGNOSIS — R748 Abnormal levels of other serum enzymes: Secondary | ICD-10-CM | POA: Diagnosis not present

## 2021-09-26 DIAGNOSIS — Z85528 Personal history of other malignant neoplasm of kidney: Secondary | ICD-10-CM | POA: Diagnosis not present

## 2021-10-01 DIAGNOSIS — I10 Essential (primary) hypertension: Secondary | ICD-10-CM | POA: Diagnosis not present

## 2021-10-01 DIAGNOSIS — K3184 Gastroparesis: Secondary | ICD-10-CM | POA: Diagnosis not present

## 2021-10-01 DIAGNOSIS — D500 Iron deficiency anemia secondary to blood loss (chronic): Secondary | ICD-10-CM | POA: Diagnosis not present

## 2021-10-01 DIAGNOSIS — R5383 Other fatigue: Secondary | ICD-10-CM | POA: Diagnosis not present

## 2021-10-01 DIAGNOSIS — E7849 Other hyperlipidemia: Secondary | ICD-10-CM | POA: Diagnosis not present

## 2021-10-01 DIAGNOSIS — E1143 Type 2 diabetes mellitus with diabetic autonomic (poly)neuropathy: Secondary | ICD-10-CM | POA: Diagnosis not present

## 2021-10-01 DIAGNOSIS — E1122 Type 2 diabetes mellitus with diabetic chronic kidney disease: Secondary | ICD-10-CM | POA: Diagnosis not present

## 2021-10-01 DIAGNOSIS — F331 Major depressive disorder, recurrent, moderate: Secondary | ICD-10-CM | POA: Diagnosis not present

## 2021-10-08 DIAGNOSIS — Z6828 Body mass index (BMI) 28.0-28.9, adult: Secondary | ICD-10-CM | POA: Diagnosis not present

## 2021-10-08 DIAGNOSIS — K529 Noninfective gastroenteritis and colitis, unspecified: Secondary | ICD-10-CM | POA: Diagnosis not present

## 2021-10-09 ENCOUNTER — Ambulatory Visit (INDEPENDENT_AMBULATORY_CARE_PROVIDER_SITE_OTHER): Payer: Medicare Other | Admitting: Gastroenterology

## 2021-11-07 DIAGNOSIS — N184 Chronic kidney disease, stage 4 (severe): Secondary | ICD-10-CM | POA: Diagnosis not present

## 2021-11-07 DIAGNOSIS — E1122 Type 2 diabetes mellitus with diabetic chronic kidney disease: Secondary | ICD-10-CM | POA: Diagnosis not present

## 2021-11-07 DIAGNOSIS — K219 Gastro-esophageal reflux disease without esophagitis: Secondary | ICD-10-CM | POA: Diagnosis not present

## 2021-11-08 DIAGNOSIS — R197 Diarrhea, unspecified: Secondary | ICD-10-CM | POA: Diagnosis not present

## 2021-11-08 DIAGNOSIS — E1343 Other specified diabetes mellitus with diabetic autonomic (poly)neuropathy: Secondary | ICD-10-CM | POA: Diagnosis not present

## 2021-11-17 DIAGNOSIS — R140 Abdominal distension (gaseous): Secondary | ICD-10-CM | POA: Diagnosis not present

## 2021-11-17 DIAGNOSIS — Z1329 Encounter for screening for other suspected endocrine disorder: Secondary | ICD-10-CM | POA: Diagnosis not present

## 2021-11-17 DIAGNOSIS — E559 Vitamin D deficiency, unspecified: Secondary | ICD-10-CM | POA: Diagnosis not present

## 2021-11-17 DIAGNOSIS — R5383 Other fatigue: Secondary | ICD-10-CM | POA: Diagnosis not present

## 2021-11-17 DIAGNOSIS — R1031 Right lower quadrant pain: Secondary | ICD-10-CM | POA: Diagnosis not present

## 2021-11-17 DIAGNOSIS — R197 Diarrhea, unspecified: Secondary | ICD-10-CM | POA: Diagnosis not present

## 2021-12-29 ENCOUNTER — Telehealth: Payer: Self-pay | Admitting: Gastroenterology

## 2021-12-29 NOTE — Telephone Encounter (Signed)
Hi Dr. Loletha Carrow,   Patient called requesting a transfer of care over to you. She is a current patient at [facility] stated Dr. Laural Golden retired. All of her records are available via Epic for you to review and advise on scheduling.   Thanks

## 2021-12-30 NOTE — Telephone Encounter (Signed)
She can have a next-available new patient appointment with me.  - HD

## 2022-01-01 ENCOUNTER — Encounter: Payer: Self-pay | Admitting: Gastroenterology

## 2022-01-15 DIAGNOSIS — E1165 Type 2 diabetes mellitus with hyperglycemia: Secondary | ICD-10-CM | POA: Diagnosis not present

## 2022-01-15 DIAGNOSIS — N183 Chronic kidney disease, stage 3 unspecified: Secondary | ICD-10-CM | POA: Diagnosis not present

## 2022-01-15 DIAGNOSIS — D649 Anemia, unspecified: Secondary | ICD-10-CM | POA: Diagnosis not present

## 2022-01-15 DIAGNOSIS — E7849 Other hyperlipidemia: Secondary | ICD-10-CM | POA: Diagnosis not present

## 2022-01-15 DIAGNOSIS — E782 Mixed hyperlipidemia: Secondary | ICD-10-CM | POA: Diagnosis not present

## 2022-01-15 DIAGNOSIS — E1122 Type 2 diabetes mellitus with diabetic chronic kidney disease: Secondary | ICD-10-CM | POA: Diagnosis not present

## 2022-01-15 DIAGNOSIS — D529 Folate deficiency anemia, unspecified: Secondary | ICD-10-CM | POA: Diagnosis not present

## 2022-01-15 DIAGNOSIS — E871 Hypo-osmolality and hyponatremia: Secondary | ICD-10-CM | POA: Diagnosis not present

## 2022-01-15 DIAGNOSIS — D519 Vitamin B12 deficiency anemia, unspecified: Secondary | ICD-10-CM | POA: Diagnosis not present

## 2022-01-19 DIAGNOSIS — E1122 Type 2 diabetes mellitus with diabetic chronic kidney disease: Secondary | ICD-10-CM | POA: Diagnosis not present

## 2022-01-19 DIAGNOSIS — D500 Iron deficiency anemia secondary to blood loss (chronic): Secondary | ICD-10-CM | POA: Diagnosis not present

## 2022-01-19 DIAGNOSIS — R5383 Other fatigue: Secondary | ICD-10-CM | POA: Diagnosis not present

## 2022-01-19 DIAGNOSIS — K2100 Gastro-esophageal reflux disease with esophagitis, without bleeding: Secondary | ICD-10-CM | POA: Diagnosis not present

## 2022-01-19 DIAGNOSIS — Z91018 Allergy to other foods: Secondary | ICD-10-CM | POA: Diagnosis not present

## 2022-01-19 DIAGNOSIS — E1143 Type 2 diabetes mellitus with diabetic autonomic (poly)neuropathy: Secondary | ICD-10-CM | POA: Diagnosis not present

## 2022-01-19 DIAGNOSIS — E7849 Other hyperlipidemia: Secondary | ICD-10-CM | POA: Diagnosis not present

## 2022-01-19 DIAGNOSIS — K3184 Gastroparesis: Secondary | ICD-10-CM | POA: Diagnosis not present

## 2022-01-19 DIAGNOSIS — I10 Essential (primary) hypertension: Secondary | ICD-10-CM | POA: Diagnosis not present

## 2022-01-19 DIAGNOSIS — N1831 Chronic kidney disease, stage 3a: Secondary | ICD-10-CM | POA: Diagnosis not present

## 2022-01-19 DIAGNOSIS — Z23 Encounter for immunization: Secondary | ICD-10-CM | POA: Diagnosis not present

## 2022-01-19 DIAGNOSIS — F331 Major depressive disorder, recurrent, moderate: Secondary | ICD-10-CM | POA: Diagnosis not present

## 2022-01-28 DIAGNOSIS — E876 Hypokalemia: Secondary | ICD-10-CM | POA: Diagnosis not present

## 2022-01-28 DIAGNOSIS — N184 Chronic kidney disease, stage 4 (severe): Secondary | ICD-10-CM | POA: Diagnosis not present

## 2022-01-28 DIAGNOSIS — I10 Essential (primary) hypertension: Secondary | ICD-10-CM | POA: Diagnosis not present

## 2022-01-28 DIAGNOSIS — E7849 Other hyperlipidemia: Secondary | ICD-10-CM | POA: Diagnosis not present

## 2022-01-28 DIAGNOSIS — E1165 Type 2 diabetes mellitus with hyperglycemia: Secondary | ICD-10-CM | POA: Diagnosis not present

## 2022-01-28 DIAGNOSIS — E782 Mixed hyperlipidemia: Secondary | ICD-10-CM | POA: Diagnosis not present

## 2022-01-28 DIAGNOSIS — M545 Low back pain, unspecified: Secondary | ICD-10-CM | POA: Diagnosis not present

## 2022-01-28 DIAGNOSIS — R5383 Other fatigue: Secondary | ICD-10-CM | POA: Diagnosis not present

## 2022-01-28 DIAGNOSIS — K2100 Gastro-esophageal reflux disease with esophagitis, without bleeding: Secondary | ICD-10-CM | POA: Diagnosis not present

## 2022-02-05 DIAGNOSIS — E213 Hyperparathyroidism, unspecified: Secondary | ICD-10-CM | POA: Diagnosis not present

## 2022-02-05 NOTE — Progress Notes (Signed)
ERROR

## 2022-02-05 NOTE — Progress Notes (Signed)
,  CC No chief complaint on file.

## 2022-02-14 DIAGNOSIS — R112 Nausea with vomiting, unspecified: Secondary | ICD-10-CM | POA: Diagnosis not present

## 2022-02-14 DIAGNOSIS — K529 Noninfective gastroenteritis and colitis, unspecified: Secondary | ICD-10-CM | POA: Diagnosis not present

## 2022-02-14 DIAGNOSIS — E119 Type 2 diabetes mellitus without complications: Secondary | ICD-10-CM | POA: Diagnosis not present

## 2022-02-14 DIAGNOSIS — Z8673 Personal history of transient ischemic attack (TIA), and cerebral infarction without residual deficits: Secondary | ICD-10-CM | POA: Diagnosis not present

## 2022-02-14 DIAGNOSIS — I10 Essential (primary) hypertension: Secondary | ICD-10-CM | POA: Diagnosis not present

## 2022-02-14 DIAGNOSIS — Z905 Acquired absence of kidney: Secondary | ICD-10-CM | POA: Diagnosis not present

## 2022-02-18 ENCOUNTER — Other Ambulatory Visit (HOSPITAL_COMMUNITY): Payer: Self-pay

## 2022-02-18 ENCOUNTER — Other Ambulatory Visit: Payer: Medicare Other

## 2022-02-18 ENCOUNTER — Telehealth: Payer: Self-pay

## 2022-02-18 ENCOUNTER — Encounter: Payer: Self-pay | Admitting: Gastroenterology

## 2022-02-18 ENCOUNTER — Ambulatory Visit (INDEPENDENT_AMBULATORY_CARE_PROVIDER_SITE_OTHER): Payer: Medicare Other | Admitting: Gastroenterology

## 2022-02-18 VITALS — BP 130/72 | HR 64 | Ht 64.0 in | Wt 171.0 lb

## 2022-02-18 DIAGNOSIS — R152 Fecal urgency: Secondary | ICD-10-CM

## 2022-02-18 DIAGNOSIS — K529 Noninfective gastroenteritis and colitis, unspecified: Secondary | ICD-10-CM

## 2022-02-18 DIAGNOSIS — R159 Full incontinence of feces: Secondary | ICD-10-CM

## 2022-02-18 DIAGNOSIS — K3184 Gastroparesis: Secondary | ICD-10-CM | POA: Diagnosis not present

## 2022-02-18 DIAGNOSIS — R140 Abdominal distension (gaseous): Secondary | ICD-10-CM | POA: Diagnosis not present

## 2022-02-18 MED ORDER — NA SULFATE-K SULFATE-MG SULF 17.5-3.13-1.6 GM/177ML PO SOLN: 1.0000 | Freq: Once | ORAL | 0 refills | Status: AC

## 2022-02-18 NOTE — Progress Notes (Signed)
[facility] Consult Note:  History: Angela Harris 02/18/2022  Referring provider: Caryl Bis, MD  Reason for consult/chief complaint: Irritable Bowel Syndrome (With diarrhea, not able to control her diarrhea. Has been told that she has Gastroparesis based off of Gastric Emptying Study. Patient states that she is also having some issues with her thyroid glands. She is seeing specialist in Nov. Patient did take a round of Xifaxan and it  did help her.  )   Subjective  HPI: This is a 76 year old woman transferring care to our practice after recent retirement of Dr. Laural Golden in [place].  She has been a patient in our practice since 2020 and has multiple digestive issues.  A clinic note from that GI practice on 05/01/2021 was reviewed.  At that time she was seen for about 10 days of diarrhea suspected to have likely been infectious.  CT scan with no findings, stool negative for C. difficile and full GI pathogen panel. She reportedly has a history of gastroparesis that improved on domperidone but she stopped it because it caused peripheral edema. She saw Dr. Laural Golden one 323 for lower abdominal pain and diarrhea, however at the time of that visit she did not have a BM in several days her bowel habits were reportedly alternate.Angela Harris recommended as needed use of the domperidone.  Also noted to have multiple hyperplastic gastric polyps, including a large one with ulceration and plans for follow-up endoscopy. Negative H. pylori stool antigen September 2022.  Gastroparesis diet recommended.  Pantoprazole 40 mg every morning.  She was in the [facility] ED 4 days ago for several days of abdominal cramping nausea vomiting and diarrhea.  Clinical impression by provider was infectious gastroenteritis. _________  Chong Angela Harris was here with her daughter and described her struggles with digestive symptoms.  She does have early satiety bloating and belching and has mostly learned  to control her gastroparesis with diet.  She rarely if ever takes the domperidone because of the swelling side effect it caused. Her most bothersome thing is diarrhea.  She has crampy lower abdominal pain urgency and loose stool that is at times so bad she becomes incontinent.  She has had episodes of diarrhea overnight and has to travel using depends undergarments.  The symptoms have occurred for years but it seems worse within the last year.  Her PCP recently gave her a 10-day sample course of rifaximin twice daily, and she reports feeling considerably improved during that treatment course.  However, symptoms returned the same soon afterward, and she received another 4-day course (that was all the samples they had). She denies rectal bleeding.  Lastly, she has a thyroid condition going on right now and was uncertain of the details other than she had a type of scan and needs to see a surgeon soon for possible parathyroid removal.  (No records in the way of notes or labs from primary care were available today to know her TSH/free T4.)  ROS:  Review of Systems  Constitutional:  Negative for appetite change and unexpected weight change.  HENT:  Negative for mouth sores and voice change.   Eyes:  Negative for pain and redness.  Respiratory:  Negative for cough and shortness of breath.   Cardiovascular:  Negative for chest pain and palpitations.  Genitourinary:  Negative for dysuria and hematuria.  Musculoskeletal:  Negative for arthralgias and myalgias.  Skin:  Negative for pallor and rash.  Neurological:  Positive for headaches. Negative for weakness.  Hematological:  Negative for adenopathy.     Past Medical History: Past Medical History:  Diagnosis Date   Anemia    CKD (chronic kidney disease) stage 3, GFR 30-59 ml/min (HCC)    Depression    Dysrhythmia    LBBB   Essential hypertension    GERD (gastroesophageal reflux disease)    History of TIA (transient ischemic attack) 2015    Hyperlipidemia    Meniere disease    pt denies   Peripheral neuropathy    PONV (postoperative nausea and vomiting)    Stroke ([place]) [address]2409   "mini-stroke". No deficits from this   Type 2 diabetes mellitus ([place])    She recalls being diagnosed with diabetes about 10 years ago.  Past Surgical History: Past Surgical History:  Procedure Laterality Date   ABDOMINAL HYSTERECTOMY     BACK SURGERY     ruptured disc lower back   BIOPSY  11/02/2019   Procedure: BIOPSY;  Surgeon: Rogene Houston, MD;  [facility];  Service: Endoscopy;;  gastric   CARPAL TUNNEL RELEASE Right 08/20/2016   Procedure: CARPAL TUNNEL RELEASE;  Surgeon: Carole Civil, MD;  Location: [facility];  Service: Orthopedics;  Laterality: Right;   CATARACT EXTRACTION, BILATERAL     CESAREAN SECTION     CHOLECYSTECTOMY     Closed reduction right wrist fracture Right    COLONOSCOPY  05/29/2016   Dr Anthony Sar, Normal   ECTOPIC PREGNANCY SURGERY     ESOPHAGEAL DILATION N/A 11/02/2019   Procedure: ESOPHAGEAL DILATION;  Surgeon: Rogene Houston, MD;  [facility]: [facility];  Service: Endoscopy;  Laterality: N/A;   ESOPHAGOGASTRODUODENOSCOPY N/A 11/02/2019   Procedure: ESOPHAGOGASTRODUODENOSCOPY (EGD);  Surgeon: Rogene Houston, MD;  [facility];  Service: Endoscopy;  Laterality: N/A;  125   ESOPHAGOGASTRODUODENOSCOPY (EGD) WITH PROPOFOL N/A 12/13/2019   Procedure: ESOPHAGOGASTRODUODENOSCOPY (EGD) WITH PROPOFOL;  Surgeon: Rogene Houston, MD;  [facility];  Service: Endoscopy;  Laterality: N/A;  1255   ESOPHAGOGASTRODUODENOSCOPY (EGD) WITH PROPOFOL N/A 05/01/2020   Angela Harris: normal esophagus, z line regular, 2cm hh, large amount of food residue in stomach, three gastric polyps, not removed, normal pylorus and duodenal bulb, normal second portion of duodenum, no specimens   ORIF WRIST FRACTURE Right 02/26/2016   Procedure: OPEN REDUCTION INTERNAL FIXATION (ORIF) WRIST FRACTURE;  Surgeon: Carole Civil, MD;  Location: [facility];  Service: Orthopedics;  Laterality: Right;  TIME 12:30   POLYPECTOMY N/A 12/13/2019   Procedure: POLYPECTOMY;  Surgeon: Rogene Houston, MD;  [facility];  Service: Endoscopy;  Laterality: N/A;   ROBOTIC ASSITED PARTIAL NEPHRECTOMY Left 06/18/2017   Procedure: XI ROBOTIC ASSITED PARTIAL NEPHRECTOMY;  Surgeon: Alexis Frock, MD;  Location: [facility];  Service: Urology;  Laterality: Left;   TONSILLECTOMY     2018 colonoscopy [place] - no polyps, no Bx  Jan 2021 colonoscopy [place] - multiple polyps, unknown path    Family History: Family History  Problem Relation Age of Onset   Alzheimer's disease Mother    Thyroid disease Mother    Hypertension Mother    Alcoholism Father    Hypertension Father    Alcoholism Sister    Hypertension Sister    Alcoholism Brother    Hypertension Brother     Social History: Social History   Socioeconomic History   Marital status: Divorced    Spouse name: Not on file   Number of children: Not on file   Years of  education: Not on file   Highest education level: Not on file  Occupational History   Occupation: retired  Tobacco Use   Smoking status: Never   Smokeless tobacco: Never  Vaping Use   Vaping Use: Never used  Substance and Sexual Activity   Alcohol use: No    Alcohol/week: 0.0 standard drinks of alcohol   Drug use: No   Sexual activity: Never    Birth control/protection: Surgical  Other Topics Concern   Not on file  Social History Narrative   Not on file   Social Determinants of Health   Financial Resource Strain: Not on file  Food Insecurity: Not on file  Transportation Needs: Not on file  Physical Activity: Not on file  Stress: Not on file  Social Connections: Not on file    Allergies: No Known Allergies  Outpatient Meds: Current Outpatient Medications  Medication Sig Dispense Refill   acetaminophen (TYLENOL) 500 MG tablet Take 1,000 mg by mouth every 6 (six) hours as  needed for mild pain.     amLODipine (NORVASC) 10 MG tablet Take 10 mg by mouth daily.     Ascorbic Acid (VITAMIN C) 1000 MG tablet Take 1,000 mg by mouth daily.     aspirin EC 81 MG tablet Take 1 tablet (81 mg total) by mouth at bedtime. 30 tablet 11   atenolol (TENORMIN) 50 MG tablet Take 50 mg by mouth daily.     gabapentin (NEURONTIN) 300 MG capsule Take 300 mg by mouth 2 (two) times daily.     insulin degludec (TRESIBA) 100 UNIT/ML FlexTouch Pen Inject 76 Units into the skin daily.     lisinopril (PRINIVIL,ZESTRIL) 40 MG tablet Take 40 mg by mouth daily.     Multiple Vitamins-Minerals (MULTIVITAMIN ADULTS PO) Take 1 tablet by mouth daily.      ondansetron (ZOFRAN-ODT) 4 MG disintegrating tablet Take 4 mg by mouth every 8 (eight) hours as needed for nausea or vomiting.     pantoprazole (PROTONIX) 40 MG tablet Take 40 mg by mouth daily.     PRESCRIPTION MEDICATION Domperidone '5mg'$  one bid prn     Probiotic Product (PHILLIPS COLON HEALTH PO) Take 1 capsule by mouth daily.     rosuvastatin (CRESTOR) 5 MG tablet Take 5 mg by mouth daily.     Semaglutide,0.25 or 0.'5MG'$ /DOS, (OZEMPIC, 0.25 OR 0.5 MG/DOSE,) 2 MG/1.5ML SOPN Inject 0.5 mg into the skin once a week.      venlafaxine XR (EFFEXOR-XR) 75 MG 24 hr capsule Take 75 mg by mouth daily with breakfast.     ONE TOUCH ULTRA TEST test strip USE ONE STRIP TO CHECK GLUCOSE 4 TIMES DAILY 150 each 5   No current facility-administered medications for this visit.      ___________________________________________________________________ Objective   Exam:  BP 130/72   Pulse 64   Ht '5\' 4"'$  (1.626 m)   Wt 171 lb (77.6 kg)   SpO2 97%   BMI 29.35 kg/m  Wt Readings from Last 3 Encounters:  02/18/22 171 lb (77.6 kg)  05/13/21 172 lb 1.6 oz (78.1 kg)  05/01/21 170 lb 6.4 oz (77.3 kg)   Her daughter was present for the entire visit  General: Well-appearing, no muscle wasting, looks well-hydrated Eyes: sclera anicteric, no redness ENT: oral  mucosa moist without lesions, no cervical or supraclavicular lymphadenopathy CV: Regular, no JVD, no peripheral edema Resp: clear to auscultation bilaterally, normal RR and effort noted GI: soft, no tenderness, with hyperactive bowel sounds. No guarding  or palpable organomegaly noted.  No distention Skin; warm and dry, no rash or jaundice noted Neuro: awake, alert and oriented x 3. Normal gross motor function and fluent speech  Labs:  Recent ED labs  CMP notable for potassium 3.4, creatinine 1.2, BUN 16, glucose 194  Lipase 46  Last colonoscopy January 2018  Last upper endoscopy December 2021  No pathology reports of small bowel Bx, no celiac Ab located  She recalls her most recent hemoglobin A1c 7.5, and the one prior to that 7.1  Radiologic Studies:  None recent  Gastric emptying study January 2022 with delayed emptying.  Assessment: Encounter Diagnoses  Name Primary?   Chronic diarrhea Yes   Abdominal bloating    Gastroparesis    Incontinence of feces with fecal urgency     Gastroparesis based on findings of retained food on upper endoscopy as well as subsequent GES.  Unknown if she has had previously poor control diabetes to lead to gastroparesis (based on available records), but must also consider this could have been the effect of GLP-1 agonist medication.  Nevertheless, she has apparently not tolerated prokinetic agent well.  She manages this with dietary changes.  Most troublesome thing to her is chronic diarrhea.  She has been given a diagnosis of IBS, but she has also not been ruled out for microscopic colitis or celiac disease or IBD.  Her brief but significant improvement on rifaximin suggest possible SIBO, but it is unusual that her symptoms recurred almost immediately after stopping the medicine.  She also has a history of colon polyps (unknown pathology).  She recalls Dr. Laural Golden had planned a colonoscopy but never got done.  Plan:  As needed use of  Imodium  Celiac antibody.  If positive, upper endoscopy will be needed for confirmation of diagnosis.  Colonoscopy with biopsies to rule out microscopic colitis.  She was agreeable after discussion of procedure and risks.  The benefits and risks of the planned procedure were described in detail with the patient or (when appropriate) their health care proxy.  Risks were outlined as including, but not limited to, bleeding, infection, perforation, adverse medication reaction leading to cardiac or pulmonary decompensation, pancreatitis (if ERCP).  The limitation of incomplete mucosal visualization was also discussed.  No guarantees or warranties were given.  She has some domperidone at home and will take it prior to her bowel prep doses for better tolerance.  If above studies unrevealing, lactulose breath testing.   60 minutes were spent on this encounter (including chart review, history/exam, counseling/coordination of care, and documentation) > 50% of that time was spent on counseling and coordination of care. (Extensive record review required and lengthy discussion of chronic symptoms with transfer of care to new provider)  Thank you for the courtesy of this consult.  Please call me with any questions or concerns.  Nelida Meuse III  CC: Referring provider noted above

## 2022-02-18 NOTE — Telephone Encounter (Signed)
Patient Advocate Encounter   Received notification that prior authorization for Suprep Bowel Prep Kit is required/requested.   PA submitted on 02/18/22 to [facility] via CoverMyMeds Key [id number] Status is pending

## 2022-02-18 NOTE — Patient Instructions (Signed)
_______________________________________________________  If you are age 74 or older, your body mass index should be between 23-30. Your Body mass index is 29.35 kg/m. If this is out of the aforementioned range listed, please consider follow up with your Primary Care Provider.  If you are age 20 or younger, your body mass index should be between 19-25. Your Body mass index is 29.35 kg/m. If this is out of the aformentioned range listed, please consider follow up with your Primary Care Provider.   ________________________________________________________  The [facility] providers would like to encourage you to use [facility] to communicate with providers for non-urgent requests or questions.  Due to long hold times on the telephone, sending your provider a message by [facility] may be a faster and more efficient way to get a response.  Please allow 48 business hours for a response.  Please remember that this is for non-urgent requests.  _______________________________________________________  Dennis Bast have been scheduled for a colonoscopy. Please follow written instructions given to you at your visit today.  Please pick up your prep supplies at the pharmacy within the next 1-3 days. If you use inhalers (even only as needed), please bring them with you on the day of your procedure.  Your provider has requested that you go to the basement level for lab work before leaving today. Press "B" on the elevator. The lab is located at the first door on the left as you exit the elevator.   Due to recent changes in healthcare laws, you may see the results of your imaging and laboratory studies on MyChart before your provider has had a chance to review them.  We understand that in some cases there may be results that are confusing or concerning to you. Not all laboratory results come back in the same time frame and the provider may be waiting for multiple results in order to interpret others.  Please give [place] 48 hours in  order for your provider to thoroughly review all the results before contacting the office for clarification of your results.

## 2022-02-19 ENCOUNTER — Other Ambulatory Visit (HOSPITAL_COMMUNITY): Payer: Self-pay

## 2022-02-19 LAB — TISSUE TRANSGLUTAMINASE, IGA: (tTG) Ab, IgA: <1 U/mL

## 2022-02-19 LAB — IGA: Immunoglobulin A: 146 mg/dL (ref 70–320)

## 2022-02-19 NOTE — Telephone Encounter (Signed)
Patient Advocate Encounter  Received notification from [facility] that the request for prior authorization for Suprep Bowel Prep Kit 17.5-3.13-1.6GM/177ML solution has been denied due to it is not listed in the preferred drug list (formulary).     The preferred drug(s), you may not have tried are: Clenpiq oral solution AND GaviLyte-C oral solution.  Key: Gretel Acre

## 2022-02-27 ENCOUNTER — Other Ambulatory Visit: Payer: Self-pay

## 2022-02-27 ENCOUNTER — Telehealth: Payer: Self-pay

## 2022-02-27 DIAGNOSIS — Z86010 Personal history of colonic polyps: Secondary | ICD-10-CM

## 2022-02-27 DIAGNOSIS — K529 Noninfective gastroenteritis and colitis, unspecified: Secondary | ICD-10-CM

## 2022-02-27 MED ORDER — NA SULFATE-K SULFATE-MG SULF 17.5-3.13-1.6 GM/177ML PO SOLN: 1.0000 | Freq: Once | ORAL | 0 refills | Status: DC

## 2022-02-27 NOTE — Telephone Encounter (Signed)
Pt calls and states her prep is not at the drug store and needs it for Tuesday , sent rx for suprep to [facility] [place] and pt instructed to get coupon off [facility] , pt verb understanding.

## 2022-03-03 ENCOUNTER — Encounter: Payer: Self-pay | Admitting: Gastroenterology

## 2022-03-03 ENCOUNTER — Ambulatory Visit (AMBULATORY_SURGERY_CENTER): Payer: Medicare Other | Admitting: Gastroenterology

## 2022-03-03 VITALS — BP 174/70 | HR 65 | Temp 97.5°F | Resp 14 | Ht 64.0 in | Wt 171.0 lb

## 2022-03-03 DIAGNOSIS — R140 Abdominal distension (gaseous): Secondary | ICD-10-CM

## 2022-03-03 DIAGNOSIS — K5730 Diverticulosis of large intestine without perforation or abscess without bleeding: Secondary | ICD-10-CM

## 2022-03-03 DIAGNOSIS — K529 Noninfective gastroenteritis and colitis, unspecified: Secondary | ICD-10-CM

## 2022-03-03 DIAGNOSIS — R197 Diarrhea, unspecified: Secondary | ICD-10-CM | POA: Diagnosis not present

## 2022-03-03 MED ORDER — SODIUM CHLORIDE 0.9 % IV SOLN: 500.0000 mL | Freq: Once | INTRAVENOUS | Status: AC

## 2022-03-03 NOTE — Progress Notes (Signed)
Called to room to assist during endoscopic procedure.  Patient ID and intended procedure confirmed with present staff. Received instructions for my participation in the procedure from the performing physician.  

## 2022-03-03 NOTE — Op Note (Signed)
[facility] Patient Name: Angela Harris Procedure Date: 03/03/2022 1:50 PM MRN: [id number] Endoscopist: Mallie Mussel L. Loletha Carrow , MD Age: 76 Referring MD:  Date of Birth: [birth year] Gender: Female Account #: [id number] Procedure:                Colonoscopy Indications:              Chronic diarrhea (clinical details in recent office                            consult note - subsequent tTG Ab negative) Medicines:                Monitored Anesthesia Care Procedure:                Pre-Anesthesia Assessment:                           - Prior to the procedure, a History and Physical                            was performed, and patient medications and                            allergies were reviewed. The patient's tolerance of                            previous anesthesia was also reviewed. The risks                            and benefits of the procedure and the sedation                            options and risks were discussed with the patient.                            All questions were answered, and informed consent                            was obtained. Prior Anticoagulants: The patient has                            taken no previous anticoagulant or antiplatelet                            agents. ASA Grade Assessment: II - A patient with                            mild systemic disease. After reviewing the risks                            and benefits, the patient was deemed in                            satisfactory condition to undergo the procedure.  After obtaining informed consent, the colonoscope                            was passed under direct vision. Throughout the                            procedure, the patient's blood pressure, pulse, and                            oxygen saturations were monitored continuously. The                            CF [id number] #[id number] was introduced through the anus                            and  advanced to the the terminal ileum, with                            identification of the appendiceal orifice and IC                            valve. The colonoscopy was performed without                            difficulty. The patient tolerated the procedure                            well. The quality of the bowel preparation was                            good. The terminal ileum, ileocecal valve,                            appendiceal orifice, and rectum were photographed. Scope In: 1:56:29 PM Scope Out: 2:11:35 PM Scope Withdrawal Time: 0 hours 11 minutes 13 seconds  Total Procedure Duration: 0 hours 15 minutes 6 seconds  Findings:                 The digital rectal exam findings include decreased                            sphincter tone.                           The terminal ileum appeared normal.                           Repeat examination of right colon under NBI                            performed.                           Multiple diverticula were found in the left colon  and right colon.                           Normal mucosa was found in the entire colon.                            Biopsies for histology were taken with a cold                            forceps from the right colon and left colon for                            evaluation of microscopic colitis.                           The exam was otherwise without abnormality on                            direct and retroflexion views. Complications:            No immediate complications. Estimated Blood Loss:     Estimated blood loss was minimal. Impression:               - Decreased sphincter tone found on digital rectal                            exam.                           - The examined portion of the ileum was normal.                           - Diverticulosis in the left colon and in the right                            colon.                           - Normal mucosa in the  entire examined colon.                            Biopsied.                           - The examination was otherwise normal on direct                            and retroflexion views. Recommendation:           - Patient has a contact number available for                            emergencies. The signs and symptoms of potential                            delayed complications were discussed with the  patient. Return to normal activities tomorrow.                            Written discharge instructions were provided to the                            patient.                           - Resume previous diet.                           - Continue present medications.                           - Await pathology results.                           - No future routine screening colonoscopy (or                            cologuard or FOBT) due to age, current guidelines                            and no polyps on this exam.                           - Return to my office at appointment to be                            scheduled. Indria Bishara L. Loletha Carrow, MD 03/03/2022 2:16:58 PM This report has been signed electronically.

## 2022-03-03 NOTE — Progress Notes (Signed)
Pt's states no medical or surgical changes since previsit or office visit. 

## 2022-03-03 NOTE — Progress Notes (Signed)
To pacu, VSS. Report to [email or web address].tb 

## 2022-03-03 NOTE — Progress Notes (Signed)
No changes to clinical history since GI office visit on 02/18/22. tTG Ab negative The patient is appropriate for an endoscopic procedure in the ambulatory setting.  - Wilfrid Lund, MD

## 2022-03-03 NOTE — Patient Instructions (Signed)
Please read handouts provided. Continue present medications. Await pathology results. Follow-up with Dr. Loletha Carrow.   YOU HAD AN ENDOSCOPIC PROCEDURE TODAY AT [facility]:   Refer to the procedure report that was given to you for any specific questions about what was found during the examination.  If the procedure report does not answer your questions, please call your gastroenterologist to clarify.  If you requested that your care partner not be given the details of your procedure findings, then the procedure report has been included in a sealed envelope for you to review at your convenience later.  YOU SHOULD EXPECT: Some feelings of bloating in the abdomen. Passage of more gas than usual.  Walking can help get rid of the air that was put into your GI tract during the procedure and reduce the bloating. If you had a lower endoscopy (such as a colonoscopy or flexible sigmoidoscopy) you may notice spotting of blood in your stool or on the toilet paper. If you underwent a bowel prep for your procedure, you may not have a normal bowel movement for a few days.  Please Note:  You might notice some irritation and congestion in your nose or some drainage.  This is from the oxygen used during your procedure.  There is no need for concern and it should clear up in a day or so.  SYMPTOMS TO REPORT IMMEDIATELY:  Following lower endoscopy (colonoscopy or flexible sigmoidoscopy):  Excessive amounts of blood in the stool  Significant tenderness or worsening of abdominal pains  Swelling of the abdomen that is new, acute  Fever of 100F or higher.  For urgent or emergent issues, a gastroenterologist can be reached at any hour by calling [phone]. Do not use MyChart messaging for urgent concerns.    DIET:  We do recommend a small meal at first, but then you may proceed to your regular diet.  Drink plenty of fluids but you should avoid alcoholic beverages for 24 hours.  ACTIVITY:  You  should plan to take it easy for the rest of today and you should NOT DRIVE or use heavy machinery until tomorrow (because of the sedation medicines used during the test).    FOLLOW UP: Our staff will call the number listed on your records the next business day following your procedure.  We will call around 7:15- 8:00 am to check on you and address any questions or concerns that you may have regarding the information given to you following your procedure. If we do not reach you, we will leave a message.     If any biopsies were taken you will be contacted by phone or by letter within the next 1-3 weeks.  Please call us at [phone] if you have not heard about the biopsies in 3 weeks.    SIGNATURES/CONFIDENTIALITY: You and/or your care partner have signed paperwork which will be entered into your electronic medical record.  These signatures attest to the fact that that the information above on your After Visit Summary has been reviewed and is understood.  Full responsibility of the confidentiality of this discharge information lies with you and/or your care-partner.

## 2022-03-04 ENCOUNTER — Telehealth: Payer: Self-pay

## 2022-03-04 NOTE — Telephone Encounter (Signed)
Left message on answering machine. 

## 2022-03-04 NOTE — Telephone Encounter (Signed)
Per 03/03/22 procedure report - Return to office at appointment to be scheduled.  Patient has been scheduled for a follow up appointment with Dr. Loletha Carrow on Thursday, 04/23/22 at 1:20 pm. Appt information sent to patient via MyChart and mailed.

## 2022-03-11 ENCOUNTER — Telehealth: Payer: Self-pay | Admitting: Pharmacy Technician

## 2022-03-11 ENCOUNTER — Telehealth: Payer: Self-pay

## 2022-03-11 ENCOUNTER — Other Ambulatory Visit (HOSPITAL_COMMUNITY): Payer: Self-pay

## 2022-03-11 MED ORDER — RIFAXIMIN 550 MG PO TABS: 550.0000 mg | ORAL_TABLET | Freq: Three times a day (TID) | ORAL | 0 refills | Status: DC

## 2022-03-11 NOTE — Telephone Encounter (Signed)
Dr, Loletha Carrow, please see note below. Thanks

## 2022-03-11 NOTE — Telephone Encounter (Signed)
Called and spoke with patient regarding pathology results and recommendations. Pt states that she will not be able to afford Rifaximin prescription. I told pt that we will see if we can try to get her patient assistance. If not, we will need to send in an alternative. I told pt that we will be in touch soon. Pt verbalized understanding.   [place], is there anyway we can submit a patient assistance form for Xifaxan? Pt will not be able to afford otherwise. Can you let me know what you find out? Thank you.

## 2022-03-11 NOTE — Telephone Encounter (Signed)
Pt is not eligible for patient assitance due to being enrolled in Medicare [facility] [employer]).

## 2022-03-11 NOTE — Telephone Encounter (Signed)
Patient Advocate Encounter  Prior Authorization for [facility] '550MG'$  has been approved.    PA# [id number] Effective dates: 1.1.23 through 12.31.24  Zaylei Mullane B. CPhT P: [phone] F: [phone]   Received notification from [facility] that prior authorization for XIFAXAN '550MG'$  is required.   PA submitted on 11.1.23 Key [id number] Status is pending    Luciano Cutter, CPhT Patient Advocate Phone: [phone]

## 2022-03-11 NOTE — Telephone Encounter (Signed)
-----   Message from Doran Stabler, MD sent at 03/09/2022  7:13 AM EDT ----- Angela Harris,   Colon biopsies normal, so no microscopic colitis.  She had been treated empirically for bacterial overgrowth by her primary care provider with samples of rifaximin, and had significant improvement in symptoms but quick recurrence after stopping it. I had considered doing breath testing to see if SIBO was her diagnosis, but the testing may be inaccurate due to her underlying gastroparesis. So I would like to treat her again empirically with rifaximin 550 mg 3 times daily for 14 days.  If that is not approved by insurance or is otherwise because prohibitive, let me know and we will try a different antibiotic regimen.  Rifaximin is preferred if feasible because it is typically more effective than other antibiotic regimens.  She has clinic follow-up with me scheduled.  - HD

## 2022-03-12 MED ORDER — METRONIDAZOLE 250 MG PO TABS: 250.0000 mg | ORAL_TABLET | Freq: Three times a day (TID) | ORAL | 0 refills | Status: AC

## 2022-03-12 NOTE — Telephone Encounter (Signed)
Inbound call from patient states she is returning a call? Please advise if needed.    Thank you

## 2022-03-12 NOTE — Telephone Encounter (Signed)
Metronidazole 250 mg tablet  1 tablet 3 times daily for 10 days Dispense 30 tablets, 0 refill  (If only 500 mg tablets are available, and change prescription accordingly so it is one half tablet with each dose)  - HD

## 2022-03-12 NOTE — Telephone Encounter (Signed)
Called and spoke with patient. I informed her that we have sent in Flagyl as an alternative. Pt has been advised to contact us if she has persistent symptoms after completing treatment. Pt verbalized understanding and had no concerns at the end of the call.

## 2022-03-13 DIAGNOSIS — E210 Primary hyperparathyroidism: Secondary | ICD-10-CM | POA: Diagnosis not present

## 2022-03-18 ENCOUNTER — Other Ambulatory Visit (HOSPITAL_COMMUNITY): Payer: Self-pay | Admitting: Surgery

## 2022-03-18 DIAGNOSIS — E210 Primary hyperparathyroidism: Secondary | ICD-10-CM

## 2022-03-22 ENCOUNTER — Encounter (INDEPENDENT_AMBULATORY_CARE_PROVIDER_SITE_OTHER): Payer: Self-pay | Admitting: Gastroenterology

## 2022-03-27 DIAGNOSIS — E210 Primary hyperparathyroidism: Secondary | ICD-10-CM | POA: Diagnosis not present

## 2022-04-15 ENCOUNTER — Ambulatory Visit (HOSPITAL_COMMUNITY)
Admission: RE | Admit: 2022-04-15 | Discharge: 2022-04-15 | Disposition: A | Discharge status: Home / Self Care | Payer: Medicare Other | Source: Ambulatory Visit | Attending: Surgery | Admitting: Surgery

## 2022-04-15 ENCOUNTER — Ambulatory Visit: Payer: Self-pay | Admitting: Surgery

## 2022-04-15 DIAGNOSIS — E210 Primary hyperparathyroidism: Secondary | ICD-10-CM | POA: Diagnosis not present

## 2022-04-15 DIAGNOSIS — E041 Nontoxic single thyroid nodule: Secondary | ICD-10-CM | POA: Diagnosis not present

## 2022-04-15 NOTE — Progress Notes (Signed)
USN confirms a right inferior parathyroid adenoma.  Will plan to proceed with outpatient surgery for minimally invasive parathyroidectomy as we discussed at the office.  Will enter orders and send to schedulers to contact patient.  Claiborne Billings - patient does have a thyroid nodule that will require follow up in one year.  Please make a note and arrange USN and TSH followed by exam at the office in one year.  Rowan, MD [facility] [place] [facility] Office: [phone]

## 2022-04-23 ENCOUNTER — Encounter: Payer: Self-pay | Admitting: Gastroenterology

## 2022-04-23 ENCOUNTER — Ambulatory Visit (INDEPENDENT_AMBULATORY_CARE_PROVIDER_SITE_OTHER): Payer: Medicare Other | Admitting: Gastroenterology

## 2022-04-23 VITALS — BP 160/82 | HR 65 | Ht 64.0 in | Wt 171.0 lb

## 2022-04-23 DIAGNOSIS — K529 Noninfective gastroenteritis and colitis, unspecified: Secondary | ICD-10-CM

## 2022-04-23 DIAGNOSIS — K638219 Small intestinal bacterial overgrowth, unspecified: Secondary | ICD-10-CM | POA: Diagnosis not present

## 2022-04-23 NOTE — Progress Notes (Signed)
[facility] Progress Note  Chief Complaint: Chronic diarrhea  Subjective  History: Summary of GI problems (outlined in my extensive office consult note of 02/18/2022): Gastroparesis.  Managed with diet and occasional use of domperidone (which patient dislikes feeling that it causes edema) Chronic bloating gas and diarrhea.  Carried a diagnosis of IBS at the time of transferring care to [place].  Primary care had recently tried her on an empiric course of rifaximin, which considerably improved his symptoms during the course of treatment, then they recurred quickly thereafter.  Further testing revealed negative celiac antibody, colonoscopy rule out microscopic colitis.  14-day course of rifaximin was prescribed, but it was cost prohibitive and the patient does not qualify for pharmaceutical company cost assistance on Medicare.  Metronidazole prescribed as an alternative.  (250 mg 3 times daily for 10 days) _________________   Angela Harris is glad to report she is feeling much better.  Her diarrhea has resolved, and she now has a BM about every other day and usually takes a single Imodium at bedtime. She feels like a new person, is able to go out to enjoy life more, and says all her friends even noticed the change.  Appetite is good and weight stable.  She felt that when taking the metronidazole it gave her a brain fog.  ROS: Cardiovascular:  no chest pain Respiratory: no dyspnea  The patient's Past Medical, Family and Social History were reviewed and are on file in the EMR.  Objective:  Med list reviewed  Current Outpatient Medications:    acetaminophen (TYLENOL) 500 MG tablet, Take 1,000 mg by mouth every 6 (six) hours as needed for mild pain., Disp: , Rfl:    amLODipine (NORVASC) 10 MG tablet, Take 10 mg by mouth daily., Disp: , Rfl:    aspirin EC 81 MG tablet, Take 1 tablet (81 mg total) by mouth at bedtime., Disp: 30 tablet, Rfl: 11   atenolol (TENORMIN) 50 MG tablet, Take 50  mg by mouth daily., Disp: , Rfl:    gabapentin (NEURONTIN) 300 MG capsule, Take 300 mg by mouth 2 (two) times daily., Disp: , Rfl:    insulin degludec (TRESIBA) 100 UNIT/ML FlexTouch Pen, Inject 76 Units into the skin daily., Disp: , Rfl:    lisinopril (PRINIVIL,ZESTRIL) 40 MG tablet, Take 40 mg by mouth daily., Disp: , Rfl:    Multiple Vitamins-Minerals (MULTIVITAMIN ADULTS PO), Take 1 tablet by mouth daily. , Disp: , Rfl:    NON FORMULARY, Imodium  1 capsule, Disp: , Rfl:    ondansetron (ZOFRAN-ODT) 4 MG disintegrating tablet, Take 4 mg by mouth every 8 (eight) hours as needed for nausea or vomiting., Disp: , Rfl:    ONE TOUCH ULTRA TEST test strip, USE ONE STRIP TO CHECK GLUCOSE 4 TIMES DAILY, Disp: 150 each, Rfl: 5   pantoprazole (PROTONIX) 40 MG tablet, Take 40 mg by mouth daily., Disp: , Rfl:    PRESCRIPTION MEDICATION, Domperidone '5mg'$  one bid prn, Disp: , Rfl:    Probiotic Product ([place]), Take 1 capsule by mouth daily., Disp: , Rfl:    rosuvastatin (CRESTOR) 5 MG tablet, Take 5 mg by mouth daily., Disp: , Rfl:    venlafaxine XR (EFFEXOR-XR) 75 MG 24 hr capsule, Take 75 mg by mouth daily with breakfast., Disp: , Rfl:    Ascorbic Acid (VITAMIN C) 1000 MG tablet, Take 1,000 mg by mouth daily. (Patient not taking: Reported on 04/23/2022), Disp: , Rfl:    Semaglutide,0.25  or 0.'5MG'$ /DOS, (OZEMPIC, 0.25 OR 0.5 MG/DOSE,) 2 MG/1.5ML SOPN, Inject 0.5 mg into the skin once a week.  (Patient not taking: Reported on 04/23/2022), Disp: , Rfl:   Current Facility-Administered Medications:    0.9 %  sodium chloride infusion, 500 mL, Intravenous, Once, Danis, Estill Cotta III, MD   Vital signs in last 24 hrs: Vitals:   04/23/22 1327  BP: (!) 160/82  Pulse: 65   Wt Readings from Last 3 Encounters:  04/23/22 171 lb (77.6 kg)  03/03/22 171 lb (77.6 kg)  02/18/22 171 lb (77.6 kg)    Physical Exam  Cardiac: Regular without murmur,  no peripheral edema Pulm: clear to auscultation  bilaterally, normal RR and effort noted Abdomen: soft, no tenderness, with active bowel sounds. No guarding or palpable hepatosplenomegaly.   Labs:   ___________________________________________ Radiologic studies:   ____________________________________________ Other: Colon biopsies negative for microscopic colitis, results on file  _____________________________________________ Assessment & Plan  Assessment: Encounter Diagnoses  Name Primary?   Chronic diarrhea Yes   Small intestinal bacterial overgrowth (SIBO)    Considerable improvement in symptoms, suggesting SIBO improved with metronidazole.   Plan: She can return to taking Imodium just as needed, and it may be there is some underlying intermittent diarrhea due to GI dysmotility from her diabetes and gastroparesis.  If she develops chronic diarrhea like before, she will contact us as she may need repeat antibiotic treatment.    Angela Harris

## 2022-04-23 NOTE — Patient Instructions (Signed)
_______________________________________________________  If you are age 74 or older, your body mass index should be between 23-30. Your Body mass index is 29.35 kg/m. If this is out of the aforementioned range listed, please consider follow up with your Primary Care Provider.  If you are age 58 or younger, your body mass index should be between 19-25. Your Body mass index is 29.35 kg/m. If this is out of the aformentioned range listed, please consider follow up with your Primary Care Provider.   ________________________________________________________  The [facility] providers would like to encourage you to use [facility] to communicate with providers for non-urgent requests or questions.  Due to long hold times on the telephone, sending your provider a message by [facility] may be a faster and more efficient way to get a response.  Please allow 48 business hours for a response.  Please remember that this is for non-urgent requests.  _______________________________________________________  It was a pleasure to see you today!  Thank you for trusting me with your gastrointestinal care!

## 2022-05-12 DIAGNOSIS — E7849 Other hyperlipidemia: Secondary | ICD-10-CM | POA: Diagnosis not present

## 2022-05-12 DIAGNOSIS — F324 Major depressive disorder, single episode, in partial remission: Secondary | ICD-10-CM | POA: Diagnosis not present

## 2022-05-12 DIAGNOSIS — E782 Mixed hyperlipidemia: Secondary | ICD-10-CM | POA: Diagnosis not present

## 2022-05-12 DIAGNOSIS — E871 Hypo-osmolality and hyponatremia: Secondary | ICD-10-CM | POA: Diagnosis not present

## 2022-05-12 DIAGNOSIS — I10 Essential (primary) hypertension: Secondary | ICD-10-CM | POA: Diagnosis not present

## 2022-05-12 DIAGNOSIS — N183 Chronic kidney disease, stage 3 unspecified: Secondary | ICD-10-CM | POA: Diagnosis not present

## 2022-05-12 DIAGNOSIS — R748 Abnormal levels of other serum enzymes: Secondary | ICD-10-CM | POA: Diagnosis not present

## 2022-05-12 DIAGNOSIS — K2100 Gastro-esophageal reflux disease with esophagitis, without bleeding: Secondary | ICD-10-CM | POA: Diagnosis not present

## 2022-05-12 DIAGNOSIS — E1122 Type 2 diabetes mellitus with diabetic chronic kidney disease: Secondary | ICD-10-CM | POA: Diagnosis not present

## 2022-05-12 DIAGNOSIS — E1165 Type 2 diabetes mellitus with hyperglycemia: Secondary | ICD-10-CM | POA: Diagnosis not present

## 2022-05-12 DIAGNOSIS — Z1231 Encounter for screening mammogram for malignant neoplasm of breast: Secondary | ICD-10-CM | POA: Diagnosis not present

## 2022-05-12 DIAGNOSIS — Z1329 Encounter for screening for other suspected endocrine disorder: Secondary | ICD-10-CM | POA: Diagnosis not present

## 2022-05-19 DIAGNOSIS — K3184 Gastroparesis: Secondary | ICD-10-CM | POA: Diagnosis not present

## 2022-05-19 DIAGNOSIS — Z91018 Allergy to other foods: Secondary | ICD-10-CM | POA: Diagnosis not present

## 2022-05-19 DIAGNOSIS — F331 Major depressive disorder, recurrent, moderate: Secondary | ICD-10-CM | POA: Diagnosis not present

## 2022-05-19 DIAGNOSIS — N1831 Chronic kidney disease, stage 3a: Secondary | ICD-10-CM | POA: Diagnosis not present

## 2022-05-19 DIAGNOSIS — D500 Iron deficiency anemia secondary to blood loss (chronic): Secondary | ICD-10-CM | POA: Diagnosis not present

## 2022-05-19 DIAGNOSIS — E1122 Type 2 diabetes mellitus with diabetic chronic kidney disease: Secondary | ICD-10-CM | POA: Diagnosis not present

## 2022-05-19 DIAGNOSIS — K2100 Gastro-esophageal reflux disease with esophagitis, without bleeding: Secondary | ICD-10-CM | POA: Diagnosis not present

## 2022-05-19 DIAGNOSIS — R5383 Other fatigue: Secondary | ICD-10-CM | POA: Diagnosis not present

## 2022-05-19 DIAGNOSIS — I10 Essential (primary) hypertension: Secondary | ICD-10-CM | POA: Diagnosis not present

## 2022-05-19 DIAGNOSIS — Z23 Encounter for immunization: Secondary | ICD-10-CM | POA: Diagnosis not present

## 2022-05-19 DIAGNOSIS — E7849 Other hyperlipidemia: Secondary | ICD-10-CM | POA: Diagnosis not present

## 2022-05-19 DIAGNOSIS — E1143 Type 2 diabetes mellitus with diabetic autonomic (poly)neuropathy: Secondary | ICD-10-CM | POA: Diagnosis not present

## 2022-06-02 NOTE — Patient Instructions (Addendum)
SURGICAL WAITING ROOM VISITATION  Patients having surgery or a procedure may have no more than 2 support people in the waiting area - these visitors may rotate.    Children under the age of 23 must have an adult with them who is not the patient.  Due to an increase in RSV and influenza rates and associated hospitalizations, children ages 48 and under may not visit patients in [place].  If the patient needs to stay at the hospital during part of their recovery, the visitor guidelines for inpatient rooms apply. Pre-op nurse will coordinate an appropriate time for 1 support person to accompany patient in pre-op.  This support person may not rotate.    Please refer to the [facility] website for the visitor guidelines for Inpatients (after your surgery is over and you are in a regular room).       Your procedure is scheduled on: 06-11-22   Report to [facility] Main Entrance    Report to admitting at           [place] AM   Call this number if you have problems the morning of surgery [phone]   Do not eat food :After Midnight.   After Midnight you may have the following liquids until _0430_____ AM/ DAY OF SURGERY   THEN NOTHING BY MOUTH  Water Non-Citrus Juices (without pulp, NO RED-Apple, White grape, White cranberry) Black Coffee (NO MILK/CREAM OR CREAMERS, sugar ok)  Clear Tea (NO MILK/CREAM OR CREAMERS, sugar ok) regular and decaf                             Plain Jell-O (NO RED)                                           Fruit ices (not with fruit pulp, NO RED)                                     Popsicles (NO RED)                                                               Sports drinks like Gatorade (NO RED)                          If you have questions, please contact your surgeon's office.   FOLLOW AND ANY ADDITIONAL PRE OP INSTRUCTIONS YOU RECEIVED FROM YOUR SURGEON'S OFFICE!!!     Oral Hygiene is also important to reduce your risk of  infection.                                    Remember - BRUSH YOUR TEETH THE MORNING OF SURGERY WITH YOUR REGULAR TOOTHPASTE  DENTURES WILL BE REMOVED PRIOR TO SURGERY PLEASE DO NOT APPLY "Poly grip" OR ADHESIVES!!!   Do NOT smoke after Midnight   Take these medicines the morning of surgery with A SIP OF  WATER: amlodipine, atenelol, gabapentin, venlafaxine, pantoprazole  DO NOT TAKE ANY ORAL DIABETIC MEDICATIONS DAY OF YOUR SURGERY How to Manage Your Diabetes Before and After Surgery  Why is it important to control my blood sugar before and after surgery? Improving blood sugar levels before and after surgery helps healing and can limit problems. A way of improving blood sugar control is eating a healthy diet by:  Eating less sugar and carbohydrates  Increasing activity/exercise  Talking with your doctor about reaching your blood sugar goals High blood sugars (greater than 180 mg/dL) can raise your risk of infections and slow your recovery, so you will need to focus on controlling your diabetes during the weeks before surgery. Make sure that the doctor who takes care of your diabetes knows about your planned surgery including the date and location.  How do I manage my blood sugar before surgery? Check your blood sugar at least 4 times a day, starting 2 days before surgery, to make sure that the level is not too high or low. Check your blood sugar the morning of your surgery when you wake up and every 2 hours until you get to the Short Stay unit. If your blood sugar is less than 70 mg/dL, you will need to treat for low blood sugar: Do not take insulin. Treat a low blood sugar (less than 70 mg/dL) with  cup of clear juice (cranberry or apple), 4 glucose tablets, OR glucose gel. Recheck blood sugar in 15 minutes after treatment (to make sure it is greater than 70 mg/dL). If your blood sugar is not greater than 70 mg/dL on recheck, call [phone] for further instructions. Report your  blood sugar to the short stay nurse when you get to Short Stay.  If you are admitted to the hospital after surgery: Your blood sugar will be checked by the staff and you will probably be given insulin after surgery (instead of oral diabetes medicines) to make sure you have good blood sugar levels. The goal for blood sugar control after surgery is 80-180 mg/dL.   WHAT DO I DO ABOUT MY DIABETES MEDICATION?  Do not take oral diabetes medicines (pills) the morning of surgery.           The day before surgery you can take all of the am dose of tresbia  THE NIGHT BEFORE SURGERY, take  0   units of tresbia  insulin.       THE MORNING OF SURGERY, take  0 units of   tresbia      insulin.  DO NOT TAKE THE FOLLOWING 7 DAYS PRIOR TO SURGERY: Ozempic, Wegovy, Rybelsus (Semaglutide), Byetta (exenatide), Bydureon (exenatide ER), Victoza, Saxenda (liraglutide), or Trulicity (dulaglutide) Mounjaro (Tirzepatide) Adlyxin (Lixisenatide), Polyethylene Glycol Loxenatide.   Bring CPAP mask and tubing day of surgery.                              You may not have any metal on your body including hair pins, jewelry, and body piercing             Do not wear make-up, lotions, powders, perfumes/cologne, or deodorant  Do not wear nail polish including gel and S&S, artificial/acrylic nails, or any other type of covering on natural nails including finger and toenails. If you have artificial nails, gel coating, etc. that needs to be removed by a nail salon please have this removed prior to surgery or surgery may need to  be canceled/ delayed if the surgeon/ anesthesia feels like they are unable to be safely monitored.   Do not shave  48 hours prior to surgery.              Do not bring valuables to the hospital. [place].   Contacts, glasses, dentures or bridgework may not be worn into surgery.   Bring small overnight bag day of surgery.   DO NOT [place]. PHARMACY WILL DISPENSE MEDICATIONS LISTED ON YOUR MEDICATION LIST TO YOU DURING YOUR ADMISSION [place]!    Patients discharged on the day of surgery will not be allowed to drive home.  Someone NEEDS to stay with you for the first 24 hours after anesthesia.   Special Instructions: Bring a copy of your healthcare power of attorney and living will documents the day of surgery if you haven't scanned them before.              Please read over the following fact sheets you were given: IF [place] [phone]   If you received a COVID test during your pre-op visit  it is requested that you wear a mask when out in public, stay away from anyone that may not be feeling well and notify your surgeon if you develop symptoms. If you test positive for Covid or have been in contact with anyone that has tested positive in the last 10 days please notify you surgeon.    [place] - Preparing for Surgery Before surgery, you can play an important role.  Because skin is not sterile, your skin needs to be as free of germs as possible.  You can reduce the number of germs on your skin by washing with CHG (chlorahexidine gluconate) soap before surgery.  CHG is an antiseptic cleaner which kills germs and bonds with the skin to continue killing germs even after washing. Please DO NOT use if you have an allergy to CHG or antibacterial soaps.  If your skin becomes reddened/irritated stop using the CHG and inform your nurse when you arrive at Short Stay. Do not shave (including legs and underarms) for at least 48 hours prior to the first CHG shower.  You may shave your face/neck. Please follow these instructions carefully:  1.  Shower with CHG Soap the night before surgery and the  morning of Surgery.  2.  If you choose to wash your hair, wash your hair first as usual with your  normal  shampoo.  3.  After you shampoo, rinse your  hair and body thoroughly to remove the  shampoo.                           4.  Use CHG as you would any other liquid soap.  You can apply chg directly  to the skin and wash                       Gently with a scrungie or clean washcloth.  5.  Apply the CHG Soap to your body ONLY FROM THE NECK DOWN.   Do not use on face/ open  Wound or open sores. Avoid contact with eyes, ears mouth and genitals (private parts).                       Wash face,  Genitals (private parts) with your normal soap.             6.  Wash thoroughly, paying special attention to the area where your surgery  will be performed.  7.  Thoroughly rinse your body with warm water from the neck down.  8.  DO NOT shower/wash with your normal soap after using and rinsing off  the CHG Soap.                9.  Pat yourself dry with a clean towel.            10.  Wear clean pajamas.            11.  Place clean sheets on your bed the night of your first shower and do not  sleep with pets. Day of Surgery : Do not apply any lotions/deodorants the morning of surgery.  Please wear clean clothes to the hospital/surgery center.  FAILURE TO FOLLOW THESE INSTRUCTIONS MAY RESULT IN THE CANCELLATION OF YOUR SURGERY PATIENT SIGNATURE_________________________________  NURSE SIGNATURE__________________________________  ________________________________________________________________________

## 2022-06-02 NOTE — Progress Notes (Addendum)
PCP - Gar Ponto, MD  Cardiologist - no  last Office visit 05-07-17 Leonia Reader, NP  CE  PPM/ICD -  Device Orders -  Rep Notified -   Chest x-ray -  09-12-21 epic EKG - Stress Test - 2019 ECHO - 2019 Cardiac Cath -   Sleep Study -  CPAP -   Fasting Blood Sugar - 120's Checks Blood Sugar __1___ times a day  Blood Thinner Instructions: Aspirin Instructions:  ERAS Protcol - PRE-SURGERY    OZEMPIC- has not taken in 3 months COVID vaccine -yes  Activity--Able to clinb a flight of stairs without SOB or CP Anesthesia review: HTN, TIA ,DM, chronic LBBB, HgbA1c 8.1  Patient denies shortness of breath, fever, cough and chest pain at PAT appointment   All instructions explained to the patient, with a verbal understanding of the material. Patient agrees to go over the instructions while at home for a better understanding. Patient also instructed to self quarantine after being tested for COVID-19. The opportunity to ask questions was provided.

## 2022-06-05 ENCOUNTER — Encounter (HOSPITAL_COMMUNITY)
Admission: RE | Admit: 2022-06-05 | Discharge: 2022-06-05 | Disposition: A | Discharge status: Home / Self Care | Payer: Medicare Other | Source: Ambulatory Visit | Attending: Surgery | Admitting: Surgery

## 2022-06-05 ENCOUNTER — Encounter (HOSPITAL_COMMUNITY): Payer: Self-pay

## 2022-06-05 ENCOUNTER — Other Ambulatory Visit: Payer: Self-pay

## 2022-06-05 ENCOUNTER — Encounter: Payer: Self-pay | Admitting: Surgery

## 2022-06-05 VITALS — BP 155/68 | HR 58 | Temp 97.8°F | Resp 16 | Ht 64.0 in | Wt 168.0 lb

## 2022-06-05 DIAGNOSIS — Z794 Long term (current) use of insulin: Secondary | ICD-10-CM | POA: Diagnosis not present

## 2022-06-05 DIAGNOSIS — I129 Hypertensive chronic kidney disease with stage 1 through stage 4 chronic kidney disease, or unspecified chronic kidney disease: Secondary | ICD-10-CM | POA: Insufficient documentation

## 2022-06-05 DIAGNOSIS — Z01812 Encounter for preprocedural laboratory examination: Secondary | ICD-10-CM | POA: Diagnosis not present

## 2022-06-05 DIAGNOSIS — N183 Chronic kidney disease, stage 3 unspecified: Secondary | ICD-10-CM | POA: Diagnosis not present

## 2022-06-05 DIAGNOSIS — I10 Essential (primary) hypertension: Secondary | ICD-10-CM

## 2022-06-05 DIAGNOSIS — E1122 Type 2 diabetes mellitus with diabetic chronic kidney disease: Secondary | ICD-10-CM | POA: Diagnosis not present

## 2022-06-05 DIAGNOSIS — E210 Primary hyperparathyroidism: Secondary | ICD-10-CM | POA: Diagnosis present

## 2022-06-05 HISTORY — DX: Personal history of urinary calculi: Z87.442

## 2022-06-05 HISTORY — DX: Unspecified asthma, uncomplicated: J45.909

## 2022-06-05 HISTORY — DX: Anxiety disorder, unspecified: F41.9

## 2022-06-05 HISTORY — DX: Pneumonia, unspecified organism: J18.9

## 2022-06-05 LAB — BASIC METABOLIC PANEL
Anion gap: 8 (ref 5–15)
BUN: 19 mg/dL (ref 8–23)
CO2: 27 mmol/L (ref 22–32)
Calcium: 10.8 mg/dL — ABNORMAL HIGH (ref 8.9–10.3)
Chloride: 102 mmol/L (ref 98–111)
Creatinine, Ser: 1.07 mg/dL — ABNORMAL HIGH (ref 0.44–1.00)
GFR, Estimated: 55 mL/min — ABNORMAL LOW (ref >60)
Glucose, Bld: 228 mg/dL — ABNORMAL HIGH (ref 70–99)
Potassium: 5.1 mmol/L (ref 3.5–5.1)
Sodium: 137 mmol/L (ref 135–145)

## 2022-06-05 LAB — HEMOGLOBIN A1C
Hgb A1c MFr Bld: 8.1 % — ABNORMAL HIGH (ref 4.8–5.6)
Mean Plasma Glucose: 185.77 mg/dL

## 2022-06-05 LAB — CBC
HCT: 46.8 % — ABNORMAL HIGH (ref 36.0–46.0)
Hemoglobin: 15.6 g/dL — ABNORMAL HIGH (ref 12.0–15.0)
MCH: 29.5 pg (ref 26.0–34.0)
MCHC: 33.3 g/dL (ref 30.0–36.0)
MCV: 88.5 fL (ref 80.0–100.0)
Platelets: 185 10*3/uL (ref 150–400)
RBC: 5.29 MIL/uL — ABNORMAL HIGH (ref 3.87–5.11)
RDW: 12.6 % (ref 11.5–15.5)
WBC: 8.6 10*3/uL (ref 4.0–10.5)
nRBC: 0 % (ref 0.0–0.2)

## 2022-06-05 LAB — GLUCOSE, CAPILLARY: Glucose-Capillary: 242 mg/dL — ABNORMAL HIGH (ref 70–99)

## 2022-06-05 NOTE — H&P (Signed)
REFERRING PHYSICIAN: Caryl Bis, MD  PROVIDER: Debraann Livingstone Charlotta Newton, MD   Chief Complaint: New Consultation (Primary hyperparathyroidism)  History of Present Illness:  Patient is referred by Dr. Gar Ponto for surgical evaluation and management of suspected primary hyperparathyroidism. Patient had been undergoing evaluation for GI complaints. She also noted fatigue. She has a history of nephrolithiasis. She has a history of osteopenia. She was noted on laboratory studies to have an elevated serum calcium level of 10.5 and subsequent laboratory studies showed an elevated intact PTH level of 86. 25 hydroxy vitamin D level was slightly low at 26.1. Patient has had no prior head or neck surgery. There is no family history of parathyroid disease. Patient underwent nuclear medicine parathyroid scanning at [facility] in [place], [place]. This study demonstrated uptake in the right inferior position consistent with a right inferior parathyroid adenoma. No additional imaging was performed. Patient presents today to review these results and for further recommendations on management of suspected primary hyperparathyroidism.  Review of Systems: A complete review of systems was obtained from the patient. I have reviewed this information and discussed as appropriate with the patient. See HPI as well for other ROS.  Review of Systems Constitutional: Positive for malaise/fatigue. HENT: Negative. Eyes: Negative. Respiratory: Positive for cough and shortness of breath. Cardiovascular: Negative. Gastrointestinal: Positive for abdominal pain and diarrhea. Genitourinary: Negative. Musculoskeletal: Positive for joint pain. Skin: Negative. Neurological: Negative. Endo/Heme/Allergies: Negative. Psychiatric/Behavioral: Negative.   Medical History: Past Medical History: Diagnosis Date Anemia Anxiety Asthma, unspecified asthma severity, unspecified whether complicated, unspecified  whether persistent Chronic kidney disease GERD (gastroesophageal reflux disease) History of cancer History of stroke Hypertension  Patient Active Problem List Diagnosis Primary hyperparathyroidism (CMS-HCC)  Past Surgical History: Procedure Laterality Date broken wrist Left cancer removed from kidney Left   No Known Allergies  Current Outpatient Medications on File Prior to Visit Medication Sig Dispense Refill metroNIDAZOLE (FLAGYL) 250 MG tablet Take by mouth acetaminophen (TYLENOL) 500 MG tablet Take by mouth amLODIPine (NORVASC) 10 MG tablet Take 10 mg by mouth once daily ascorbic acid, vitamin C, (VITAMIN C) 1000 MG tablet Take by mouth aspirin 81 MG EC tablet Take by mouth atenoloL (TENORMIN) 50 MG tablet Take 50 mg by mouth once daily gabapentin (NEURONTIN) 300 MG capsule Take 300 mg by mouth 2 (two) times daily insulin DEGLUDEC (TRESIBA FLEXTOUCH) pen injector (concentration 100 units/mL) Inject subcutaneously lisinopriL (ZESTRIL) 40 MG tablet Take 40 mg by mouth once daily ondansetron (ZOFRAN-ODT) 4 MG disintegrating tablet Take by mouth pantoprazole (PROTONIX) 40 MG DR tablet Take 40 mg by mouth once daily rosuvastatin (CRESTOR) 5 MG tablet Take 5 mg by mouth once daily semaglutide (OZEMPIC) 0.25 mg or 0.5 mg(2 mg/1.5 mL) pen injector Inject subcutaneously venlafaxine (EFFEXOR-XR) 75 MG XR capsule Take by mouth  No current facility-administered medications on file prior to visit.  Family History Problem Relation Age of Onset High blood pressure (Hypertension) Mother High blood pressure (Hypertension) Father High blood pressure (Hypertension) Brother   Social History  Tobacco Use Smoking Status Never Smokeless Tobacco Never   Social History  Socioeconomic History Marital status: Divorced Tobacco Use Smoking status: Never Smokeless tobacco: Never Substance and Sexual Activity Alcohol use: Not Currently  Objective:  Vitals: BP: (!) 160/80 Pulse:  64 Temp: 36.1 C (97 F) SpO2: 94% Weight: 78.9 kg (174 lb) Height: 162.6 cm ('5\' 4"'$ )  Body mass index is 29.87 kg/m.  Physical Exam  GENERAL APPEARANCE Comfortable, no acute issues Development: normal  Gross deformities: none  SKIN Rash, lesions, ulcers: none Induration, erythema: none Nodules: none palpable  EYES Conjunctiva and lids: normal Pupils: equal and reactive  EARS, NOSE, MOUTH, THROAT External ears: no lesion or deformity External nose: no lesion or deformity Hearing: grossly normal  NECK Symmetric: yes Trachea: midline Thyroid: no palpable nodules in the thyroid bed  CHEST Respiratory effort: normal Retraction or accessory muscle use: no Breath sounds: normal bilaterally Rales, rhonchi, wheeze: none  CARDIOVASCULAR Auscultation: regular rhythm, normal rate Murmurs: none Pulses: radial pulse 2+ palpable Lower extremity edema: none  ABDOMEN Not assessed  GENITOURINARY/RECTAL Not assessed  MUSCULOSKELETAL Station and gait: normal Digits and nails: no clubbing or cyanosis Muscle strength: grossly normal all extremities Range of motion: grossly normal all extremities Deformity: none  LYMPHATIC Cervical: none palpable Supraclavicular: none palpable  PSYCHIATRIC Oriented to person, place, and time: yes Mood and affect: normal for situation Judgment and insight: appropriate for situation   Assessment and Plan:  Primary hyperparathyroidism (CMS-HCC)  Patient is referred by her primary care physician for surgical evaluation and recommendations regarding suspected primary hyperparathyroidism.  Patient provided with a copy of "Parathyroid Surgery: Treatment for Your Parathyroid Gland Problem", published by [employer], 12 pages. Book reviewed and explained to patient during visit today.  Today we reviewed her clinical history. We reviewed her laboratory studies and her imaging studies. We discussed her symptoms. Nuclear medicine parathyroid scan  suggest a right inferior parathyroid adenoma. I would like to obtain a 24-hour urine collection for calcium as well as a ultrasound examination of the neck both to evaluate the thyroid gland and to possibly identify an enlarged parathyroid gland consistent with adenoma. Patient agrees to proceed with the studies.  Today we discussed minimally invasive parathyroid surgery. We discussed doing this as an outpatient procedure. We discussed the risk and benefits of the surgery including the potential for multi gland disease. We discussed her postoperative recovery. The patient understands and agrees to proceed.  Patient will undergo the above studies. We will contact her with those results and then make a decision regarding further management at that time.  Armandina Gemma, MD [facility] A [place] practice Office: [phone]

## 2022-06-10 NOTE — Anesthesia Preprocedure Evaluation (Signed)
Anesthesia Evaluation  Patient identified by MRN, date of birth, ID band Patient awake    Reviewed: Allergy & Precautions, NPO status , Patient's Chart, lab work & pertinent test results, reviewed documented beta blocker date and time   History of Anesthesia Complications (+) PONV and history of anesthetic complications  Airway Mallampati: II  TM Distance: >3 FB Neck ROM: Full    Dental  (+) Dental Advisory Given, Implants   Pulmonary asthma    Pulmonary exam normal breath sounds clear to auscultation       Cardiovascular hypertension, Pt. on medications and Pt. on home beta blockers Normal cardiovascular exam Rhythm:Regular Rate:Normal  EKG LBBB  TTE 2019 - Left ventricle: The cavity size was normal. Wall thickness was    increased in a pattern of mild LVH. Systolic function was normal.    The estimated ejection fraction was in the range of 60% to 65%.    Wall motion was normal; there were no regional wall motion    abnormalities. Doppler parameters are consistent with abnormal    left ventricular relaxation (grade 1 diastolic dysfunction).  - Mitral valve: There was trivial regurgitation.  - Right atrium: Central venous pressure (est): 3 mm Hg.  - Atrial septum: There was increased thickness of the septum,    consistent with lipomatous hypertrophy. No defect or patent    foramen ovale was identified.  - Tricuspid valve: There was trivial regurgitation.  - Pulmonary arteries: Systolic pressure could not be accurately    estimated.  - Pericardium, extracardiac: A prominent pericardial fat pad was    present.     Neuro/Psych  PSYCHIATRIC DISORDERS Anxiety Depression    CVA, No Residual Symptoms    GI/Hepatic Neg liver ROS,GERD  ,,  Endo/Other  diabetes, Type 2, Insulin Dependent    Renal/GU Renal InsufficiencyRenal diseaseLab Results      Component                Value               Date                       CREATININE               1.07 (H)            06/05/2022                BUN                      19                  06/05/2022                NA                       137                 06/05/2022                K                        5.1                 06/05/2022                CL  102                 06/05/2022                CO2                      27                  06/05/2022             negative genitourinary   Musculoskeletal negative musculoskeletal ROS (+)    Abdominal   Peds  Hematology negative hematology ROS (+)   Anesthesia Other Findings   Reproductive/Obstetrics                              Anesthesia Physical Anesthesia Plan  ASA: 3  Anesthesia Plan: General   Post-op Pain Management: Tylenol PO (pre-op)*   Induction: Intravenous  PONV Risk Score and Plan: 4 or greater and Midazolam, Dexamethasone, Ondansetron, Treatment may vary due to age or medical condition and Scopolamine patch - Pre-op  Airway Management Planned: Oral ETT  Additional Equipment:   Intra-op Plan:   Post-operative Plan: Extubation in OR  Informed Consent: I have reviewed the patients History and Physical, chart, labs and discussed the procedure including the risks, benefits and alternatives for the proposed anesthesia with the patient or authorized representative who has indicated his/her understanding and acceptance.     Dental advisory given  Plan Discussed with: CRNA  Anesthesia Plan Comments:         Anesthesia Quick Evaluation

## 2022-06-11 ENCOUNTER — Ambulatory Visit (HOSPITAL_COMMUNITY)
Admission: RE | Admit: 2022-06-11 | Discharge: 2022-06-11 | Disposition: A | Discharge status: Home / Self Care | Payer: Medicare Other | Source: Ambulatory Visit | Attending: Surgery | Admitting: Surgery

## 2022-06-11 ENCOUNTER — Encounter (HOSPITAL_COMMUNITY)
Admission: RE | Disposition: A | Discharge status: Home / Self Care | Payer: Self-pay | Source: Ambulatory Visit | Attending: Surgery

## 2022-06-11 ENCOUNTER — Ambulatory Visit (HOSPITAL_COMMUNITY): Payer: Medicare Other | Admitting: Physician Assistant

## 2022-06-11 ENCOUNTER — Ambulatory Visit (HOSPITAL_BASED_OUTPATIENT_CLINIC_OR_DEPARTMENT_OTHER): Payer: Medicare Other | Admitting: Anesthesiology

## 2022-06-11 ENCOUNTER — Encounter (HOSPITAL_COMMUNITY): Payer: Self-pay | Admitting: Surgery

## 2022-06-11 ENCOUNTER — Other Ambulatory Visit: Payer: Self-pay

## 2022-06-11 DIAGNOSIS — E118 Type 2 diabetes mellitus with unspecified complications: Secondary | ICD-10-CM | POA: Insufficient documentation

## 2022-06-11 DIAGNOSIS — E210 Primary hyperparathyroidism: Secondary | ICD-10-CM

## 2022-06-11 DIAGNOSIS — I447 Left bundle-branch block, unspecified: Secondary | ICD-10-CM | POA: Diagnosis not present

## 2022-06-11 DIAGNOSIS — J45909 Unspecified asthma, uncomplicated: Secondary | ICD-10-CM | POA: Insufficient documentation

## 2022-06-11 DIAGNOSIS — K219 Gastro-esophageal reflux disease without esophagitis: Secondary | ICD-10-CM | POA: Insufficient documentation

## 2022-06-11 DIAGNOSIS — E119 Type 2 diabetes mellitus without complications: Secondary | ICD-10-CM

## 2022-06-11 DIAGNOSIS — F419 Anxiety disorder, unspecified: Secondary | ICD-10-CM | POA: Insufficient documentation

## 2022-06-11 DIAGNOSIS — I10 Essential (primary) hypertension: Secondary | ICD-10-CM

## 2022-06-11 DIAGNOSIS — Z794 Long term (current) use of insulin: Secondary | ICD-10-CM | POA: Insufficient documentation

## 2022-06-11 DIAGNOSIS — N189 Chronic kidney disease, unspecified: Secondary | ICD-10-CM | POA: Insufficient documentation

## 2022-06-11 DIAGNOSIS — F32A Depression, unspecified: Secondary | ICD-10-CM | POA: Diagnosis not present

## 2022-06-11 DIAGNOSIS — I129 Hypertensive chronic kidney disease with stage 1 through stage 4 chronic kidney disease, or unspecified chronic kidney disease: Secondary | ICD-10-CM | POA: Diagnosis not present

## 2022-06-11 DIAGNOSIS — D351 Benign neoplasm of parathyroid gland: Secondary | ICD-10-CM | POA: Insufficient documentation

## 2022-06-11 HISTORY — PX: PARATHYROIDECTOMY: SHX19

## 2022-06-11 LAB — GLUCOSE, CAPILLARY
Glucose-Capillary: 80 mg/dL (ref 70–99)
Glucose-Capillary: 100 mg/dL — ABNORMAL HIGH (ref 70–99)

## 2022-06-11 SURGERY — PARATHYROIDECTOMY
Anesthesia: General | Laterality: Right

## 2022-06-11 MED ORDER — CHLORHEXIDINE GLUCONATE 0.12 % MT SOLN
15.0000 mL | Freq: Once | OROMUCOSAL | Status: AC
  Administered 2022-06-11: 15 mL via OROMUCOSAL

## 2022-06-11 MED ORDER — ORAL CARE MOUTH RINSE: 15.0000 mL | Freq: Once | OROMUCOSAL | Status: AC

## 2022-06-11 MED ORDER — BUPIVACAINE HCL 0.25 % IJ SOLN
INTRAMUSCULAR | Status: DC | PRN
  Administered 2022-06-11: 10 mL

## 2022-06-11 MED ORDER — PROPOFOL 10 MG/ML IV BOLUS
INTRAVENOUS | Status: AC
  Filled 2022-06-11: qty 20

## 2022-06-11 MED ORDER — DEXAMETHASONE SODIUM PHOSPHATE 10 MG/ML IJ SOLN
INTRAMUSCULAR | Status: DC | PRN
  Administered 2022-06-11: 4 mg via INTRAVENOUS

## 2022-06-11 MED ORDER — FENTANYL CITRATE (PF) 250 MCG/5ML IJ SOLN
INTRAMUSCULAR | Status: DC | PRN
  Administered 2022-06-11: 100 ug via INTRAVENOUS

## 2022-06-11 MED ORDER — FENTANYL CITRATE PF 50 MCG/ML IJ SOSY
PREFILLED_SYRINGE | INTRAMUSCULAR | Status: AC
  Filled 2022-06-11: qty 1

## 2022-06-11 MED ORDER — FENTANYL CITRATE (PF) 250 MCG/5ML IJ SOLN
INTRAMUSCULAR | Status: AC
  Filled 2022-06-11: qty 5

## 2022-06-11 MED ORDER — EPHEDRINE 5 MG/ML INJ
INTRAVENOUS | Status: AC
  Filled 2022-06-11: qty 5

## 2022-06-11 MED ORDER — ROCURONIUM BROMIDE 10 MG/ML (PF) SYRINGE
PREFILLED_SYRINGE | INTRAVENOUS | Status: AC
  Filled 2022-06-11: qty 10

## 2022-06-11 MED ORDER — EPHEDRINE SULFATE-NACL 50-0.9 MG/10ML-% IV SOSY
PREFILLED_SYRINGE | INTRAVENOUS | Status: DC | PRN
  Administered 2022-06-11 (×4): 10 mg via INTRAVENOUS

## 2022-06-11 MED ORDER — PHENYLEPHRINE 80 MCG/ML (10ML) SYRINGE FOR IV PUSH (FOR BLOOD PRESSURE SUPPORT)
PREFILLED_SYRINGE | INTRAVENOUS | Status: DC | PRN
  Administered 2022-06-11 (×3): 80 ug via INTRAVENOUS

## 2022-06-11 MED ORDER — ROCURONIUM BROMIDE 10 MG/ML (PF) SYRINGE
PREFILLED_SYRINGE | INTRAVENOUS | Status: DC | PRN
  Administered 2022-06-11: 60 mg via INTRAVENOUS

## 2022-06-11 MED ORDER — LIDOCAINE HCL 2 % IJ SOLN
INTRAMUSCULAR | Status: AC
  Filled 2022-06-11: qty 20

## 2022-06-11 MED ORDER — ACETAMINOPHEN 500 MG PO TABS
1000.0000 mg | ORAL_TABLET | Freq: Once | ORAL | Status: AC
  Administered 2022-06-11: 1000 mg via ORAL
  Filled 2022-06-11: qty 2

## 2022-06-11 MED ORDER — LIDOCAINE 2% (20 MG/ML) 5 ML SYRINGE
INTRAMUSCULAR | Status: DC | PRN
  Administered 2022-06-11: 60 mg via INTRAVENOUS

## 2022-06-11 MED ORDER — MIDAZOLAM HCL 2 MG/2ML IJ SOLN
INTRAMUSCULAR | Status: AC
  Filled 2022-06-11: qty 2

## 2022-06-11 MED ORDER — LACTATED RINGERS IV SOLN: INTRAVENOUS | Status: DC

## 2022-06-11 MED ORDER — TRAMADOL HCL 50 MG PO TABS: 50.0000 mg | ORAL_TABLET | Freq: Four times a day (QID) | ORAL | 0 refills | Status: AC | PRN

## 2022-06-11 MED ORDER — SCOPOLAMINE 1 MG/3DAYS TD PT72
MEDICATED_PATCH | TRANSDERMAL | Status: AC
  Administered 2022-06-11: 1.5 mg via TRANSDERMAL
  Filled 2022-06-11: qty 1

## 2022-06-11 MED ORDER — LIDOCAINE HCL (PF) 2 % IJ SOLN
INTRAMUSCULAR | Status: AC
  Filled 2022-06-11: qty 5

## 2022-06-11 MED ORDER — ONDANSETRON HCL 4 MG/2ML IJ SOLN
INTRAMUSCULAR | Status: AC
  Filled 2022-06-11: qty 2

## 2022-06-11 MED ORDER — CHLORHEXIDINE GLUCONATE CLOTH 2 % EX PADS: 6.0000 | MEDICATED_PAD | Freq: Once | CUTANEOUS | Status: DC

## 2022-06-11 MED ORDER — PROPOFOL 10 MG/ML IV BOLUS
INTRAVENOUS | Status: DC | PRN
  Administered 2022-06-11: 100 mg via INTRAVENOUS

## 2022-06-11 MED ORDER — BUPIVACAINE HCL (PF) 0.25 % IJ SOLN
INTRAMUSCULAR | Status: AC
  Filled 2022-06-11: qty 30

## 2022-06-11 MED ORDER — ONDANSETRON HCL 4 MG/2ML IJ SOLN
INTRAMUSCULAR | Status: DC | PRN
  Administered 2022-06-11: 4 mg via INTRAVENOUS

## 2022-06-11 MED ORDER — FENTANYL CITRATE PF 50 MCG/ML IJ SOSY
25.0000 ug | PREFILLED_SYRINGE | INTRAMUSCULAR | Status: DC | PRN
  Administered 2022-06-11: 50 ug via INTRAVENOUS

## 2022-06-11 MED ORDER — HEMOSTATIC AGENTS (NO CHARGE) OPTIME
TOPICAL | Status: DC | PRN
  Administered 2022-06-11: 1 via TOPICAL

## 2022-06-11 MED ORDER — 0.9 % SODIUM CHLORIDE (POUR BTL) OPTIME
TOPICAL | Status: DC | PRN
  Administered 2022-06-11: 1000 mL

## 2022-06-11 MED ORDER — DEXAMETHASONE SODIUM PHOSPHATE 10 MG/ML IJ SOLN
INTRAMUSCULAR | Status: AC
  Filled 2022-06-11: qty 1

## 2022-06-11 MED ORDER — SCOPOLAMINE 1 MG/3DAYS TD PT72: 1.0000 | MEDICATED_PATCH | TRANSDERMAL | Status: DC

## 2022-06-11 MED ORDER — CEFAZOLIN SODIUM-DEXTROSE 2-4 GM/100ML-% IV SOLN
2.0000 g | INTRAVENOUS | Status: AC
  Administered 2022-06-11: 2 g via INTRAVENOUS
  Filled 2022-06-11: qty 100

## 2022-06-11 MED ORDER — SUGAMMADEX SODIUM 200 MG/2ML IV SOLN
INTRAVENOUS | Status: DC | PRN
  Administered 2022-06-11: 100 mg via INTRAVENOUS
  Administered 2022-06-11: 200 mg via INTRAVENOUS

## 2022-06-11 SURGICAL SUPPLY — 32 items
ATTRACTOMAT 16X20 MAGNETIC DRP (DRAPES) ×1 IMPLANT
BAG COUNTER SPONGE SURGICOUNT (BAG) ×1 IMPLANT
BLADE SURG 15 STRL LF DISP TIS (BLADE) ×1 IMPLANT
BLADE SURG 15 STRL SS (BLADE) ×1
CHLORAPREP W/TINT 26 (MISCELLANEOUS) ×1 IMPLANT
CLIP TI MEDIUM 6 (CLIP) ×2 IMPLANT
CLIP TI WIDE RED SMALL 6 (CLIP) ×2 IMPLANT
COVER SURGICAL LIGHT HANDLE (MISCELLANEOUS) ×1 IMPLANT
DERMABOND ADVANCED .7 DNX12 (GAUZE/BANDAGES/DRESSINGS) ×1 IMPLANT
DRAPE LAPAROTOMY T 98X78 PEDS (DRAPES) ×1 IMPLANT
DRAPE UTILITY XL STRL (DRAPES) ×1 IMPLANT
ELECT REM PT RETURN 15FT ADLT (MISCELLANEOUS) ×1 IMPLANT
GAUZE 4X4 16PLY ~~LOC~~+RFID DBL (SPONGE) ×1 IMPLANT
GLOVE SURG ORTHO 8.0 STRL STRW (GLOVE) ×1 IMPLANT
GOWN STRL REUS W/ TWL XL LVL3 (GOWN DISPOSABLE) ×3 IMPLANT
GOWN STRL REUS W/TWL XL LVL3 (GOWN DISPOSABLE) ×3
HEMOSTAT SURGICEL 2X4 FIBR (HEMOSTASIS) ×1 IMPLANT
ILLUMINATOR WAVEGUIDE N/F (MISCELLANEOUS) IMPLANT
KIT BASIN OR (CUSTOM PROCEDURE TRAY) ×1 IMPLANT
KIT TURNOVER KIT A (KITS) IMPLANT
NDL HYPO 25X1 1.5 SAFETY (NEEDLE) ×1 IMPLANT
NEEDLE HYPO 25X1 1.5 SAFETY (NEEDLE) ×1 IMPLANT
PACK BASIC VI WITH GOWN DISP (CUSTOM PROCEDURE TRAY) ×1 IMPLANT
PENCIL SMOKE EVACUATOR (MISCELLANEOUS) ×1 IMPLANT
SHEARS HARMONIC 9CM CVD (BLADE) IMPLANT
SUT MNCRL AB 4-0 PS2 18 (SUTURE) ×1 IMPLANT
SUT VIC AB 3-0 SH 18 (SUTURE) ×1 IMPLANT
SYR BULB IRRIG 60ML STRL (SYRINGE) ×1 IMPLANT
SYR CONTROL 10ML LL (SYRINGE) ×1 IMPLANT
TOWEL OR 17X26 10 PK STRL BLUE (TOWEL DISPOSABLE) ×1 IMPLANT
TOWEL OR NON WOVEN STRL DISP B (DISPOSABLE) ×1 IMPLANT
TUBING CONNECTING 10 (TUBING) ×1 IMPLANT

## 2022-06-11 NOTE — Anesthesia Postprocedure Evaluation (Signed)
Anesthesia Post Note  Patient: Angela Harris  Procedure(s) Performed: RIGHT INFERIOR PARATHYROIDECTOMY (Right)     Patient location during evaluation: PACU Anesthesia Type: General Level of consciousness: awake and alert Pain management: pain level controlled Vital Signs Assessment: post-procedure vital signs reviewed and stable Respiratory status: spontaneous breathing, nonlabored ventilation, respiratory function stable and patient connected to nasal cannula oxygen Cardiovascular status: blood pressure returned to baseline and stable Postop Assessment: no apparent nausea or vomiting Anesthetic complications: no  No notable events documented.  Last Vitals:  Vitals:   06/11/22 0947 06/11/22 1000  BP: (!) 149/67 136/62  Pulse: 60 60  Resp: 16 12  Temp:    SpO2: 94% 94%    Last Pain:  Vitals:   06/11/22 0947  TempSrc:   PainSc: 3                  Aubreyanna Dorrough L Shamaya Kauer

## 2022-06-11 NOTE — Anesthesia Procedure Notes (Signed)
Procedure Name: Intubation Date/Time: 06/11/2022 7:35 AM  Performed by: Jenne Campus, CRNAPre-anesthesia Checklist: Patient identified, Emergency Drugs available, Suction available and Patient being monitored Patient Re-evaluated:Patient Re-evaluated prior to induction Oxygen Delivery Method: Circle System Utilized Preoxygenation: Pre-oxygenation with 100% oxygen Induction Type: IV induction Ventilation: Mask ventilation without difficulty Laryngoscope Size: Miller and 3 Grade View: Grade II Tube type: Oral Tube size: 7.0 mm Number of attempts: 1 Airway Equipment and Method: Stylet and Oral airway Placement Confirmation: ETT inserted through vocal cords under direct vision, positive ETCO2 and breath sounds checked- equal and bilateral Secured at: 20 cm Tube secured with: Tape Dental Injury: Teeth and Oropharynx as per pre-operative assessment

## 2022-06-11 NOTE — Op Note (Signed)
OPERATIVE REPORT - PARATHYROIDECTOMY  Preoperative diagnosis: Primary hyperparathyroidism  Postop diagnosis: Same  Procedure: Right inferior minimally invasive parathyroidectomy  Surgeon:  Armandina Gemma, MD  Anesthesia: General endotracheal  Estimated blood loss: Minimal  Preparation: ChloraPrep  Indications: Patient is referred by Dr. Gar Ponto for surgical evaluation and management of suspected primary hyperparathyroidism. Patient had been undergoing evaluation for GI complaints. She also noted fatigue. She has a history of nephrolithiasis. She has a history of osteopenia. She was noted on laboratory studies to have an elevated serum calcium level of 10.5 and subsequent laboratory studies showed an elevated intact PTH level of 86. 25 hydroxy vitamin D level was slightly low at 26.1. Patient has had no prior head or neck surgery. There is no family history of parathyroid disease. Patient underwent nuclear medicine parathyroid scanning at [facility] in [place], [place]. This study demonstrated uptake in the right inferior position consistent with a right inferior parathyroid adenoma. No additional imaging was performed. Patient presents today to review these results and for further recommendations on management of suspected primary hyperparathyroidism.  Procedure: The patient was prepared in the pre-operative holding area. The patient was brought to the operating room and placed in a supine position on the operating room table. Following administration of general anesthesia, the patient was positioned and then prepped and draped in the usual strict aseptic fashion. After ascertaining that an adequate level of anesthesia been achieved, a neck incision was made with a #15 blade. Dissection was carried through subcutaneous tissues and platysma. Hemostasis was obtained with the electrocautery. Skin flaps were developed circumferentially and a Weitlander retractor was placed for  exposure.  Strap muscles were incised in the midline. Strap muscles were reflected laterally exposing the thyroid lobe. With gentle blunt dissection the thyroid lobe was mobilized.  Dissection was carried posteriorly and an enlarged parathyroid gland was identified just inferior and posterior to the lower pole of the right thyroid lobe. It was gently mobilized. Vascular structures were divided between small ligaclips. Care was taken to avoid the recurrent laryngeal nerve. The parathyroid gland was completely excised. It was submitted to pathology where frozen section confirmed hypercellular parathyroid tissue consistent with adenoma.  Neck was irrigated with warm saline and good hemostasis was noted. Fibrillar was placed in the operative field. Strap muscles were approximated in the midline with interrupted 3-0 Vicryl sutures. Platysma was closed with interrupted 3-0 Vicryl sutures. Marcaine was infiltrated circumferentially. Skin was closed with a running 4-0 Monocryl subcuticular suture. Wound was washed and dried and Dermabond was applied. Patient was awakened from anesthesia and brought to the recovery room. The patient tolerated the procedure well.   Armandina Gemma, [facility] Office: [phone]

## 2022-06-11 NOTE — Discharge Instructions (Addendum)
[facility] - Dr. Todd Gerkin  THYROID & PARATHYROID SURGERY:  POST-OP INSTRUCTIONS  Always review the instruction sheet provided by the hospital nurse at discharge.  A prescription for pain medication may be sent to your pharmacy at the time of discharge.  Take your pain medication as prescribed.  If narcotic pain medicine is not needed, then you may take acetaminophen (Tylenol) or ibuprofen (Advil) as needed for pain or soreness.  Take your normal home medications as prescribed unless otherwise directed.  If you need a refill on your pain medication, please contact the office during regular business hours.  Prescriptions will not be processed by the office after 5:00PM or on weekends.  Start with a light diet upon arrival home, such as soup and crackers or toast.  Be sure to drink plenty of fluids.  Resume your normal diet the day after surgery.  Most patients will experience some swelling and bruising on the chest and neck area.  Ice packs will help for the first 48 hours after arriving home.  Swelling and bruising will take several days to resolve.   It is common to experience some constipation after surgery.  Increasing fluid intake and taking a stool softener (Colace) will usually help to prevent this problem.  A mild laxative (Milk of Magnesia or Miralax) should be taken according to package directions if there has been no bowel movement after 48 hours.  Dermabond glue covers your incision. This seals the wound and you may shower at any time. The Dermabond will remain in place for about a week.  You may gradually remove the glue when it loosens around the edges.  If you need to loosen the Dermabond for removal, apply a layer of Vaseline to the wound for 15 minutes and then remove with a Kleenex. Your sutures are under the skin and will not show - they will dissolve on their own.  You may resume light daily activities beginning the day after discharge (such as self-care,  walking, climbing stairs), gradually increasing activities as tolerated. You may have sexual intercourse when it is comfortable. Refrain from any heavy lifting or straining until approved by your doctor. You may drive when you no longer are taking prescription pain medication, you can comfortably wear a seatbelt, and you can safely maneuver your car and apply the brakes.  You will see your doctor in the office for a follow-up appointment approximately three weeks after your surgery.  Make sure that you call for this appointment within a day or two after you arrive home to insure a convenient appointment time. Please have any requested laboratory tests performed a few days prior to your office visit so that the results will be available at your follow up appointment.  WHEN TO CALL THE [facility] OFFICE: -- Fever greater than 101.5 -- Inability to urinate -- Nausea and/or vomiting - persistent -- Extreme swelling or bruising -- Continued bleeding from incision -- Increased pain, redness, or drainage from the incision -- Difficulty swallowing or breathing -- Muscle cramping or spasms -- Numbness or tingling in hands or around lips  The clinic staff is available to answer your questions during regular business hours.  Please don't hesitate to call and ask to speak to one of the nurses if you have concerns.  [facility] OFFICE: [phone] (24 hours)  Please sign up for MyChart accounts. This will allow you to communicate directly with my nurse or myself without having to call the office. It will also allow you   to view your test results. You will need to enroll in MyChart for my office ([facility]) and for the hospital ([place]).  Todd Gerkin, MD [facility] practice 

## 2022-06-11 NOTE — Transfer of Care (Signed)
Immediate Anesthesia Transfer of Care Note  Patient: Angela Harris  Procedure(s) Performed: RIGHT INFERIOR PARATHYROIDECTOMY (Right)  Patient Location: PACU  Anesthesia Type:General  Level of Consciousness: awake, oriented, and patient cooperative  Airway & Oxygen Therapy: Patient Spontanous Breathing and Patient connected to nasal cannula oxygen  Post-op Assessment: Report given to RN and Post -op Vital signs reviewed and stable  Post vital signs: Reviewed  Last Vitals:  Vitals Value Taken Time  BP 156/58 06/11/22 0839  Temp 36.5 C 06/11/22 0839  Pulse 68 06/11/22 0842  Resp 21 06/11/22 0842  SpO2 97 % 06/11/22 0842  Vitals shown include unvalidated device data.  Last Pain:  Vitals:   06/11/22 0615  TempSrc:   PainSc: 0-No pain         Complications: No notable events documented.

## 2022-06-11 NOTE — Interval H&P Note (Signed)
History and Physical Interval Note:  06/11/2022 7:02 AM  Angela Harris  has presented today for surgery, with the diagnosis of PRIMARY HYPERPARATHYROIDISM.  The various methods of treatment have been discussed with the patient and family. After consideration of risks, benefits and other options for treatment, the patient has consented to    Procedure(s): RIGHT INFERIOR PARATHYROIDECTOMY (Right) as a surgical intervention.    The patient's history has been reviewed, patient examined, no change in status, stable for surgery.  I have reviewed the patient's chart and labs.  Questions were answered to the patient's satisfaction.    Armandina Gemma, [facility] [place] practice Office: [place]

## 2022-06-12 ENCOUNTER — Encounter (HOSPITAL_COMMUNITY): Payer: Self-pay | Admitting: Surgery

## 2022-06-12 LAB — SURGICAL PATHOLOGY

## 2022-06-18 NOTE — Progress Notes (Signed)
     In response to:  General Surgery   Findings suggestive of an adenoma are/ is documented within the path report dated 06/11/22.. Based on the clinical findings below, please document any associated diagnoses/conditions.   Could you please clarify which of the following best describes the adenoma ?    (1) ruled in.  (2) ruled out.  (3) clinically not determined.  (4) other - please specify.      Supporting Information: Path report and H&P.    Please exercise your independent, professional judgment when responding.  A specific answer is not anticipated or expected.     Thank You, [place] [phone]   Parathyroid adenoma is ruled in.  This is more of an issue for pathology than it is for surgery.  This is semantics.  Please address with pathology or learn to understand that this is not an exact science for which a definitive label can be put on everything.  Thanks,  tmg  Armandina Gemma, MD [facility] A [place] practice Office: [phone]

## 2022-06-19 DIAGNOSIS — H353131 Nonexudative age-related macular degeneration, bilateral, early dry stage: Secondary | ICD-10-CM | POA: Diagnosis not present

## 2022-06-19 DIAGNOSIS — Z7984 Long term (current) use of oral hypoglycemic drugs: Secondary | ICD-10-CM | POA: Diagnosis not present

## 2022-06-19 DIAGNOSIS — Z794 Long term (current) use of insulin: Secondary | ICD-10-CM | POA: Diagnosis not present

## 2022-06-19 DIAGNOSIS — E113293 Type 2 diabetes mellitus with mild nonproliferative diabetic retinopathy without macular edema, bilateral: Secondary | ICD-10-CM | POA: Diagnosis not present

## 2022-06-19 DIAGNOSIS — E103293 Type 1 diabetes mellitus with mild nonproliferative diabetic retinopathy without macular edema, bilateral: Secondary | ICD-10-CM | POA: Diagnosis not present

## 2022-06-19 DIAGNOSIS — Z961 Presence of intraocular lens: Secondary | ICD-10-CM | POA: Diagnosis not present

## 2022-06-30 DIAGNOSIS — E210 Primary hyperparathyroidism: Secondary | ICD-10-CM | POA: Diagnosis not present

## 2022-07-20 DIAGNOSIS — Z6829 Body mass index (BMI) 29.0-29.9, adult: Secondary | ICD-10-CM | POA: Diagnosis not present

## 2022-07-20 DIAGNOSIS — L03312 Cellulitis of back [any part except buttock]: Secondary | ICD-10-CM | POA: Diagnosis not present

## 2022-07-20 DIAGNOSIS — R030 Elevated blood-pressure reading, without diagnosis of hypertension: Secondary | ICD-10-CM | POA: Diagnosis not present

## 2022-07-27 DIAGNOSIS — R110 Nausea: Secondary | ICD-10-CM | POA: Diagnosis not present

## 2022-07-27 DIAGNOSIS — R0902 Hypoxemia: Secondary | ICD-10-CM | POA: Diagnosis not present

## 2022-07-27 DIAGNOSIS — I10 Essential (primary) hypertension: Secondary | ICD-10-CM | POA: Diagnosis not present

## 2022-07-27 DIAGNOSIS — R42 Dizziness and giddiness: Secondary | ICD-10-CM | POA: Diagnosis not present

## 2022-07-27 DIAGNOSIS — K529 Noninfective gastroenteritis and colitis, unspecified: Secondary | ICD-10-CM | POA: Diagnosis not present

## 2022-07-27 DIAGNOSIS — I959 Hypotension, unspecified: Secondary | ICD-10-CM | POA: Diagnosis not present

## 2022-07-27 DIAGNOSIS — I447 Left bundle-branch block, unspecified: Secondary | ICD-10-CM | POA: Diagnosis not present

## 2022-07-27 DIAGNOSIS — E860 Dehydration: Secondary | ICD-10-CM | POA: Diagnosis not present

## 2022-09-09 DIAGNOSIS — K2100 Gastro-esophageal reflux disease with esophagitis, without bleeding: Secondary | ICD-10-CM | POA: Diagnosis not present

## 2022-09-09 DIAGNOSIS — E871 Hypo-osmolality and hyponatremia: Secondary | ICD-10-CM | POA: Diagnosis not present

## 2022-09-09 DIAGNOSIS — E039 Hypothyroidism, unspecified: Secondary | ICD-10-CM | POA: Diagnosis not present

## 2022-09-09 DIAGNOSIS — E7849 Other hyperlipidemia: Secondary | ICD-10-CM | POA: Diagnosis not present

## 2022-09-09 DIAGNOSIS — E876 Hypokalemia: Secondary | ICD-10-CM | POA: Diagnosis not present

## 2022-09-09 DIAGNOSIS — E1122 Type 2 diabetes mellitus with diabetic chronic kidney disease: Secondary | ICD-10-CM | POA: Diagnosis not present

## 2022-09-09 DIAGNOSIS — E1165 Type 2 diabetes mellitus with hyperglycemia: Secondary | ICD-10-CM | POA: Diagnosis not present

## 2022-09-09 DIAGNOSIS — N183 Chronic kidney disease, stage 3 unspecified: Secondary | ICD-10-CM | POA: Diagnosis not present

## 2022-09-16 DIAGNOSIS — E7849 Other hyperlipidemia: Secondary | ICD-10-CM | POA: Diagnosis not present

## 2022-09-16 DIAGNOSIS — F331 Major depressive disorder, recurrent, moderate: Secondary | ICD-10-CM | POA: Diagnosis not present

## 2022-09-16 DIAGNOSIS — K5792 Diverticulitis of intestine, part unspecified, without perforation or abscess without bleeding: Secondary | ICD-10-CM | POA: Diagnosis not present

## 2022-09-16 DIAGNOSIS — E1143 Type 2 diabetes mellitus with diabetic autonomic (poly)neuropathy: Secondary | ICD-10-CM | POA: Diagnosis not present

## 2022-09-16 DIAGNOSIS — Z91018 Allergy to other foods: Secondary | ICD-10-CM | POA: Diagnosis not present

## 2022-09-16 DIAGNOSIS — E1122 Type 2 diabetes mellitus with diabetic chronic kidney disease: Secondary | ICD-10-CM | POA: Diagnosis not present

## 2022-09-16 DIAGNOSIS — D500 Iron deficiency anemia secondary to blood loss (chronic): Secondary | ICD-10-CM | POA: Diagnosis not present

## 2022-09-16 DIAGNOSIS — R5383 Other fatigue: Secondary | ICD-10-CM | POA: Diagnosis not present

## 2022-09-16 DIAGNOSIS — K2100 Gastro-esophageal reflux disease with esophagitis, without bleeding: Secondary | ICD-10-CM | POA: Diagnosis not present

## 2022-09-16 DIAGNOSIS — Z0001 Encounter for general adult medical examination with abnormal findings: Secondary | ICD-10-CM | POA: Diagnosis not present

## 2022-09-16 DIAGNOSIS — I10 Essential (primary) hypertension: Secondary | ICD-10-CM | POA: Diagnosis not present

## 2022-09-16 DIAGNOSIS — Z23 Encounter for immunization: Secondary | ICD-10-CM | POA: Diagnosis not present

## 2022-09-29 DIAGNOSIS — K5732 Diverticulitis of large intestine without perforation or abscess without bleeding: Secondary | ICD-10-CM | POA: Diagnosis not present

## 2022-09-29 DIAGNOSIS — Z6829 Body mass index (BMI) 29.0-29.9, adult: Secondary | ICD-10-CM | POA: Diagnosis not present

## 2022-09-29 DIAGNOSIS — R030 Elevated blood-pressure reading, without diagnosis of hypertension: Secondary | ICD-10-CM | POA: Diagnosis not present

## 2022-10-27 DIAGNOSIS — D49511 Neoplasm of unspecified behavior of right kidney: Secondary | ICD-10-CM | POA: Diagnosis not present

## 2022-10-28 DIAGNOSIS — Z9089 Acquired absence of other organs: Secondary | ICD-10-CM | POA: Diagnosis not present

## 2022-10-28 DIAGNOSIS — Z9889 Other specified postprocedural states: Secondary | ICD-10-CM | POA: Diagnosis not present

## 2022-10-28 DIAGNOSIS — E210 Primary hyperparathyroidism: Secondary | ICD-10-CM | POA: Diagnosis not present

## 2022-11-03 DIAGNOSIS — Z9889 Other specified postprocedural states: Secondary | ICD-10-CM | POA: Diagnosis not present

## 2022-11-03 DIAGNOSIS — E210 Primary hyperparathyroidism: Secondary | ICD-10-CM | POA: Diagnosis not present

## 2022-11-03 DIAGNOSIS — Z9089 Acquired absence of other organs: Secondary | ICD-10-CM | POA: Diagnosis not present

## 2022-11-03 DIAGNOSIS — E559 Vitamin D deficiency, unspecified: Secondary | ICD-10-CM | POA: Diagnosis not present

## 2022-12-09 DIAGNOSIS — Z6829 Body mass index (BMI) 29.0-29.9, adult: Secondary | ICD-10-CM | POA: Diagnosis not present

## 2022-12-09 DIAGNOSIS — J069 Acute upper respiratory infection, unspecified: Secondary | ICD-10-CM | POA: Diagnosis not present

## 2022-12-09 DIAGNOSIS — R030 Elevated blood-pressure reading, without diagnosis of hypertension: Secondary | ICD-10-CM | POA: Diagnosis not present

## 2022-12-09 DIAGNOSIS — Z20828 Contact with and (suspected) exposure to other viral communicable diseases: Secondary | ICD-10-CM | POA: Diagnosis not present

## 2023-01-08 DIAGNOSIS — E7849 Other hyperlipidemia: Secondary | ICD-10-CM | POA: Diagnosis not present

## 2023-01-08 DIAGNOSIS — E559 Vitamin D deficiency, unspecified: Secondary | ICD-10-CM | POA: Diagnosis not present

## 2023-01-08 DIAGNOSIS — N183 Chronic kidney disease, stage 3 unspecified: Secondary | ICD-10-CM | POA: Diagnosis not present

## 2023-01-08 DIAGNOSIS — I10 Essential (primary) hypertension: Secondary | ICD-10-CM | POA: Diagnosis not present

## 2023-01-08 DIAGNOSIS — E1165 Type 2 diabetes mellitus with hyperglycemia: Secondary | ICD-10-CM | POA: Diagnosis not present

## 2023-01-08 DIAGNOSIS — E1122 Type 2 diabetes mellitus with diabetic chronic kidney disease: Secondary | ICD-10-CM | POA: Diagnosis not present

## 2023-01-12 DIAGNOSIS — E103211 Type 1 diabetes mellitus with mild nonproliferative diabetic retinopathy with macular edema, right eye: Secondary | ICD-10-CM | POA: Diagnosis not present

## 2023-01-12 DIAGNOSIS — Z794 Long term (current) use of insulin: Secondary | ICD-10-CM | POA: Diagnosis not present

## 2023-01-12 DIAGNOSIS — Z7984 Long term (current) use of oral hypoglycemic drugs: Secondary | ICD-10-CM | POA: Diagnosis not present

## 2023-01-12 DIAGNOSIS — H353131 Nonexudative age-related macular degeneration, bilateral, early dry stage: Secondary | ICD-10-CM | POA: Diagnosis not present

## 2023-01-14 DIAGNOSIS — Z9189 Other specified personal risk factors, not elsewhere classified: Secondary | ICD-10-CM | POA: Diagnosis not present

## 2023-01-14 DIAGNOSIS — K3184 Gastroparesis: Secondary | ICD-10-CM | POA: Diagnosis not present

## 2023-01-14 DIAGNOSIS — I10 Essential (primary) hypertension: Secondary | ICD-10-CM | POA: Diagnosis not present

## 2023-01-14 DIAGNOSIS — F331 Major depressive disorder, recurrent, moderate: Secondary | ICD-10-CM | POA: Diagnosis not present

## 2023-01-14 DIAGNOSIS — E1122 Type 2 diabetes mellitus with diabetic chronic kidney disease: Secondary | ICD-10-CM | POA: Diagnosis not present

## 2023-01-14 DIAGNOSIS — E1143 Type 2 diabetes mellitus with diabetic autonomic (poly)neuropathy: Secondary | ICD-10-CM | POA: Diagnosis not present

## 2023-01-14 DIAGNOSIS — Z23 Encounter for immunization: Secondary | ICD-10-CM | POA: Diagnosis not present

## 2023-01-14 DIAGNOSIS — E7849 Other hyperlipidemia: Secondary | ICD-10-CM | POA: Diagnosis not present

## 2023-01-14 DIAGNOSIS — R5383 Other fatigue: Secondary | ICD-10-CM | POA: Diagnosis not present

## 2023-01-14 DIAGNOSIS — D500 Iron deficiency anemia secondary to blood loss (chronic): Secondary | ICD-10-CM | POA: Diagnosis not present

## 2023-01-14 DIAGNOSIS — K5792 Diverticulitis of intestine, part unspecified, without perforation or abscess without bleeding: Secondary | ICD-10-CM | POA: Diagnosis not present

## 2023-01-14 DIAGNOSIS — Z91018 Allergy to other foods: Secondary | ICD-10-CM | POA: Diagnosis not present

## 2023-01-20 LAB — GLUCOSE, POCT (MANUAL RESULT ENTRY): Glucose Fasting, POC: 253 mg/dL — AB (ref 70–99)

## 2023-01-20 NOTE — Progress Notes (Signed)
Patient was seen at [facility], for health screening. Screenings completed today was Bp, and glucose screening. Her health is managed by [facility], Dr. Reuel Boom as her PCP. She had a recent appointment, no medication adjustments completed at her visit.   Today patients presents with elevated Bp readings, and elevated glucose readings. She mentioned she forgot to take her Insulin this morning. It was recommended for her to continue to follow her providers suggestions by increasing daily exercise, manage her diet, and to keep a written log for next follow up appointment. If her fasting numbers continue to consistently run high it was suggested to schedule a sooner appointment. Patient agreed.

## 2023-02-11 ENCOUNTER — Encounter: Payer: Self-pay | Admitting: *Deleted

## 2023-02-11 NOTE — Progress Notes (Signed)
Pt attended 01/20/23 screening exam where her b/p was 137/75 and her blood sugar was 235. At the event, the pt confirmed her PCP is Dr. Donzetta Sprung from [facility] and she did not identify any SDOH insecurities. Chart review indicates pt had her last general medical exam with her PCP on 09/16/22 and was last seen for an office visit at her PCP's office on 12/09/22. Pt's 09/16/22 physical exam list of topics addressed by her PCP includes Type 2 diabetes, so chart review confirms PCP is addressing pt's blood sugar control needs. (PCP past visits visible in [facility] but not PCP notes or future appt). Pt has also had ongoing surgical f/u since her parathyroid surgery in Feb, 2024. No additional health equity team support indicated at this time.

## 2023-05-11 DIAGNOSIS — E7849 Other hyperlipidemia: Secondary | ICD-10-CM | POA: Diagnosis not present

## 2023-05-11 DIAGNOSIS — E215 Disorder of parathyroid gland, unspecified: Secondary | ICD-10-CM | POA: Diagnosis not present

## 2023-05-11 DIAGNOSIS — E1165 Type 2 diabetes mellitus with hyperglycemia: Secondary | ICD-10-CM | POA: Diagnosis not present

## 2023-05-11 DIAGNOSIS — E1122 Type 2 diabetes mellitus with diabetic chronic kidney disease: Secondary | ICD-10-CM | POA: Diagnosis not present

## 2023-05-11 DIAGNOSIS — E876 Hypokalemia: Secondary | ICD-10-CM | POA: Diagnosis not present

## 2023-05-11 DIAGNOSIS — N184 Chronic kidney disease, stage 4 (severe): Secondary | ICD-10-CM | POA: Diagnosis not present

## 2023-06-08 DIAGNOSIS — E1142 Type 2 diabetes mellitus with diabetic polyneuropathy: Secondary | ICD-10-CM | POA: Diagnosis not present

## 2023-06-08 DIAGNOSIS — D519 Vitamin B12 deficiency anemia, unspecified: Secondary | ICD-10-CM | POA: Diagnosis not present

## 2023-06-08 DIAGNOSIS — K2100 Gastro-esophageal reflux disease with esophagitis, without bleeding: Secondary | ICD-10-CM | POA: Diagnosis not present

## 2023-06-08 DIAGNOSIS — E1143 Type 2 diabetes mellitus with diabetic autonomic (poly)neuropathy: Secondary | ICD-10-CM | POA: Diagnosis not present

## 2023-06-08 DIAGNOSIS — Z6829 Body mass index (BMI) 29.0-29.9, adult: Secondary | ICD-10-CM | POA: Diagnosis not present

## 2023-06-22 DIAGNOSIS — Z1231 Encounter for screening mammogram for malignant neoplasm of breast: Secondary | ICD-10-CM | POA: Diagnosis not present

## 2023-09-14 DIAGNOSIS — D519 Vitamin B12 deficiency anemia, unspecified: Secondary | ICD-10-CM | POA: Diagnosis not present

## 2023-09-14 DIAGNOSIS — Z1329 Encounter for screening for other suspected endocrine disorder: Secondary | ICD-10-CM | POA: Diagnosis not present

## 2023-09-14 DIAGNOSIS — E7849 Other hyperlipidemia: Secondary | ICD-10-CM | POA: Diagnosis not present

## 2023-09-14 DIAGNOSIS — R748 Abnormal levels of other serum enzymes: Secondary | ICD-10-CM | POA: Diagnosis not present

## 2023-09-14 DIAGNOSIS — E1143 Type 2 diabetes mellitus with diabetic autonomic (poly)neuropathy: Secondary | ICD-10-CM | POA: Diagnosis not present

## 2023-09-14 DIAGNOSIS — E1142 Type 2 diabetes mellitus with diabetic polyneuropathy: Secondary | ICD-10-CM | POA: Diagnosis not present

## 2023-09-14 DIAGNOSIS — E1122 Type 2 diabetes mellitus with diabetic chronic kidney disease: Secondary | ICD-10-CM | POA: Diagnosis not present

## 2023-09-21 DIAGNOSIS — Z0000 Encounter for general adult medical examination without abnormal findings: Secondary | ICD-10-CM | POA: Diagnosis not present

## 2023-09-21 DIAGNOSIS — E1142 Type 2 diabetes mellitus with diabetic polyneuropathy: Secondary | ICD-10-CM | POA: Diagnosis not present

## 2023-09-21 DIAGNOSIS — Z0001 Encounter for general adult medical examination with abnormal findings: Secondary | ICD-10-CM | POA: Diagnosis not present

## 2023-09-21 DIAGNOSIS — E1143 Type 2 diabetes mellitus with diabetic autonomic (poly)neuropathy: Secondary | ICD-10-CM | POA: Diagnosis not present

## 2023-09-21 DIAGNOSIS — D519 Vitamin B12 deficiency anemia, unspecified: Secondary | ICD-10-CM | POA: Diagnosis not present

## 2023-09-21 DIAGNOSIS — Z1331 Encounter for screening for depression: Secondary | ICD-10-CM | POA: Diagnosis not present

## 2023-09-21 DIAGNOSIS — Z1389 Encounter for screening for other disorder: Secondary | ICD-10-CM | POA: Diagnosis not present

## 2023-09-21 DIAGNOSIS — Z6829 Body mass index (BMI) 29.0-29.9, adult: Secondary | ICD-10-CM | POA: Diagnosis not present

## 2023-09-21 DIAGNOSIS — K2100 Gastro-esophageal reflux disease with esophagitis, without bleeding: Secondary | ICD-10-CM | POA: Diagnosis not present

## 2023-10-12 IMAGING — CR DG CHEST 2V
2 series · 2 of 2 positions shown · non-contrast
Comparison: September 05, 2020

CLINICAL DATA: [place] malignant neoplasm of the left kidney.

EXAM:
CHEST - 2 VIEW

[w chest pa]
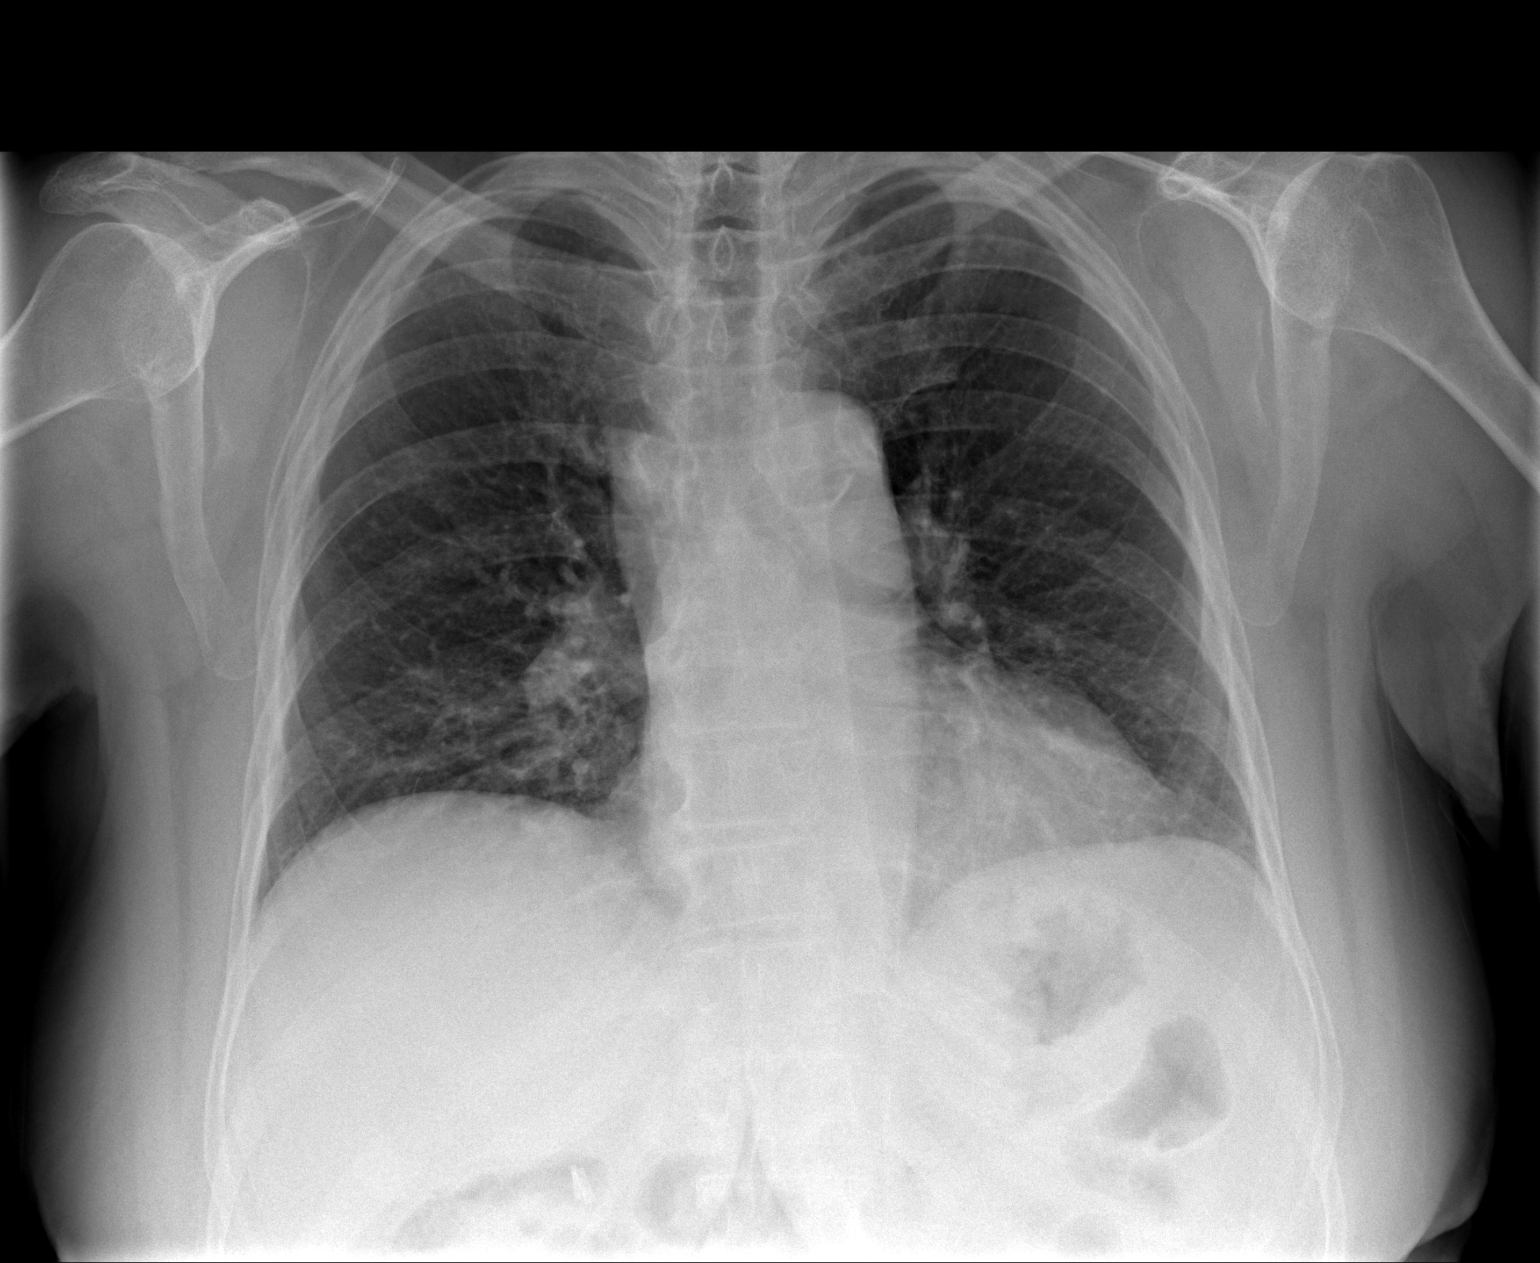

[w chest lat]
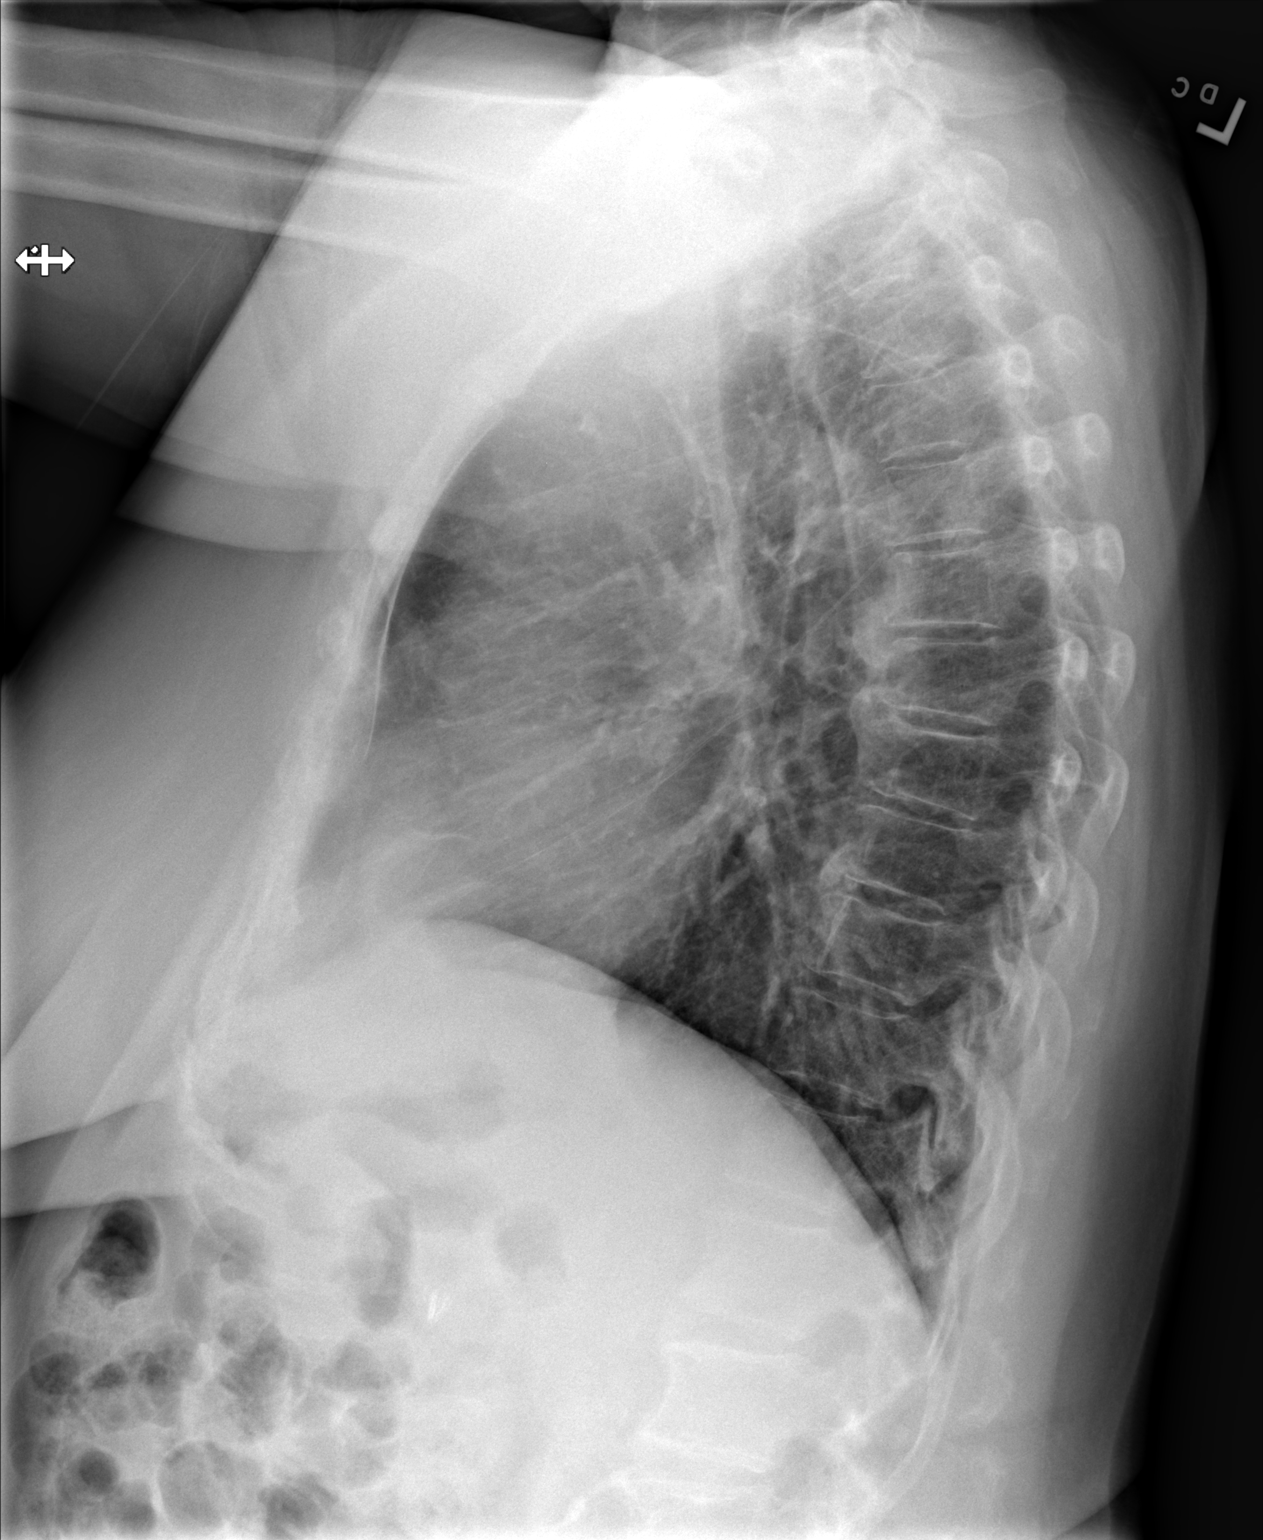

[2 of 2 positions shown; findings below may reference images not displayed]

FINDINGS: The heart size and mediastinal contours are within normal limits.
Both lungs are clear. The visualized skeletal structures are stable.
Degenerative joint changes of the spine are identified.
IMPRESSION: No active cardiopulmonary disease.

## 2023-10-26 DIAGNOSIS — Z112 Encounter for screening for other bacterial diseases: Secondary | ICD-10-CM | POA: Diagnosis not present

## 2023-10-26 DIAGNOSIS — Z6828 Body mass index (BMI) 28.0-28.9, adult: Secondary | ICD-10-CM | POA: Diagnosis not present

## 2023-10-26 DIAGNOSIS — Z2089 Contact with and (suspected) exposure to other communicable diseases: Secondary | ICD-10-CM | POA: Diagnosis not present

## 2023-10-26 DIAGNOSIS — J209 Acute bronchitis, unspecified: Secondary | ICD-10-CM | POA: Diagnosis not present

## 2023-10-26 DIAGNOSIS — J029 Acute pharyngitis, unspecified: Secondary | ICD-10-CM | POA: Diagnosis not present

## 2023-10-29 DIAGNOSIS — J181 Lobar pneumonia, unspecified organism: Secondary | ICD-10-CM | POA: Diagnosis not present

## 2023-10-29 DIAGNOSIS — R051 Acute cough: Secondary | ICD-10-CM | POA: Diagnosis not present

## 2023-10-29 DIAGNOSIS — Z6828 Body mass index (BMI) 28.0-28.9, adult: Secondary | ICD-10-CM | POA: Diagnosis not present

## 2023-11-16 DIAGNOSIS — D4101 Neoplasm of uncertain behavior of right kidney: Secondary | ICD-10-CM | POA: Diagnosis not present

## 2023-11-16 DIAGNOSIS — C642 Malignant neoplasm of left kidney, except renal pelvis: Secondary | ICD-10-CM | POA: Diagnosis not present

## 2023-11-16 DIAGNOSIS — N183 Chronic kidney disease, stage 3 unspecified: Secondary | ICD-10-CM | POA: Diagnosis not present

## 2023-11-25 DIAGNOSIS — Z780 Asymptomatic menopausal state: Secondary | ICD-10-CM | POA: Diagnosis not present

## 2023-11-25 DIAGNOSIS — M8589 Other specified disorders of bone density and structure, multiple sites: Secondary | ICD-10-CM | POA: Diagnosis not present

## 2023-11-25 DIAGNOSIS — M859 Disorder of bone density and structure, unspecified: Secondary | ICD-10-CM | POA: Diagnosis not present

## 2024-01-14 DIAGNOSIS — R748 Abnormal levels of other serum enzymes: Secondary | ICD-10-CM | POA: Diagnosis not present

## 2024-01-14 DIAGNOSIS — Z0001 Encounter for general adult medical examination with abnormal findings: Secondary | ICD-10-CM | POA: Diagnosis not present

## 2024-01-14 DIAGNOSIS — E7849 Other hyperlipidemia: Secondary | ICD-10-CM | POA: Diagnosis not present

## 2024-01-14 DIAGNOSIS — E559 Vitamin D deficiency, unspecified: Secondary | ICD-10-CM | POA: Diagnosis not present

## 2024-01-14 DIAGNOSIS — E1122 Type 2 diabetes mellitus with diabetic chronic kidney disease: Secondary | ICD-10-CM | POA: Diagnosis not present

## 2024-01-14 DIAGNOSIS — N184 Chronic kidney disease, stage 4 (severe): Secondary | ICD-10-CM | POA: Diagnosis not present

## 2024-01-18 DIAGNOSIS — Z6830 Body mass index (BMI) 30.0-30.9, adult: Secondary | ICD-10-CM | POA: Diagnosis not present

## 2024-01-18 DIAGNOSIS — Z23 Encounter for immunization: Secondary | ICD-10-CM | POA: Diagnosis not present

## 2024-01-18 DIAGNOSIS — F331 Major depressive disorder, recurrent, moderate: Secondary | ICD-10-CM | POA: Diagnosis not present

## 2024-01-18 DIAGNOSIS — K2100 Gastro-esophageal reflux disease with esophagitis, without bleeding: Secondary | ICD-10-CM | POA: Diagnosis not present

## 2024-01-18 DIAGNOSIS — E1143 Type 2 diabetes mellitus with diabetic autonomic (poly)neuropathy: Secondary | ICD-10-CM | POA: Diagnosis not present

## 2024-01-18 DIAGNOSIS — D519 Vitamin B12 deficiency anemia, unspecified: Secondary | ICD-10-CM | POA: Diagnosis not present

## 2024-02-23 ENCOUNTER — Encounter (INDEPENDENT_AMBULATORY_CARE_PROVIDER_SITE_OTHER): Payer: Self-pay | Admitting: Gastroenterology

## 2024-03-29 DIAGNOSIS — J029 Acute pharyngitis, unspecified: Secondary | ICD-10-CM | POA: Diagnosis not present

## 2024-03-29 DIAGNOSIS — Z6830 Body mass index (BMI) 30.0-30.9, adult: Secondary | ICD-10-CM | POA: Diagnosis not present

## 2024-03-29 DIAGNOSIS — Z112 Encounter for screening for other bacterial diseases: Secondary | ICD-10-CM | POA: Diagnosis not present

## 2024-03-29 DIAGNOSIS — J019 Acute sinusitis, unspecified: Secondary | ICD-10-CM | POA: Diagnosis not present

## 2024-03-29 DIAGNOSIS — Z20828 Contact with and (suspected) exposure to other viral communicable diseases: Secondary | ICD-10-CM | POA: Diagnosis not present
